# Patient Record
Sex: Female | Born: 1935
Health system: Southern US, Community
[De-identification: ages and names within clinical notes are randomized; demographics above are authoritative.]

## PROBLEM LIST (undated history)

## (undated) DIAGNOSIS — I10 Essential (primary) hypertension: Secondary | ICD-10-CM

## (undated) DIAGNOSIS — Z7901 Long term (current) use of anticoagulants: Secondary | ICD-10-CM

## (undated) DIAGNOSIS — F419 Anxiety disorder, unspecified: Secondary | ICD-10-CM

## (undated) DIAGNOSIS — F0390 Unspecified dementia without behavioral disturbance: Secondary | ICD-10-CM

## (undated) DIAGNOSIS — I4821 Permanent atrial fibrillation: Secondary | ICD-10-CM

## (undated) DIAGNOSIS — I442 Atrioventricular block, complete: Secondary | ICD-10-CM

## (undated) DIAGNOSIS — H3530 Unspecified macular degeneration: Secondary | ICD-10-CM

## (undated) DIAGNOSIS — F32A Depression, unspecified: Secondary | ICD-10-CM

## (undated) DIAGNOSIS — Z950 Presence of cardiac pacemaker: Secondary | ICD-10-CM

## (undated) DIAGNOSIS — E0590 Thyrotoxicosis, unspecified without thyrotoxic crisis or storm: Secondary | ICD-10-CM

## (undated) HISTORY — PX: APPENDECTOMY: SHX54

## (undated) HISTORY — DX: Essential (primary) hypertension: I10

## (undated) HISTORY — DX: Unspecified macular degeneration: H35.30

## (undated) HISTORY — PX: TOTAL ABDOMINAL HYSTERECTOMY W/ BILATERAL SALPINGOOPHORECTOMY: SHX83

## (undated) HISTORY — DX: Long term (current) use of anticoagulants: Z79.01

## (undated) HISTORY — DX: Thyrotoxicosis, unspecified without thyrotoxic crisis or storm: E05.90

## (undated) HISTORY — PX: ABDOMINAL HYSTERECTOMY: SHX81

---

## 1991-01-09 DIAGNOSIS — I4891 Unspecified atrial fibrillation: Secondary | ICD-10-CM

## 1991-01-09 HISTORY — DX: Unspecified atrial fibrillation: I48.91

## 1998-01-08 DIAGNOSIS — E0590 Thyrotoxicosis, unspecified without thyrotoxic crisis or storm: Secondary | ICD-10-CM

## 1998-01-08 HISTORY — DX: Thyrotoxicosis, unspecified without thyrotoxic crisis or storm: E05.90

## 2000-10-01 ENCOUNTER — Encounter: Payer: Self-pay | Admitting: Family Medicine

## 2000-10-01 ENCOUNTER — Ambulatory Visit (HOSPITAL_COMMUNITY): Admission: RE | Admit: 2000-10-01 | Discharge: 2000-10-01 | Payer: Self-pay | Admitting: Family Medicine

## 2001-03-20 ENCOUNTER — Ambulatory Visit (HOSPITAL_COMMUNITY): Admission: RE | Admit: 2001-03-20 | Discharge: 2001-03-20 | Payer: Self-pay | Admitting: General Surgery

## 2001-10-07 ENCOUNTER — Ambulatory Visit (HOSPITAL_COMMUNITY): Admission: RE | Admit: 2001-10-07 | Discharge: 2001-10-07 | Payer: Self-pay | Admitting: Family Medicine

## 2001-10-07 ENCOUNTER — Encounter: Payer: Self-pay | Admitting: Family Medicine

## 2001-10-16 ENCOUNTER — Encounter: Payer: Self-pay | Admitting: Family Medicine

## 2001-10-16 ENCOUNTER — Ambulatory Visit (HOSPITAL_COMMUNITY): Admission: RE | Admit: 2001-10-16 | Discharge: 2001-10-16 | Payer: Self-pay | Admitting: Family Medicine

## 2002-08-04 ENCOUNTER — Ambulatory Visit (HOSPITAL_COMMUNITY): Admission: RE | Admit: 2002-08-04 | Discharge: 2002-08-04 | Payer: Self-pay | Admitting: Family Medicine

## 2002-08-04 ENCOUNTER — Encounter: Payer: Self-pay | Admitting: Family Medicine

## 2002-09-07 ENCOUNTER — Encounter (HOSPITAL_COMMUNITY): Admission: RE | Admit: 2002-09-07 | Discharge: 2002-10-07 | Payer: Self-pay | Admitting: Family Medicine

## 2002-10-09 ENCOUNTER — Encounter (HOSPITAL_COMMUNITY): Admission: RE | Admit: 2002-10-09 | Discharge: 2002-11-08 | Payer: Self-pay | Admitting: Family Medicine

## 2002-11-06 ENCOUNTER — Ambulatory Visit (HOSPITAL_COMMUNITY): Admission: RE | Admit: 2002-11-06 | Discharge: 2002-11-06 | Payer: Self-pay | Admitting: Family Medicine

## 2003-01-09 HISTORY — PX: COLONOSCOPY: SHX174

## 2003-09-23 ENCOUNTER — Ambulatory Visit (HOSPITAL_COMMUNITY): Admission: RE | Admit: 2003-09-23 | Discharge: 2003-09-23 | Payer: Self-pay | Admitting: Internal Medicine

## 2003-12-15 ENCOUNTER — Ambulatory Visit: Payer: Self-pay | Admitting: *Deleted

## 2004-01-05 ENCOUNTER — Ambulatory Visit: Payer: Self-pay | Admitting: *Deleted

## 2004-02-02 ENCOUNTER — Ambulatory Visit: Payer: Self-pay | Admitting: Cardiology

## 2004-02-17 ENCOUNTER — Ambulatory Visit: Payer: Self-pay | Admitting: *Deleted

## 2004-03-16 ENCOUNTER — Ambulatory Visit: Payer: Self-pay | Admitting: *Deleted

## 2004-04-14 ENCOUNTER — Ambulatory Visit: Payer: Self-pay | Admitting: *Deleted

## 2004-05-05 ENCOUNTER — Ambulatory Visit: Payer: Self-pay | Admitting: Cardiology

## 2004-06-01 ENCOUNTER — Ambulatory Visit: Payer: Self-pay | Admitting: *Deleted

## 2004-06-12 ENCOUNTER — Ambulatory Visit: Payer: Self-pay | Admitting: Orthopedic Surgery

## 2004-07-03 ENCOUNTER — Ambulatory Visit: Payer: Self-pay | Admitting: *Deleted

## 2004-07-06 ENCOUNTER — Ambulatory Visit: Payer: Self-pay | Admitting: Orthopedic Surgery

## 2004-08-03 ENCOUNTER — Ambulatory Visit: Payer: Self-pay | Admitting: Cardiology

## 2004-09-05 ENCOUNTER — Ambulatory Visit: Payer: Self-pay | Admitting: Cardiology

## 2004-10-05 ENCOUNTER — Ambulatory Visit: Payer: Self-pay | Admitting: *Deleted

## 2004-10-26 ENCOUNTER — Ambulatory Visit: Payer: Self-pay | Admitting: Cardiology

## 2004-11-27 ENCOUNTER — Ambulatory Visit: Payer: Self-pay | Admitting: Cardiology

## 2004-12-25 ENCOUNTER — Ambulatory Visit: Payer: Self-pay | Admitting: *Deleted

## 2005-02-01 ENCOUNTER — Ambulatory Visit: Payer: Self-pay | Admitting: Cardiology

## 2005-02-23 ENCOUNTER — Ambulatory Visit: Payer: Self-pay | Admitting: Cardiology

## 2005-03-26 ENCOUNTER — Ambulatory Visit: Payer: Self-pay | Admitting: Internal Medicine

## 2005-04-30 ENCOUNTER — Ambulatory Visit: Payer: Self-pay | Admitting: *Deleted

## 2005-05-28 ENCOUNTER — Ambulatory Visit: Payer: Self-pay | Admitting: *Deleted

## 2005-07-02 ENCOUNTER — Ambulatory Visit: Payer: Self-pay | Admitting: *Deleted

## 2005-08-07 ENCOUNTER — Ambulatory Visit: Payer: Self-pay | Admitting: *Deleted

## 2005-09-05 ENCOUNTER — Ambulatory Visit: Payer: Self-pay | Admitting: *Deleted

## 2005-09-18 ENCOUNTER — Ambulatory Visit (HOSPITAL_COMMUNITY): Admission: RE | Admit: 2005-09-18 | Discharge: 2005-09-18 | Payer: Self-pay | Admitting: Family Medicine

## 2005-10-03 ENCOUNTER — Ambulatory Visit: Payer: Self-pay | Admitting: Cardiology

## 2005-11-08 ENCOUNTER — Ambulatory Visit: Payer: Self-pay | Admitting: Cardiology

## 2005-12-06 ENCOUNTER — Ambulatory Visit: Payer: Self-pay | Admitting: Cardiology

## 2006-01-03 ENCOUNTER — Ambulatory Visit: Payer: Self-pay | Admitting: Cardiology

## 2006-02-07 ENCOUNTER — Ambulatory Visit: Payer: Self-pay | Admitting: Cardiology

## 2006-02-11 ENCOUNTER — Ambulatory Visit (HOSPITAL_COMMUNITY): Admission: RE | Admit: 2006-02-11 | Discharge: 2006-02-11 | Payer: Self-pay | Admitting: Family Medicine

## 2006-03-19 ENCOUNTER — Ambulatory Visit: Payer: Self-pay | Admitting: Cardiology

## 2006-04-18 ENCOUNTER — Ambulatory Visit: Payer: Self-pay | Admitting: Cardiology

## 2006-05-20 ENCOUNTER — Ambulatory Visit: Payer: Self-pay | Admitting: Cardiology

## 2006-06-25 ENCOUNTER — Ambulatory Visit: Payer: Self-pay | Admitting: Cardiology

## 2006-07-09 ENCOUNTER — Ambulatory Visit: Payer: Self-pay | Admitting: Cardiology

## 2006-08-08 ENCOUNTER — Ambulatory Visit: Payer: Self-pay | Admitting: Cardiology

## 2006-08-22 ENCOUNTER — Ambulatory Visit: Payer: Self-pay | Admitting: Cardiology

## 2006-09-19 ENCOUNTER — Ambulatory Visit: Payer: Self-pay | Admitting: Cardiology

## 2006-10-11 ENCOUNTER — Ambulatory Visit: Payer: Self-pay | Admitting: Cardiology

## 2006-10-31 ENCOUNTER — Ambulatory Visit: Payer: Self-pay | Admitting: Cardiology

## 2006-11-14 ENCOUNTER — Ambulatory Visit: Payer: Self-pay | Admitting: Cardiology

## 2006-11-29 ENCOUNTER — Ambulatory Visit: Payer: Self-pay | Admitting: Cardiology

## 2006-12-12 ENCOUNTER — Ambulatory Visit: Payer: Self-pay | Admitting: Cardiology

## 2007-01-16 ENCOUNTER — Ambulatory Visit: Payer: Self-pay | Admitting: Internal Medicine

## 2007-02-18 ENCOUNTER — Ambulatory Visit: Payer: Self-pay | Admitting: Cardiology

## 2007-02-25 ENCOUNTER — Ambulatory Visit (HOSPITAL_COMMUNITY): Admission: RE | Admit: 2007-02-25 | Discharge: 2007-02-25 | Payer: Self-pay | Admitting: Family Medicine

## 2007-03-18 ENCOUNTER — Ambulatory Visit: Payer: Self-pay | Admitting: Cardiology

## 2007-04-03 ENCOUNTER — Ambulatory Visit: Payer: Self-pay | Admitting: Cardiology

## 2007-04-17 ENCOUNTER — Ambulatory Visit: Payer: Self-pay | Admitting: Internal Medicine

## 2007-05-07 ENCOUNTER — Ambulatory Visit: Payer: Self-pay | Admitting: Cardiology

## 2007-05-08 ENCOUNTER — Encounter (INDEPENDENT_AMBULATORY_CARE_PROVIDER_SITE_OTHER): Payer: Self-pay | Admitting: *Deleted

## 2007-05-08 LAB — CONVERTED CEMR LAB
Alkaline Phosphatase: 55 units/L
CO2: 26 meq/L
Calcium: 9.6 mg/dL
Chloride: 104 meq/L
Creatinine, Ser: 0.93 mg/dL
MCV: 93.9 fL
Platelets: 211 10*3/uL
Potassium: 5 meq/L
Total Protein: 7.1 g/dL

## 2007-05-16 ENCOUNTER — Ambulatory Visit: Payer: Self-pay | Admitting: Cardiology

## 2007-06-05 ENCOUNTER — Ambulatory Visit: Payer: Self-pay | Admitting: Cardiology

## 2007-07-03 ENCOUNTER — Ambulatory Visit: Payer: Self-pay | Admitting: Cardiology

## 2007-07-31 ENCOUNTER — Ambulatory Visit: Payer: Self-pay | Admitting: Cardiology

## 2007-09-04 ENCOUNTER — Ambulatory Visit: Payer: Self-pay | Admitting: Cardiology

## 2007-10-02 ENCOUNTER — Ambulatory Visit: Payer: Self-pay | Admitting: Cardiology

## 2007-10-16 ENCOUNTER — Ambulatory Visit: Payer: Self-pay | Admitting: Cardiology

## 2007-11-13 ENCOUNTER — Ambulatory Visit: Payer: Self-pay | Admitting: Cardiology

## 2007-12-08 ENCOUNTER — Ambulatory Visit: Payer: Self-pay | Admitting: Cardiology

## 2007-12-22 ENCOUNTER — Ambulatory Visit: Payer: Self-pay | Admitting: Cardiology

## 2007-12-29 ENCOUNTER — Ambulatory Visit: Payer: Self-pay | Admitting: Cardiology

## 2008-01-19 ENCOUNTER — Ambulatory Visit: Payer: Self-pay | Admitting: Cardiology

## 2008-01-21 ENCOUNTER — Emergency Department (HOSPITAL_COMMUNITY): Admission: EM | Admit: 2008-01-21 | Discharge: 2008-01-21 | Payer: Self-pay | Admitting: Emergency Medicine

## 2008-01-21 ENCOUNTER — Encounter: Payer: Self-pay | Admitting: Cardiology

## 2008-01-21 LAB — CONVERTED CEMR LAB
Albumin: 3.7 g/dL
BUN: 10 mg/dL
Calcium: 9.4 mg/dL
Chloride: 103 meq/L
Creatinine, Ser: 0.8 mg/dL
Glucose, Bld: 93 mg/dL
HCT: 39.5 %
Hemoglobin: 13.2 g/dL
Platelets: 185 10*3/uL
Potassium: 3.2 meq/L
Prothrombin Time: 28.7 s

## 2008-01-22 ENCOUNTER — Ambulatory Visit (HOSPITAL_COMMUNITY): Admission: RE | Admit: 2008-01-22 | Discharge: 2008-01-22 | Payer: Self-pay | Admitting: Family Medicine

## 2008-01-22 ENCOUNTER — Encounter: Payer: Self-pay | Admitting: Family Medicine

## 2008-02-03 ENCOUNTER — Encounter: Payer: Self-pay | Admitting: Physician Assistant

## 2008-02-03 ENCOUNTER — Ambulatory Visit: Payer: Self-pay | Admitting: Cardiology

## 2008-02-04 ENCOUNTER — Encounter: Payer: Self-pay | Admitting: Cardiology

## 2008-02-04 ENCOUNTER — Ambulatory Visit (HOSPITAL_COMMUNITY): Admission: RE | Admit: 2008-02-04 | Discharge: 2008-02-04 | Payer: Self-pay | Admitting: Cardiology

## 2008-02-04 ENCOUNTER — Ambulatory Visit: Payer: Self-pay | Admitting: Cardiovascular Disease

## 2008-02-16 ENCOUNTER — Ambulatory Visit: Payer: Self-pay | Admitting: Cardiology

## 2008-02-17 ENCOUNTER — Ambulatory Visit: Payer: Self-pay | Admitting: Internal Medicine

## 2008-02-25 ENCOUNTER — Ambulatory Visit (HOSPITAL_COMMUNITY): Admission: RE | Admit: 2008-02-25 | Discharge: 2008-02-25 | Payer: Self-pay | Admitting: Internal Medicine

## 2008-03-15 ENCOUNTER — Ambulatory Visit: Payer: Self-pay | Admitting: Cardiology

## 2008-03-31 ENCOUNTER — Ambulatory Visit (HOSPITAL_COMMUNITY): Admission: RE | Admit: 2008-03-31 | Discharge: 2008-03-31 | Payer: Self-pay | Admitting: Family Medicine

## 2008-04-12 ENCOUNTER — Ambulatory Visit: Payer: Self-pay | Admitting: Cardiology

## 2008-05-20 ENCOUNTER — Ambulatory Visit: Payer: Self-pay | Admitting: Cardiology

## 2008-06-17 ENCOUNTER — Ambulatory Visit: Payer: Self-pay | Admitting: Cardiology

## 2008-07-15 ENCOUNTER — Ambulatory Visit: Payer: Self-pay | Admitting: Cardiology

## 2008-08-19 ENCOUNTER — Ambulatory Visit: Payer: Self-pay | Admitting: Cardiology

## 2008-08-19 ENCOUNTER — Encounter: Payer: Self-pay | Admitting: Cardiology

## 2008-08-23 ENCOUNTER — Encounter: Payer: Self-pay | Admitting: *Deleted

## 2008-09-16 ENCOUNTER — Ambulatory Visit: Payer: Self-pay | Admitting: Cardiovascular Disease

## 2008-09-16 LAB — CONVERTED CEMR LAB: POC INR: 3.5

## 2008-10-07 ENCOUNTER — Ambulatory Visit: Payer: Self-pay | Admitting: Cardiology

## 2008-11-04 ENCOUNTER — Ambulatory Visit: Payer: Self-pay | Admitting: Cardiology

## 2008-11-11 ENCOUNTER — Encounter: Payer: Self-pay | Admitting: Cardiology

## 2008-12-06 ENCOUNTER — Ambulatory Visit: Payer: Self-pay | Admitting: Cardiology

## 2008-12-06 LAB — CONVERTED CEMR LAB: POC INR: 2.7

## 2009-01-10 ENCOUNTER — Ambulatory Visit: Payer: Self-pay | Admitting: Cardiology

## 2009-01-10 LAB — CONVERTED CEMR LAB: POC INR: 2

## 2009-02-10 ENCOUNTER — Ambulatory Visit: Payer: Self-pay | Admitting: Cardiology

## 2009-03-10 ENCOUNTER — Ambulatory Visit: Payer: Self-pay | Admitting: Cardiology

## 2009-03-10 LAB — CONVERTED CEMR LAB: POC INR: 2.2

## 2009-04-11 ENCOUNTER — Ambulatory Visit: Payer: Self-pay | Admitting: Cardiology

## 2009-04-11 LAB — CONVERTED CEMR LAB: POC INR: 2.4

## 2009-04-14 ENCOUNTER — Ambulatory Visit (HOSPITAL_COMMUNITY): Admission: RE | Admit: 2009-04-14 | Discharge: 2009-04-14 | Payer: Self-pay | Admitting: Family Medicine

## 2009-04-25 ENCOUNTER — Telehealth (INDEPENDENT_AMBULATORY_CARE_PROVIDER_SITE_OTHER): Payer: Self-pay | Admitting: *Deleted

## 2009-04-27 ENCOUNTER — Ambulatory Visit (HOSPITAL_COMMUNITY): Admission: RE | Admit: 2009-04-27 | Discharge: 2009-04-27 | Payer: Self-pay | Admitting: Family Medicine

## 2009-05-09 ENCOUNTER — Ambulatory Visit: Payer: Self-pay | Admitting: Cardiology

## 2009-05-24 ENCOUNTER — Encounter (INDEPENDENT_AMBULATORY_CARE_PROVIDER_SITE_OTHER): Payer: Self-pay | Admitting: *Deleted

## 2009-06-02 ENCOUNTER — Encounter (INDEPENDENT_AMBULATORY_CARE_PROVIDER_SITE_OTHER): Payer: Self-pay | Admitting: *Deleted

## 2009-06-07 ENCOUNTER — Encounter (INDEPENDENT_AMBULATORY_CARE_PROVIDER_SITE_OTHER): Payer: Self-pay | Admitting: *Deleted

## 2009-06-08 ENCOUNTER — Encounter: Payer: Self-pay | Admitting: Cardiology

## 2009-06-13 ENCOUNTER — Ambulatory Visit: Payer: Self-pay | Admitting: Cardiology

## 2009-06-13 LAB — CONVERTED CEMR LAB: POC INR: 2.5

## 2009-06-14 ENCOUNTER — Encounter (INDEPENDENT_AMBULATORY_CARE_PROVIDER_SITE_OTHER): Payer: Self-pay | Admitting: *Deleted

## 2009-06-14 LAB — CONVERTED CEMR LAB
AST: 27 units/L (ref 0–37)
Alkaline Phosphatase: 56 units/L (ref 39–117)
BUN: 10 mg/dL (ref 6–23)
Basophils Absolute: 0 10*3/uL (ref 0.0–0.1)
CO2: 27 meq/L (ref 19–32)
Chloride: 99 meq/L (ref 96–112)
Creatinine, Ser: 0.89 mg/dL (ref 0.40–1.20)
HCT: 43.5 % (ref 36.0–46.0)
Hemoglobin: 14 g/dL (ref 12.0–15.0)
Lymphocytes Relative: 32 % (ref 12–46)
Lymphs Abs: 1.9 10*3/uL (ref 0.7–4.0)
MCHC: 32.2 g/dL (ref 30.0–36.0)
MCV: 92.9 fL (ref 78.0–100.0)
Monocytes Relative: 8 % (ref 3–12)
Neutro Abs: 3.4 10*3/uL (ref 1.7–7.7)
Neutrophils Relative %: 58 % (ref 43–77)
Platelets: 205 10*3/uL (ref 150–400)
Potassium: 5.2 meq/L (ref 3.5–5.3)
Total Bilirubin: 0.8 mg/dL (ref 0.3–1.2)
WBC: 5.8 10*3/uL (ref 4.0–10.5)

## 2009-06-17 ENCOUNTER — Encounter (INDEPENDENT_AMBULATORY_CARE_PROVIDER_SITE_OTHER): Payer: Self-pay | Admitting: *Deleted

## 2009-06-17 ENCOUNTER — Ambulatory Visit: Payer: Self-pay | Admitting: Cardiology

## 2009-06-17 LAB — CONVERTED CEMR LAB: OCCULT 3: NEGATIVE

## 2009-06-20 ENCOUNTER — Encounter (INDEPENDENT_AMBULATORY_CARE_PROVIDER_SITE_OTHER): Payer: Self-pay | Admitting: *Deleted

## 2009-06-27 ENCOUNTER — Telehealth (INDEPENDENT_AMBULATORY_CARE_PROVIDER_SITE_OTHER): Payer: Self-pay | Admitting: *Deleted

## 2009-07-13 ENCOUNTER — Ambulatory Visit: Payer: Self-pay | Admitting: Cardiovascular Disease

## 2009-07-13 ENCOUNTER — Encounter: Payer: Self-pay | Admitting: Cardiovascular Disease

## 2009-07-13 ENCOUNTER — Encounter: Payer: Self-pay | Admitting: Cardiology

## 2009-07-13 LAB — CONVERTED CEMR LAB
Calcium: 9.7 mg/dL (ref 8.4–10.5)
Creatinine, Ser: 0.86 mg/dL (ref 0.40–1.20)

## 2009-08-11 ENCOUNTER — Ambulatory Visit: Payer: Self-pay | Admitting: Cardiology

## 2009-08-11 LAB — CONVERTED CEMR LAB: POC INR: 3.1

## 2009-09-08 ENCOUNTER — Ambulatory Visit: Payer: Self-pay | Admitting: Cardiology

## 2009-09-13 ENCOUNTER — Encounter (INDEPENDENT_AMBULATORY_CARE_PROVIDER_SITE_OTHER): Payer: Self-pay | Admitting: *Deleted

## 2009-09-13 LAB — CONVERTED CEMR LAB
BUN: 14 mg/dL
BUN: 14 mg/dL (ref 6–23)
CO2: 29 meq/L
CO2: 29 meq/L (ref 19–32)
Calcium: 9.2 mg/dL
Chloride: 101 meq/L
Chloride: 101 meq/L (ref 96–112)
Glucose, Bld: 94 mg/dL (ref 70–99)
Potassium: 4.3 meq/L (ref 3.5–5.3)
Sodium: 139 meq/L

## 2009-09-14 ENCOUNTER — Encounter (INDEPENDENT_AMBULATORY_CARE_PROVIDER_SITE_OTHER): Payer: Self-pay | Admitting: *Deleted

## 2009-09-19 ENCOUNTER — Encounter (INDEPENDENT_AMBULATORY_CARE_PROVIDER_SITE_OTHER): Payer: Self-pay | Admitting: *Deleted

## 2009-10-13 ENCOUNTER — Ambulatory Visit: Payer: Self-pay | Admitting: Cardiology

## 2009-11-10 ENCOUNTER — Ambulatory Visit: Payer: Self-pay | Admitting: Cardiology

## 2009-11-10 LAB — CONVERTED CEMR LAB: POC INR: 1.9

## 2009-12-12 ENCOUNTER — Ambulatory Visit: Payer: Self-pay | Admitting: Cardiology

## 2009-12-12 LAB — CONVERTED CEMR LAB: POC INR: 1.9

## 2010-01-11 ENCOUNTER — Ambulatory Visit: Admission: RE | Admit: 2010-01-11 | Discharge: 2010-01-11 | Payer: Self-pay | Source: Home / Self Care

## 2010-01-11 LAB — CONVERTED CEMR LAB: POC INR: 2.2

## 2010-02-07 NOTE — Medication Information (Signed)
Summary: ccr-lr  Anticoagulant Therapy  Managed by: Vashti Hey, RN PCP: [facility] Supervising MD: Daleen Squibb MD, Maisie Fus Indication 1: Atrial Fibrillation (ICD-427.31) Lab Used: [facility] [place] Site: [place] INR POC 1.9  Dietary changes: no    Health status changes: no    Bleeding/hemorrhagic complications: no    Recent/future hospitalizations: no    Any changes in medication regimen? no    Recent/future dental: no  Any missed doses?: no       Is patient compliant with meds? yes       Allergies: No Known Drug Allergies  Anticoagulation Management History:      The patient is taking warfarin and comes in today for a routine follow up visit.  Positive risk factors for bleeding include an age of 16 years or older.  The bleeding index is 'intermediate risk'.  Positive CHADS2 values include History of HTN and Age > 60 years old.  The start date was 05/20/1998.  Her last INR was 2.5.  Anticoagulation responsible provider: Daleen Squibb MD, Maisie Fus.  INR POC: 1.9.  Cuvette Lot#: [id number].  Exp: 10/11.    Anticoagulation Management Assessment/Plan:      The patient's current anticoagulation dose is Coumadin 5 mg tabs: take 1 tablet by mouth once daily as directed by Coumadin clinic.  The target INR is 2 - 3.  The next INR is due 01/11/2010.  Anticoagulation instructions were given to patient.  Results were reviewed/authorized by Vashti Hey, RN.  She was notified by Vashti Hey RN.         Prior Anticoagulation Instructions: INR 1.9 Take coumadin 1 1/2 tablets tonight then resume 1 tablet once daily except 1 1/2 tablets on Tuesdays and Saturdays  Current Anticoagulation Instructions: INR 1.9 Increase coumadin to 5mg  once daily except 7.5mg  on Mondays, Wednesdays and Fridays

## 2010-02-07 NOTE — Miscellaneous (Signed)
Summary: labs bmp 09/13/2009  Clinical Lists Changes  Observations: Added new observation of CALCIUM: 9.2 mg/dL (16/10/9602 5:40) Added new observation of CREATININE: 0.97 mg/dL (98/11/9145 8:29) Added new observation of BUN: 14 mg/dL (56/21/3086 5:78) Added new observation of BG RANDOM: 94 mg/dL (46/96/2952 8:41) Added new observation of CO2 PLSM/SER: 29 meq/L (09/13/2009 8:06) Added new observation of CL SERUM: 101 meq/L (09/13/2009 8:06) Added new observation of K SERUM: 4.3 meq/L (09/13/2009 8:06) Added new observation of NA: 139 meq/L (09/13/2009 8:06)

## 2010-02-07 NOTE — Medication Information (Signed)
Summary: ccr-lr  Anticoagulant Therapy  Managed by: Vashti Hey, RN Supervising MD: Dietrich Pates MD, Molly Maduro Indication 1: Atrial Fibrillation (ICD-427.31) Lab Used: [facility] [place] Site: [place] INR POC 2.7  Dietary changes: no    Health status changes: no    Bleeding/hemorrhagic complications: no    Recent/future hospitalizations: no    Any changes in medication regimen? no    Recent/future dental: no  Any missed doses?: no       Is patient compliant with meds? yes       Anticoagulation Management History:      The patient is taking warfarin and comes in today for a routine follow up visit.  Positive risk factors for bleeding include an age of 75 years or older.  The bleeding index is 'intermediate risk'.  Positive CHADS2 values include History of HTN and Age > 89 years old.  The start date was 05/20/1998.  Anticoagulation responsible provider: Dietrich Pates MD, Molly Maduro.  INR POC: 2.7.  Cuvette Lot#: [id number].  Exp: 10/11.    Anticoagulation Management Assessment/Plan:      The patient's current anticoagulation dose is Coumadin 5 mg tabs: take 1 tablet by mouth once daily as directed by Coumadin clinic.  The target INR is 2 - 3.  The next INR is due 03/10/2009.  Anticoagulation instructions were given to patient.  Results were reviewed/authorized by Vashti Hey, RN.  She was notified by Vashti Hey RN.         Prior Anticoagulation Instructions: INR 2.0 Continue coumadin 5mg  once daily except 7.5mg  on Tuesdays and Saturdays  Current Anticoagulation Instructions: INR 2.7 Continue coumadin 5mg  once daily except 7.5mg  on Tuesdays and Saturdays

## 2010-02-07 NOTE — Miscellaneous (Signed)
Summary: HEMOCCULT CARDS 06/17/2009  Clinical Lists Changes  Observations: Added new observation of HEMOCCULT 3: NEG (06/17/2009 16:27) Added new observation of HEMOCCULT 2: NEG (06/17/2009 16:27) Added new observation of HEMOCCULT 1: NEG (06/17/2009 16:27)

## 2010-02-07 NOTE — Assessment & Plan Note (Signed)
Summary: NURSE VISIT/TMJ  Nurse Visit   Vital Signs:  Patient profile:   77 year old female Height:      65 inches Weight:      128 pounds O2 Sat:      95 % on Room air Pulse rate:   67 / minute BP sitting:   118 / 77  (left arm)  Vitals Entered By: Teressa Lower RN (July 13, 2009 1:44 PM)  O2 Flow:  Room air  Visit Type:  3 week  nurse visit Primary Provider:  [facility]   History of Present Illness: S; 3 week nurse visit B: last seen 07/13/09, started lisinopril 20mg  daily,bp diary A: denies complaints, bp diary 14 readings average sbp 125, average dbp 79, average hr  75 bmp signed by RR, no new orders R:   Current Medications (verified): 1)  Coumadin 5 Mg Tabs (Warfarin Sodium) .... Take 1 Tablet By Mouth Once Daily As Directed By Coumadin Clinic 2)  Metoprolol Succinate 25 Mg Xr24h-Tab (Metoprolol Succinate) .... Take One Tablet By Mouth Daily 3)  Alprazolam 0.5 Mg Tabs (Alprazolam) .... Take As Needed 4)  Centrum Silver  Tabs (Multiple Vitamins-Minerals) .... Take 1 Tab Daily 5)  Vitamin C Cr 500 Mg Cr-Caps (Ascorbic Acid) .... Take 1 Tab Daily 6)  Fish Oil 1000 Mg Caps (Omega-3 Fatty Acids) .... Take 1 Cap Daily 7)  Lisinopril 20 Mg Tabs (Lisinopril) .... Take One Tablet By Mouth Daily  Allergies (verified): No Known Drug Allergies

## 2010-02-07 NOTE — Medication Information (Signed)
Summary: ccr-lr  Anticoagulant Therapy  Managed by: Vashti Hey, RN PCP: [facility] Supervising MD: Eden Emms MD, Theron Arista Indication 1: Atrial Fibrillation (ICD-427.31) Lab Used: [facility] [place] Site: [place] INR POC 2.3  Dietary changes: no    Health status changes: no    Bleeding/hemorrhagic complications: no    Recent/future hospitalizations: no    Any changes in medication regimen? no    Recent/future dental: no  Any missed doses?: no       Is patient compliant with meds? yes       Allergies: No Known Drug Allergies  Anticoagulation Management History:      The patient is taking warfarin and comes in today for a routine follow up visit.  Positive risk factors for bleeding include an age of 75 years or older.  The bleeding index is 'intermediate risk'.  Positive CHADS2 values include History of HTN and Age > 10 years old.  The start date was 05/20/1998.  Her last INR was 2.5.  Anticoagulation responsible provider: Eden Emms MD, Theron Arista.  INR POC: 2.3.  Cuvette Lot#: [id number].  Exp: 10/11.    Anticoagulation Management Assessment/Plan:      The patient's current anticoagulation dose is Coumadin 5 mg tabs: take 1 tablet by mouth once daily as directed by Coumadin clinic.  The target INR is 2 - 3.  The next INR is due 08/11/2009.  Anticoagulation instructions were given to patient.  Results were reviewed/authorized by Vashti Hey, RN.  She was notified by Vashti Hey RN.         Prior Anticoagulation Instructions: INR 2.5 Continue coumadin 5mg  once daily except 7.5mg  on Tuesdays and Saturdays  Current Anticoagulation Instructions: INR 2.3 Continue coumadin 5mg  once daily except 7.5mg  on Tuesdays and Saturdays

## 2010-02-07 NOTE — Letter (Signed)
Summary: [place] [facility] [employer]  [address] [place] [address]   Phone: [phone]  Fax: [phone]     June 14, 2009 MRN: [id number]   Renee Cameron [address] [place], [place]  [address]      YOUR LAB WORK IS DUE   ______July 19, 2011___________________________________  Please go to [facility], located across the street from [facility] on the second floor.  Hours are Monday - Friday 7am until 7:30pm         Saturday 8am until 12noon    __  DO NOT EAT OR DRINK AFTER MIDNIGHT EVENING PRIOR TO LABWORK  _x_ YOUR LABWORK IS NOT FASTING --YOU MAY EAT PRIOR TO LABWORK

## 2010-02-07 NOTE — Medication Information (Signed)
Summary: CCR  Anticoagulant Therapy  Managed by: Vashti Hey, RN Supervising MD: Diona Browner MD, Remi Deter Indication 1: Atrial Fibrillation (ICD-427.31) Lab Used: [facility] [place] Site: [place] INR POC 2.2  Dietary changes: no    Health status changes: no    Bleeding/hemorrhagic complications: no    Recent/future hospitalizations: no    Any changes in medication regimen? no    Recent/future dental: no  Any missed doses?: no       Is patient compliant with meds? yes       Anticoagulation Management History:      The patient is taking warfarin and comes in today for a routine follow up visit.  Positive risk factors for bleeding include an age of 75 years or older.  The bleeding index is 'intermediate risk'.  Positive CHADS2 values include History of HTN and Age > 34 years old.  The start date was 05/20/1998.  Anticoagulation responsible provider: Diona Browner MD, Remi Deter.  INR POC: 2.2.  Cuvette Lot#: [id number].  Exp: 10/11.    Anticoagulation Management Assessment/Plan:      The patient's current anticoagulation dose is Coumadin 5 mg tabs: take 1 tablet by mouth once daily as directed by Coumadin clinic.  The target INR is 2 - 3.  The next INR is due 04/11/2009.  Anticoagulation instructions were given to patient.  Results were reviewed/authorized by Vashti Hey, RN.  She was notified by Vashti Hey RN.         Prior Anticoagulation Instructions: INR 2.7 Continue coumadin 5mg  once daily except 7.5mg  on Tuesdays and Saturdays  Current Anticoagulation Instructions: INR 2.2 Continue coumadin 5mg  once daily except 7.5mg  on Tuesdays and Saturdays

## 2010-02-07 NOTE — Medication Information (Signed)
Summary: ccr-lr   BP MUST BE TAKEN MANUALLY-NO AUTOMATIC CUFF  Anticoagulant Therapy  Managed by: Vashti Hey, RN PCP: Dr. Patrica Duel Supervising MD: Dietrich Pates MD, Molly Maduro Indication 1: Atrial Fibrillation (ICD-427.31) Lab Used: [facility] [place] Site: [place] INR POC 2.5  Dietary changes: no    Health status changes: no    Bleeding/hemorrhagic complications: no    Recent/future hospitalizations: no    Any changes in medication regimen? yes       Details: been on cefelexin 500mg  qid for nail fungus  Recent/future dental: no  Any missed doses?: no       Is patient compliant with meds? yes       Anticoagulation Management History:      The patient is taking warfarin and comes in today for a routine follow up visit.  Positive risk factors for bleeding include an age of 75 years or older.  The bleeding index is 'intermediate risk'.  Positive CHADS2 values include History of HTN and Age > 50 years old.  The start date was 05/20/1998.  Her last INR was 2.5.  Anticoagulation responsible provider: Dietrich Pates MD, Molly Maduro.  INR POC: 2.5.  Cuvette Lot#: [id number].  Exp: 10/11.    Anticoagulation Management Assessment/Plan:      The patient's current anticoagulation dose is Coumadin 5 mg tabs: take 1 tablet by mouth once daily as directed by Coumadin clinic.  The target INR is 2 - 3.  The next INR is due 07/13/2009.  Anticoagulation instructions were given to patient.  Results were reviewed/authorized by Vashti Hey, RN.  She was notified by Vashti Hey RN.         Prior Anticoagulation Instructions: INR 2.2 Continue coumadin 5mg  once daily except 7.5mg  on Tuesdays and Saturdays  Current Anticoagulation Instructions: INR 2.5 Continue coumadin 5mg  once daily except 7.5mg  on Tuesdays and Saturdays

## 2010-02-07 NOTE — Medication Information (Signed)
Summary: ccr-lr  Anticoagulant Therapy  Managed by: Vashti Hey, RN PCP: [facility] Supervising MD: Dietrich Pates MD, Molly Maduro Indication 1: Atrial Fibrillation (ICD-427.31) Lab Used: [facility] [place] Site: [place] INR POC 2.8  Dietary changes: no    Health status changes: no    Bleeding/hemorrhagic complications: no    Recent/future hospitalizations: no    Any changes in medication regimen? no    Recent/future dental: no  Any missed doses?: no       Is patient compliant with meds? yes       Allergies: No Known Drug Allergies  Anticoagulation Management History:      The patient is taking warfarin and comes in today for a routine follow up visit.  Positive risk factors for bleeding include an age of 75 years or older.  The bleeding index is 'intermediate risk'.  Positive CHADS2 values include History of HTN and Age > 72 years old.  The start date was 05/20/1998.  Her last INR was 2.5.  Anticoagulation responsible provider: Dietrich Pates MD, Molly Maduro.  INR POC: 2.8.  Cuvette Lot#: [id number].  Exp: 10/11.    Anticoagulation Management Assessment/Plan:      The patient's current anticoagulation dose is Coumadin 5 mg tabs: take 1 tablet by mouth once daily as directed by Coumadin clinic.  The target INR is 2 - 3.  The next INR is due 11/10/2009.  Anticoagulation instructions were given to patient.  Results were reviewed/authorized by Vashti Hey, RN.  She was notified by Vashti Hey RN.         Prior Anticoagulation Instructions: INR 2.6 Continue coumadin 5mg  once daily except 7.5mg  on Tuesdays and Saturdays  Current Anticoagulation Instructions: INR 2.8 Continue coumadin 5mg  once daily except 7.5mg  on Tuesdays and Saturdays

## 2010-02-07 NOTE — Medication Information (Signed)
Summary: ccr-lr  Anticoagulant Therapy  Managed by: Vashti Hey, RN PCP: [facility] Supervising MD: Dietrich Pates MD, Molly Maduro Indication 1: Atrial Fibrillation (ICD-427.31) Lab Used: [facility] [place] Site: [place] INR POC 2.6  Dietary changes: no    Health status changes: no    Bleeding/hemorrhagic complications: no    Recent/future hospitalizations: no    Any changes in medication regimen? no    Recent/future dental: no  Any missed doses?: no       Is patient compliant with meds? yes       Allergies: No Known Drug Allergies  Anticoagulation Management History:      The patient is taking warfarin and comes in today for a routine follow up visit.  Positive risk factors for bleeding include an age of 75 years or older.  The bleeding index is 'intermediate risk'.  Positive CHADS2 values include History of HTN and Age > 44 years old.  The start date was 05/20/1998.  Her last INR was 2.5.  Anticoagulation responsible provider: Dietrich Pates MD, Molly Maduro.  INR POC: 2.6.  Cuvette Lot#: [id number].  Exp: 10/11.    Anticoagulation Management Assessment/Plan:      The patient's current anticoagulation dose is Coumadin 5 mg tabs: take 1 tablet by mouth once daily as directed by Coumadin clinic.  The target INR is 2 - 3.  The next INR is due 10/13/2009.  Anticoagulation instructions were given to patient.  Results were reviewed/authorized by Vashti Hey, RN.  She was notified by Vashti Hey RN.         Prior Anticoagulation Instructions: INR 3.1 Take coumadin 1/2 tablet tonight then resume 1 tablet once daily except 1 1/2 tablets on Tuesdays and Saturdays Increase greens  Current Anticoagulation Instructions: INR 2.6 Continue coumadin 5mg  once daily except 7.5mg  on Tuesdays and Saturdays

## 2010-02-07 NOTE — Miscellaneous (Signed)
Summary: echo 02/04/2008  Clinical Lists Changes  Observations: Added new observation of ECHOINTERP:  SUMMARY   -  Overall left ventricular systolic function was normal. Left         ventricular ejection fraction was estimated to be 55 %. Left         ventricular wall thickness was mildly increased.   -  The aortic valve was mildly calcified.   -  There was mild mitral valvular regurgitation. The effective         orifice of mitral regurgitation by proximal isovelocity         surface area was 0.08 cm^2. The volume of mitral         regurgitation by proximal isovelocity surface area was 14 cc.   -  The left atrium was moderately to markedly dilated.   -  The right atrium was moderately dilated.     ---------------------------------------------------------------    Prepared and Electronically Authenticated by    Charlton Haws M.D.   Confirmed 04-Feb-2008 14:29:18 (02/04/2008 11:32)      Echocardiogram  Procedure date:  02/04/2008  Findings:       SUMMARY   -  Overall left ventricular systolic function was normal. Left         ventricular ejection fraction was estimated to be 55 %. Left         ventricular wall thickness was mildly increased.   -  The aortic valve was mildly calcified.   -  There was mild mitral valvular regurgitation. The effective         orifice of mitral regurgitation by proximal isovelocity         surface area was 0.08 cm^2. The volume of mitral         regurgitation by proximal isovelocity surface area was 14 cc.   -  The left atrium was moderately to markedly dilated.   -  The right atrium was moderately dilated.     ---------------------------------------------------------------    Prepared and Electronically Authenticated by    Charlton Haws M.D.   Confirmed 04-Feb-2008 14:29:18

## 2010-02-07 NOTE — Miscellaneous (Signed)
Summary: LOABS CBCD,CMP,LIPIDS4/30/2009  Clinical Lists Changes  Observations: Added new observation of CALCIUM: 9.6 mg/dL (16/10/9602 54:09) Added new observation of ALBUMIN: 4.5 g/dL (81/19/1478 29:56) Added new observation of PROTEIN, TOT: 7.1 g/dL (21/30/8657 84:69) Added new observation of SGPT (ALT): 16 units/L (05/08/2007 10:40) Added new observation of SGOT (AST): 18 units/L (05/08/2007 10:40) Added new observation of ALK PHOS: 55 units/L (05/08/2007 10:40) Added new observation of CREATININE: 0.93 mg/dL (62/95/2841 32:44) Added new observation of BUN: 10 mg/dL (01/10/7251 66:44) Added new observation of BG RANDOM: 98 mg/dL (03/47/4259 56:38) Added new observation of CO2 PLSM/SER: 26 meq/L (05/08/2007 10:40) Added new observation of CL SERUM: 104 meq/L (05/08/2007 10:40) Added new observation of K SERUM: 5.0 meq/L (05/08/2007 10:40) Added new observation of NA: 140 meq/L (05/08/2007 10:40) Added new observation of LDL: 149 mg/dL (75/64/3329 51:88) Added new observation of HDL: 45 mg/dL (41/66/0630 16:01) Added new observation of TRIGLYC TOT: 185 mg/dL (09/32/3557 32:20) Added new observation of CHOLESTEROL: 231 mg/dL (25/42/7062 37:62) Added new observation of PLATELETK/UL: 211 K/uL (05/08/2007 10:40) Added new observation of MCV: 93.9 fL (05/08/2007 10:40) Added new observation of HCT: 44.5 % (05/08/2007 10:40) Added new observation of HGB: 14.5 g/dL (83/15/1761 60:73) Added new observation of WBC COUNT: 5.2 10*3/microliter (05/08/2007 10:40)

## 2010-02-07 NOTE — Assessment & Plan Note (Signed)
Summary: PAST DUE FOR F/U PER Renee Cameron/TG  Medications Added ALPRAZOLAM 0.5 MG TABS (ALPRAZOLAM) TAKE AS NEEDED KEFLEX 250 MG CAPS (CEPHALEXIN) TAKE 1 TAB 4 TIMES DAILY CENTRUM SILVER  TABS (MULTIPLE VITAMINS-MINERALS) TAKE 1 TAB DAILY VITAMIN C CR 500 MG CR-CAPS (ASCORBIC ACID) TAKE 1 TAB DAILY FISH OIL 1000 MG CAPS (OMEGA-3 FATTY ACIDS) TAKE 1 CAP DAILY LISINOPRIL 20 MG TABS (LISINOPRIL) Take one tablet by mouth daily      Allergies Added: NKDA  Visit Type:  Follow-up Primary Provider:  [facility]   History of Present Illness: [name] returns to the office somewhat beyond her anticipated routine office visit, having been lost to followup from my office practice.  Anticoagulation has continued to be managed in our clinic with stable and therapeutic INRs and with no apparent complications.  Patient denies cardiopulmonary symptoms; specifically, she experiences no palpitations, chest discomfort, dyspnea, orthopnea, PND, lightheadedness or syncope.  Her only medical problem over the past 18 months has been what sounds like a paronychial infection, which has responded to a course of antibiotics.  Current Medications (verified): 1)  Coumadin 5 Mg Tabs (Warfarin Sodium) .... Take 1 Tablet By Mouth Once Daily As Directed By Coumadin Clinic 2)  Metoprolol Succinate 25 Mg Xr24h-Tab (Metoprolol Succinate) .... Take One Tablet By Mouth Daily 3)  Alprazolam 0.5 Mg Tabs (Alprazolam) .... Take As Needed 4)  Keflex 250 Mg Caps (Cephalexin) .... Take 1 Tab 4 Times Daily 5)  Centrum Silver  Tabs (Multiple Vitamins-Minerals) .... Take 1 Tab Daily 6)  Vitamin C Cr 500 Mg Cr-Caps (Ascorbic Acid) .... Take 1 Tab Daily 7)  Fish Oil 1000 Mg Caps (Omega-3 Fatty Acids) .... Take 1 Cap Daily 8)  Lisinopril 20 Mg Tabs (Lisinopril) .... Take One Tablet By Mouth Daily  Allergies (verified): No Known Drug Allergies  Past History:  PMH, FH, and Social History reviewed and updated.  Review  of Systems       See history of present illness.  Vital Signs:  Patient profile:   77 year old female Height:      65 inches Weight:      127 pounds BMI:     21.21 Pulse rate:   79 / minute BP sitting:   152 / 79  (right arm)  Vitals Entered By: Renee Saa, CNA (June 13, 2009 11:21 AM)  Physical Exam  General:    Proportionate weight and height; well developed; no acute distress:   Neck-No JVD; no carotid bruits: Lungs-No tachypnea, no rales; no rhonchi; no wheezes; mild kyphosis Cardiovascular-normal PMI; normal S1 and S2; irregular rhythm Abdomen-BS normal; soft and non-tender without masses or organomegaly:  Musculoskeletal-No deformities, no cyanosis or clubbing: Neurologic-Normal cranial nerves; symmetric strength and tone:  Skin-Warm, no significant lesions: Extremities-Nl distal pulses; no edema:     Impression & Recommendations:  Problem # 1:  ATRIAL FIBRILLATION (ICD-427.31) In general, Renee Cameron is doing outstandingly well with remarkably good health for her age.  She is asymptomatic with respect to atrial fibrillation.  Anticoagulation has been uncomplicated and effective.  A monitoring CBC and stool for Hemoccult testing will be obtained.  The [facility] physicians could consider screening colonoscopy, which she has not had for many years.  Problem # 2:  HYPERTENSION, UNSPECIFIED (ICD-401.9) Blood pressure control is somewhat suboptimal at this visit.  Due to constant atrial fibrillation, automatic blood pressure cuff determinations are not necessarily accurate.  Her device at home typically yields systolics in the 140s.  The value obtained by me today is closer to 165.  Lisinopril 20 mg q.d. will be added to her medical regime with appropriate followup of electrolytes and renal function.  Patient will collect values of blood pressure at home and return in 3 weeks to see the cardiology nurses for reassessment.  Problem # 3:  OSTEOPOROSIS (ICD-733.00) Patient is  taking TUMS for treatment of osteoporosis.  She has not had a bone density determination in some time.  Her primary care physician may wish to repeat that study depending upon previous results.  I will plan to see this nice woman again in one year.  Other Orders: T-CBC w/Diff ([id number]) T-Comprehensive Metabolic Panel [id number]) Hemoccult Cards (Take Home) (Hemoccult Cards) Future Orders: T-Basic Metabolic Panel [phone]) ... 09/13/2009 T-Basic Metabolic Panel [id number]) ... 07/13/2009  Patient Instructions: 1)  Your physician recommends that you schedule a follow-up appointment in: 1 year 2)  Your physician recommends that you return for lab work in: 3 weeks and 3 months 3)  Your physician has recommended you make the following change in your medication: lisinopril 20mg  daily 4)  You have been referred to nurse visit in 3 weeks 5)  Your physician has asked that you test your stool for blood. It is necessary to test 3 different stool specimens for accuracy. You will be given 3 hemoccult cards for specimen collection. For each stool specimen, place a small portion of stool sample (from 2 different areas of the stool) into the 2 squares on the card. Close card. Repeat with 2 more stool specimens. Bring the cards back to the office for testing. 6)  Your physician has requested that you regularly monitor and record your blood pressure readings at home.  Please use the same machine at the same time of day to check your readings and record them to bring to your follow-up visit. check blood pressure daily and record, please bring to nurse visit Prescriptions: LISINOPRIL 20 MG TABS (LISINOPRIL) Take one tablet by mouth daily  #30 x 3   Entered by:   Renee Lower RN   Authorized by:   Renee Brunswick, MD, [facility]   Signed by:   Renee Lower RN on 06/13/2009   Method used:   Electronically to        [facility]* (retail)       [address]       [place], [place]  [address]       Ph: [phone]       Fax: [phone]   RxID:   [id number]

## 2010-02-07 NOTE — Medication Information (Signed)
Summary: ccr-lr  Anticoagulant Therapy  Managed by: Vashti Hey, RN PCP: [facility] Supervising MD: Diona Browner MD, Remi Deter Indication 1: Atrial Fibrillation (ICD-427.31) Lab Used: [facility] [place] Site: [place] INR POC 3.1  Dietary changes: no    Health status changes: no    Bleeding/hemorrhagic complications: no    Recent/future hospitalizations: no    Any changes in medication regimen? no    Recent/future dental: no  Any missed doses?: no       Is patient compliant with meds? yes       Allergies: No Known Drug Allergies  Anticoagulation Management History:      The patient is taking warfarin and comes in today for a routine follow up visit.  Positive risk factors for bleeding include an age of 50 years or older.  The bleeding index is 'intermediate risk'.  Positive CHADS2 values include History of HTN and Age > 53 years old.  The start date was 05/20/1998.  Her last INR was 2.5.  Anticoagulation responsible provider: Diona Browner MD, Remi Deter.  INR POC: 3.1.  Cuvette Lot#: [id number].  Exp: 10/11.    Anticoagulation Management Assessment/Plan:      The patient's current anticoagulation dose is Coumadin 5 mg tabs: take 1 tablet by mouth once daily as directed by Coumadin clinic.  The target INR is 2 - 3.  The next INR is due 09/08/2009.  Anticoagulation instructions were given to patient.  Results were reviewed/authorized by Vashti Hey, RN.  She was notified by Vashti Hey RN.         Prior Anticoagulation Instructions: INR 2.3 Continue coumadin 5mg  once daily except 7.5mg  on Tuesdays and Saturdays  Current Anticoagulation Instructions: INR 3.1 Take coumadin 1/2 tablet tonight then resume 1 tablet once daily except 1 1/2 tablets on Tuesdays and Saturdays Increase greens

## 2010-02-07 NOTE — Miscellaneous (Signed)
Summary: chest xray 01/22/2008  Clinical Lists Changes  Observations: Added new observation of CXR RESULTS:   Clinical Data: Anterior chest pain    CHEST - 2 VIEW    Comparison: 01/21/2008    Findings: Cardiomegaly.  COPD.  No pulmonary vascular congestion or   active lung process.  Masses scoliosis convex to the right.    IMPRESSION:   No significant change.  No acute chest findings.  Cardiomegaly.   COPD.    Read By:  Jonne Ply,  M.D.   Released By:  Jonne Ply,  M.D. (01/22/2008 11:30)      CXR  Procedure date:  01/22/2008  Findings:        Clinical Data: Anterior chest pain    CHEST - 2 VIEW    Comparison: 01/21/2008    Findings: Cardiomegaly.  COPD.  No pulmonary vascular congestion or   active lung process.  Masses scoliosis convex to the right.    IMPRESSION:   No significant change.  No acute chest findings.  Cardiomegaly.   COPD.    Read By:  Jonne Ply,  M.D.   Released By:  Jonne Ply,  Judie Petit.D.

## 2010-02-07 NOTE — Progress Notes (Signed)
Summary: HAS QUESTION ABOUT METOPROLOL   Phone Note Call from Patient Call back at Home Phone [phone]   Caller: PT Reason for Call: Talk to Nurse Summary of Call: PT WAS PUT ON METOPROLOL AT LAST VISIT AND IT IS MAKING HER STOOL DARK, SHE JUST WANTS TO KNOW IF THIS IS NORMAL. Initial call taken by: Faythe Ghee,  June 27, 2009 11:31 AM  Follow-up for Phone Call        Pt has been eating black cherries and I asked her to call Thursday after not eating cherries 3 days.   Follow-up by: Teressa Lower RN,  June 27, 2009 11:50 AM

## 2010-02-07 NOTE — Medication Information (Signed)
Summary: ccr-4 wk-lr  Anticoagulant Therapy  Managed by: Vashti Hey, RN Supervising MD: Diona Browner MD, Remi Deter Indication 1: Atrial Fibrillation (ICD-427.31) Lab Used: [facility] [place] Site: [place] INR POC 2.0  Dietary changes: no    Health status changes: no    Bleeding/hemorrhagic complications: no    Recent/future hospitalizations: no    Any changes in medication regimen? no    Recent/future dental: no  Any missed doses?: no       Is patient compliant with meds? yes       Anticoagulation Management History:      The patient is taking warfarin and comes in today for a routine follow up visit.  Positive risk factors for bleeding include an age of 75 years or older.  The bleeding index is 'intermediate risk'.  Positive CHADS2 values include History of HTN and Age > 68 years old.  The start date was 05/20/1998.  Anticoagulation responsible provider: Diona Browner MD, Remi Deter.  INR POC: 2.0.  Cuvette Lot#: [id number].  Exp: 10/11.    Anticoagulation Management Assessment/Plan:      The patient's current anticoagulation dose is Coumadin 5 mg tabs: take 1 tablet by mouth once daily as directed by Coumadin clinic.  The target INR is 2 - 3.  The next INR is due 02/10/2009.  Anticoagulation instructions were given to patient.  Results were reviewed/authorized by Vashti Hey, RN.  She was notified by Vashti Hey RN.         Prior Anticoagulation Instructions: INR 2.7 Continue coumadin 5mg  once daily except 7.5mg  on Tuesdays and Saturdays  Current Anticoagulation Instructions: INR 2.0 Continue coumadin 5mg  once daily except 7.5mg  on Tuesdays and Saturdays

## 2010-02-07 NOTE — Letter (Signed)
Summary: [place] Results Engineer, agricultural at [facility]  [address], [place] [address]   Phone: [phone]  Fax: [phone]      June 14, 2009 MRN: [id number]   [facility] [address] [place], [place]  [address]   Dear Ms. Piggott,  Your test ordered by Selena Batten has been reviewed by your physician (or physician assistant) and was found to be normal or stable. Your physician (or physician assistant) felt no changes were needed at this time.  ____ Echocardiogram  ____ Cardiac Stress Test  __x__ Lab Work  ____ Peripheral vascular study of arms, legs or neck  ____ CT scan or X-ray  ____ Lung or Breathing test  ____ Other:  Please limit the amount of potassium in your diet.  Enclosed is a copy of your labwork for your   records and a list of foods high in potassium.  Please try to avoid these and other foods rich in   potassium.   Thank you, Joscelynn Brutus [name] RN    Sharon [place] Bing, MD, Lenise Arena.C.Gaylord Shih, MD, F.A.C.C Lewayne Bunting, MD, F.A.C.C Nona Dell, MD, F.A.C.C Charlton Haws, MD, Lenise Arena.C.C

## 2010-02-07 NOTE — Letter (Signed)
Summary: [place] Results Engineer, agricultural at [facility]  [address], [place] [address]   Phone: [phone]  Fax: [phone]      September 19, 2009 MRN: [id number]   [facility] [address] [place], [place]  [address]   Dear [name],  Your test ordered by Renee Cameron has been reviewed by your physician (or physician assistant) and was found to be normal or stable. Your physician (or physician assistant) felt no changes were needed at this time.  ____ Echocardiogram  ____ Cardiac Stress Test  __X__ Lab Work  ____ Peripheral vascular study of arms, legs or neck  ____ CT scan or X-ray  ____ Lung or Breathing test  ____ Other:  No change in medical treatment at this time, per Dr. Dietrich Pates.  Enclosed is a copy of your labwork for your records.  Thank you, Renee [name] RN    [place] Bing, MD, Lenise Arena.C.Gaylord Shih, MD, F.A.C.C Lewayne Bunting, MD, F.A.C.C Nona Dell, MD, F.A.C.C Charlton Haws, MD, Lenise Arena.C.C

## 2010-02-07 NOTE — Medication Information (Signed)
Summary: ccr-lr  Anticoagulant Therapy  Managed by: Vashti Hey, RN PCP: [facility] Supervising MD: Dietrich Pates MD, Molly Maduro Indication 1: Atrial Fibrillation (ICD-427.31) Lab Used: [facility] [place] Site: [place] INR POC 1.9  Dietary changes: no    Health status changes: no    Bleeding/hemorrhagic complications: no    Recent/future hospitalizations: no    Any changes in medication regimen? no    Recent/future dental: no  Any missed doses?: no       Is patient compliant with meds? yes       Allergies: No Known Drug Allergies  Anticoagulation Management History:      The patient is taking warfarin and comes in today for a routine follow up visit.  Positive risk factors for bleeding include an age of 75 years or older.  The bleeding index is 'intermediate risk'.  Positive CHADS2 values include History of HTN and Age > 20 years old.  The start date was 05/20/1998.  Her last INR was 2.5.  Anticoagulation responsible Jaxtyn [place]: Dietrich Pates MD, Molly Maduro.  INR POC: 1.9.  Cuvette Lot#: [id number].  Exp: 10/11.    Anticoagulation Management Assessment/Plan:      The patient's current anticoagulation dose is Coumadin 5 mg tabs: take 1 tablet by mouth once daily as directed by Coumadin clinic.  The target INR is 2 - 3.  The next INR is due 12/08/2009.  Anticoagulation instructions were given to patient.  Results were reviewed/authorized by Vashti Hey, RN.  She was notified by Vashti Hey RN.         Prior Anticoagulation Instructions: INR 2.8 Continue coumadin 5mg  once daily except 7.5mg  on Tuesdays and Saturdays  Current Anticoagulation Instructions: INR 1.9 Take coumadin 1 1/2 tablets tonight then resume 1 tablet once daily except 1 1/2 tablets on Tuesdays and Saturdays

## 2010-02-07 NOTE — Medication Information (Signed)
Summary: ccr-lr  Anticoagulant Therapy  Managed by: Vashti Hey, RN Supervising MD: Dietrich Pates MD, Molly Maduro Indication 1: Atrial Fibrillation (ICD-427.31) Lab Used: [facility] [place] Site:  INR POC 2.4  Dietary changes: no    Health status changes: no    Bleeding/hemorrhagic complications: no    Recent/future hospitalizations: no    Any changes in medication regimen? no    Recent/future dental: no  Any missed doses?: no       Is patient compliant with meds? yes       Anticoagulation Management History:      The patient is taking warfarin and comes in today for a routine follow up visit.  Positive risk factors for bleeding include an age of 75 years or older.  The bleeding index is 'intermediate risk'.  Positive CHADS2 values include History of HTN and Age > 43 years old.  The start date was 05/20/1998.  Anticoagulation responsible provider: Dietrich Pates MD, Molly Maduro.  INR POC: 2.4.  Cuvette Lot#: [id number].  Exp: 10/11.    Anticoagulation Management Assessment/Plan:      The patient's current anticoagulation dose is Coumadin 5 mg tabs: take 1 tablet by mouth once daily as directed by Coumadin clinic.  The target INR is 2 - 3.  The next INR is due 05/09/2009.  Anticoagulation instructions were given to patient.  Results were reviewed/authorized by Vashti Hey, RN.  She was notified by Vashti Hey RN.         Prior Anticoagulation Instructions: INR 2.2 Continue coumadin 5mg  once daily except 7.5mg  on Tuesdays and Saturdays  Current Anticoagulation Instructions: INR 2.4 Continue coumadin 5mg  once daily except 7.5mg  on Tuesdays and Saturdays

## 2010-02-07 NOTE — Letter (Signed)
Summary: [place] Results Engineer, agricultural at [facility]  [address], [place] [address]   Phone: [phone]  Fax: [phone]      June 20, 2009 MRN: [id number]   [facility] [address] [place], [place]  [address]   Dear Renee Cameron,  Your test ordered by Renee Cameron has been reviewed by your physician (or physician assistant) and was found to be normal or stable. Your physician (or physician assistant) felt no changes were needed at this time.  ____ Echocardiogram  ____ Cardiac Stress Test  ____ Lab Work  ____ Peripheral vascular study of arms, legs or neck  ____ CT scan or X-ray  ____ Lung or Breathing test  _x___ Other: stool cards  No change in medical treatment at this time, per Dr. Dietrich Pates.  Thank you, Harly Pipkins [name] RN    [place] Bing, MD, Lenise Arena.C.Gaylord Shih, MD, F.A.C.C Lewayne Bunting, MD, F.A.C.C Nona Dell, MD, F.A.C.C Charlton Haws, MD, Lenise Arena.C.C

## 2010-02-07 NOTE — Progress Notes (Signed)
Summary: refills   Phone Note Outgoing Call   Call placed by: Youlanda Mighty RN,  April 25, 2009 2:50 PM Call placed to: Patient Summary of Call: Received fax from pharmacy for prescription refills for metoprolol 25mg  and coumadin 5mg , pt needs to make appointment to see Dr. Dietrich Pates to refill prescriptions. Message left to return call to office to make appointment.

## 2010-02-07 NOTE — Letter (Signed)
Summary: BP READINGS  BP READINGS   Imported By: Faythe Ghee 07/13/2009 15:57:12  _____________________________________________________________________  External Attachment:    Type:   Image     Comment:   External Document

## 2010-02-07 NOTE — Miscellaneous (Signed)
Summary: metorprolol refill  Clinical Lists Changes  Medications: Rx of COUMADIN 5 MG TABS (WARFARIN SODIUM) take 1 tablet by mouth once daily as directed by Coumadin clinic;  #60 x 3;  Signed;  Entered by: Teressa Lower RN;  Authorized by: Kathlen Brunswick, MD, [facility];  Method used: Electronically to [facility]*, [address] [place], [place], [place]  [address], Ph: [phone], Fax: [phone]    Prescriptions: COUMADIN 5 MG TABS (WARFARIN SODIUM) take 1 tablet by mouth once daily as directed by Coumadin clinic  #60 x 3   Entered by:   Teressa Lower RN   Authorized by:   Kathlen Brunswick, MD, [facility]   Signed by:   Teressa Lower RN on 05/24/2009   Method used:   Electronically to        [facility] (retail)       [address]       [place], [place]  [address]       Ph: [phone]       Fax: [phone]   RxID:   [id number]   Appended Document: warfarin refill    Clinical Lists Changes  Medications: Added new medication of METOPROLOL SUCCINATE 25 MG XR24H-TAB (METOPROLOL SUCCINATE) Take one tablet by mouth daily - Signed Rx of METOPROLOL SUCCINATE 25 MG XR24H-TAB (METOPROLOL SUCCINATE) Take one tablet by mouth daily;  #30 x 3;  Signed;  Entered by: Teressa Lower RN;  Authorized by: Kathlen Brunswick, MD, [facility];  Method used: Electronically to [facility] [address], [place], [place], [place]  [address], Ph: [phone], Fax: [phone]    Prescriptions: METOPROLOL SUCCINATE 25 MG XR24H-TAB (METOPROLOL SUCCINATE) Take one tablet by mouth daily  #30 x 3   Entered by:   Teressa Lower RN   Authorized by:   Kathlen Brunswick, MD, [facility]   Signed by:   Teressa Lower RN on 05/24/2009   Method used:   Electronically to        [facility] (retail)       [address]       [place], [place]  [address]       Ph: [phone]       Fax: [phone]   RxID:    [id number]

## 2010-02-07 NOTE — Medication Information (Signed)
Summary: ccr-lr  Anticoagulant Therapy  Managed by: Vashti Hey, RN Supervising MD: Dietrich Pates MD, Molly Maduro Indication 1: Atrial Fibrillation (ICD-427.31) Lab Used: [facility] [place] Site: [place] INR POC 2.2  Dietary changes: no    Health status changes: no    Bleeding/hemorrhagic complications: no    Recent/future hospitalizations: no    Any changes in medication regimen? no    Recent/future dental: no  Any missed doses?: no       Is patient compliant with meds? yes       Anticoagulation Management History:      The patient is taking warfarin and comes in today for a routine follow up visit.  Positive risk factors for bleeding include an age of 75 years or older.  The bleeding index is 'intermediate risk'.  Positive CHADS2 values include History of HTN and Age > 40 years old.  The start date was 05/20/1998.  Anticoagulation responsible provider: Dietrich Pates MD, Molly Maduro.  INR POC: 2.2.  Cuvette Lot#: [id number].  Exp: 10/11.    Anticoagulation Management Assessment/Plan:      The patient's current anticoagulation dose is Coumadin 5 mg tabs: take 1 tablet by mouth once daily as directed by Coumadin clinic.  The target INR is 2 - 3.  The next INR is due 06/13/2009.  Anticoagulation instructions were given to patient.  Results were reviewed/authorized by Vashti Hey, RN.  She was notified by Vashti Hey RN.         Prior Anticoagulation Instructions: INR 2.4 Continue coumadin 5mg  once daily except 7.5mg  on Tuesdays and Saturdays  Current Anticoagulation Instructions: INR 2.2 Continue coumadin 5mg  once daily except 7.5mg  on Tuesdays and Saturdays

## 2010-02-09 NOTE — Medication Information (Signed)
Summary: ccr-lr  Anticoagulant Therapy  Managed by: Vashti Hey, RN PCP: [facility] Supervising MD: Dietrich Pates MD, Molly Maduro Indication 1: Atrial Fibrillation (ICD-427.31) Lab Used: [facility] [place] [place] [employer] Site: [place] INR POC 2.2  Dietary changes: no    Health status changes: no    Bleeding/hemorrhagic complications: no    Recent/future hospitalizations: no    Any changes in medication regimen? no    Recent/future dental: no  Any missed doses?: no       Is patient compliant with meds? yes       Allergies: No Known Drug Allergies  Anticoagulation Management History:      The patient is taking warfarin and comes in today for a routine follow up visit.  Positive risk factors for bleeding include an age of 75 years or older.  The bleeding index is 'intermediate risk'.  Positive CHADS2 values include History of HTN and Age > 33 years old.  The start date was 05/20/1998.  Her last INR was 2.5.  Anticoagulation responsible provider: Dietrich Pates MD, Molly Maduro.  INR POC: 2.2.  Cuvette Lot#: [id number].  Exp: 10/11.    Anticoagulation Management Assessment/Plan:      The patient's current anticoagulation dose is Coumadin 5 mg tabs: take 1 tablet by mouth once daily as directed by Coumadin clinic.  The target INR is 2 - 3.  The next INR is due 02/09/2010.  Anticoagulation instructions were given to patient.  Results were reviewed/authorized by Vashti Hey, RN.  She was notified by Vashti Hey RN.         Prior Anticoagulation Instructions: INR 1.9 Increase coumadin to 5mg  once daily except 7.5mg  on Mondays, Wednesdays and Fridays  Current Anticoagulation Instructions: INR 2.2 Continue coumadin 5mg  once daily except 7.5mg  on Mondays, Wednesdays and Fridays

## 2010-02-13 ENCOUNTER — Encounter (INDEPENDENT_AMBULATORY_CARE_PROVIDER_SITE_OTHER): Payer: Medicare Other

## 2010-02-13 ENCOUNTER — Encounter: Payer: Self-pay | Admitting: Cardiology

## 2010-02-13 DIAGNOSIS — Z7901 Long term (current) use of anticoagulants: Secondary | ICD-10-CM

## 2010-02-13 DIAGNOSIS — I4891 Unspecified atrial fibrillation: Secondary | ICD-10-CM

## 2010-02-23 NOTE — Medication Information (Signed)
Summary: protime/tg  Anticoagulant Therapy Managed by: Vashti Hey, RN Patient Assessment Part 2:  Have you MISSED ANY DOSES or CHANGED TABLETS?  0  Have you had any BRUISING or BLEEDING ( nose or gum bleeds,blood in urine or stool)?  Have you STARTED or STOPPED any MEDICATIONS, including OTC meds,herbals or supplements?  Have you CHANGED your DIET, especially green vegetables,or ALCOHOL intake?  Have you had any ILLNESSES or HOSPITALIZATIONS?  Have you had any signs of CLOTTING?(chest discomfort,dizziness,shortness of breath,arms tingling,slurred speech,swelling or redness in leg)       Regimen Out:    Total Weekly: 42.50 mg mg  Next INR Due: 03/15/2010      Allergies: No Known Drug Allergies  Anticoagulant Therapy  Managed by: Vashti Hey, RN PCP: [facility] Supervising MD: Dietrich Pates MD, Molly Maduro Indication 1: Atrial Fibrillation (ICD-427.31) Lab Used: [facility]  Site: [place] INR POC 2.3  Dietary changes: no    Health status changes: no    Bleeding/hemorrhagic complications: no    Recent/future hospitalizations: no    Any changes in medication regimen? no    Recent/future dental: no  Any missed doses?: no       Is patient compliant with meds? yes         Anticoagulation Management History:      The patient is taking warfarin and comes in today for a routine follow up visit.  Positive risk factors for bleeding include an age of 75 years or older.  The bleeding index is 'intermediate risk'.  Positive CHADS2 values include History of HTN and Age > 66 years old.  The start date was 05/20/1998.  Her last INR was 2.5.  Anticoagulation responsible provider: Dietrich Pates MD, Molly Maduro.  INR POC: 2.3.  Cuvette Lot#: [id number].  Exp: 10/11.    Anticoagulation Management Assessment/Plan:      The patient's current anticoagulation dose is Coumadin 5 mg tabs: take 1 tablet by mouth once daily as directed by Coumadin clinic.   The target INR is 2 - 3.  The next INR is due 03/15/2010.  Anticoagulation instructions were given to patient.  Results were reviewed/authorized by Vashti Hey, RN.  She was notified by Vashti Hey RN.         Prior Anticoagulation Instructions: INR 2.2 Continue coumadin 5mg  once daily except 7.5mg  on Mondays, Wednesdays and Fridays  Current Anticoagulation Instructions: INR 2.3 Continue coumadin 5mg  once daily except 7.5mg  on Mondays, Wednesdays and Fridays

## 2010-03-15 ENCOUNTER — Encounter: Payer: Self-pay | Admitting: Cardiovascular Disease

## 2010-03-15 ENCOUNTER — Encounter (INDEPENDENT_AMBULATORY_CARE_PROVIDER_SITE_OTHER): Payer: Medicare Other

## 2010-03-15 DIAGNOSIS — Z7901 Long term (current) use of anticoagulants: Secondary | ICD-10-CM

## 2010-03-15 DIAGNOSIS — I4891 Unspecified atrial fibrillation: Secondary | ICD-10-CM

## 2010-03-21 NOTE — Medication Information (Signed)
Summary: ccr-lr  Anticoagulant Therapy  Managed by: Vashti Hey, RN PCP: [facility] Supervising MD: Eden Emms MD, Theron Arista Indication 1: Atrial Fibrillation (ICD-427.31) Lab Used: [facility] [place] Site: [place] INR POC 2.6  Dietary changes: no    Health status changes: no    Bleeding/hemorrhagic complications: no    Recent/future hospitalizations: no    Any changes in medication regimen? no    Recent/future dental: no  Any missed doses?: no       Is patient compliant with meds? yes       Allergies: No Known Drug Allergies  Anticoagulation Management History:      The patient is taking warfarin and comes in today for a routine follow up visit.  Positive risk factors for bleeding include an age of 75 years or older.  The bleeding index is 'intermediate risk'.  Positive CHADS2 values include History of HTN and Age > 60 years old.  The start date was 05/20/1998.  Her last INR was 2.5.  Anticoagulation responsible provider: Eden Emms MD, Theron Arista.  INR POC: 2.6.  Cuvette Lot#: [id number].  Exp: 10/11.    Anticoagulation Management Assessment/Plan:      The patient's current anticoagulation dose is Coumadin 5 mg tabs: take 1 tablet by mouth once daily as directed by Coumadin clinic.  The target INR is 2 - 3.  The next INR is due 04/12/2010.  Anticoagulation instructions were given to patient.  Results were reviewed/authorized by Vashti Hey, RN.  She was notified by Vashti Hey RN.         Prior Anticoagulation Instructions: INR 2.3 Continue coumadin 5mg  once daily except 7.5mg  on Mondays, Wednesdays and Fridays  Current Anticoagulation Instructions: INR 2.6 Continue coumadin 5mg  once daily except 7.5mg  on Mondays, Wednesdays and Fridays

## 2010-04-11 ENCOUNTER — Encounter: Payer: Self-pay | Admitting: Cardiology

## 2010-04-11 DIAGNOSIS — Z7901 Long term (current) use of anticoagulants: Secondary | ICD-10-CM

## 2010-04-11 DIAGNOSIS — I4891 Unspecified atrial fibrillation: Secondary | ICD-10-CM

## 2010-04-12 ENCOUNTER — Encounter: Payer: Self-pay | Admitting: *Deleted

## 2010-04-12 ENCOUNTER — Encounter: Payer: Medicare Other | Admitting: *Deleted

## 2010-04-17 ENCOUNTER — Ambulatory Visit (INDEPENDENT_AMBULATORY_CARE_PROVIDER_SITE_OTHER): Payer: Medicare Other | Admitting: *Deleted

## 2010-04-17 DIAGNOSIS — Z7901 Long term (current) use of anticoagulants: Secondary | ICD-10-CM

## 2010-04-17 DIAGNOSIS — I4891 Unspecified atrial fibrillation: Secondary | ICD-10-CM

## 2010-04-17 LAB — POCT INR: INR: 2.2

## 2010-04-22 ENCOUNTER — Other Ambulatory Visit: Payer: Self-pay | Admitting: Cardiology

## 2010-04-24 LAB — HEPATIC FUNCTION PANEL
ALT: 37 U/L — ABNORMAL HIGH (ref 0–35)
Albumin: 3.7 g/dL (ref 3.5–5.2)
Alkaline Phosphatase: 52 U/L (ref 39–117)
Total Protein: 6.1 g/dL (ref 6.0–8.3)

## 2010-04-24 LAB — BASIC METABOLIC PANEL
BUN: 10 mg/dL (ref 6–23)
Calcium: 9.4 mg/dL (ref 8.4–10.5)
Chloride: 103 mEq/L (ref 96–112)
Creatinine, Ser: 0.79 mg/dL (ref 0.4–1.2)
GFR calc Af Amer: >60 mL/min (ref >60)
GFR calc non Af Amer: >60 mL/min (ref >60)

## 2010-04-24 LAB — CBC
MCV: 90.7 fL (ref 78.0–100.0)
Platelets: 185 10*3/uL (ref 150–400)
RBC: 4.36 MIL/uL (ref 3.87–5.11)
WBC: 5.4 10*3/uL (ref 4.0–10.5)

## 2010-04-24 LAB — POCT CARDIAC MARKERS
CKMB, poc: 1 ng/mL (ref 1.0–8.0)
CKMB, poc: 1.4 ng/mL (ref 1.0–8.0)
Troponin i, poc: <0.05 ng/mL (ref 0.00–0.09)

## 2010-04-24 LAB — LIPASE, BLOOD: Lipase: 41 U/L (ref 11–59)

## 2010-04-24 LAB — DIFFERENTIAL
Eosinophils Absolute: 0.1 10*3/uL (ref 0.0–0.7)
Lymphs Abs: 1.9 10*3/uL (ref 0.7–4.0)
Neutro Abs: 3 10*3/uL (ref 1.7–7.7)
Neutrophils Relative %: 56 % (ref 43–77)

## 2010-04-24 LAB — PROTIME-INR: INR: 2.5 — ABNORMAL HIGH (ref 0.00–1.49)

## 2010-05-15 ENCOUNTER — Ambulatory Visit (INDEPENDENT_AMBULATORY_CARE_PROVIDER_SITE_OTHER): Payer: Medicare Other | Admitting: *Deleted

## 2010-05-15 DIAGNOSIS — Z7901 Long term (current) use of anticoagulants: Secondary | ICD-10-CM

## 2010-05-15 DIAGNOSIS — I4891 Unspecified atrial fibrillation: Secondary | ICD-10-CM

## 2010-05-23 NOTE — Consult Note (Signed)
NAME:  Renee Cameron, Renee Cameron              ACCOUNT NO.:  [id number]   MEDICAL RECORD NO.:  [id number]          PATIENT TYPE:  AMB   LOCATION:  DAY                           FACILITY:  [facility]   PHYSICIAN:  R. Roetta Sessions, M.D. DATE OF BIRTH:  24-May-1924   DATE OF CONSULTATION:  02/16/2008  DATE OF DISCHARGE:                                 CONSULTATION   REFERRING PHYSICIAN:  Patrica Duel, M.D.   REASON FOR CONSULTATION:  Esophageal dysphagia to pills.   HISTORY OF PRESENT ILLNESS:  [name] is a very pleasant 33-  year-old lady with chronic atrial fibrillation on Coumadin who comes on  referral by Dr. Nobie Putnam with a 3 to 83-month history of intermittent  difficulty swallowing pills.  She switched to Centrum Silver and fish  oil capsules and has a difficult time swallowing them.  She has no  difficulties with other pills and no difficulty whatsoever with meat or  bread.  She has not had any early satiety, nausea or vomiting.  No  melena or hematochezia or other change in bowel habits.  She has not had  any associated abdominal pain.  She did have some chest discomfort for  which she visited the ED on January 21, 2008.  Cardiac workup was  negative.  Abdominal ultrasound demonstrated no evidence of occult  hepatobiliary disease.  She has not had any chest pain since that  episode, really does not describe any typical reflux symptoms, no  odynophagia.  She had been doing will on Fosamax but a couple of weeks  ago she reports Dr. Nobie Putnam simply stopped that agent.   PAST MEDICAL HISTORY:  1. Significant for hypertension.  2. Atrial fibrillation.  3. Osteoporosis.   PAST SURGERIES:  1. Appendectomy.  2. Fallopian tube surgery.  3. Ovarian surgery.  4. Negative colonoscopy by Dr. Franky Macho back in 2003 and again in      2005 by Dr. Karilyn Cota,  at that time revealed some small diverticula.   MEDICATIONS:  1. Fish oil tablet daily.  2. __________ 50 mg daily.  3. Coumadin  scheduled.  4. Multivitamin daily.  5. Tums 6 tablets daily.  6. Vitamin C daily.  7. Vitamin E daily.  8. Alprazolam 0.5 mg p.r.n.   ALLERGIES:  No known drug allergies.   FAMILY HISTORY:  No history of chronic GI or liver illness.  Father died  in his 58s with a CVA and prostate cancer.  Mother died in her 58s with  heart problems.   SOCIAL HISTORY:  The patient is widowed and has 1 child adopted.  She  retired from [employer].  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  No tobacco, no alcohol, no illicit drugs.   REVIEW OF SYSTEMS:  No recent chest pain, no dyspnea on exertion, no  fevers or chills.  Otherwise, as in history of present illness.   PHYSICAL EXAMINATION:  A pleasant 77 year old lady resting comfortably.  Weight 134, height 5 feet 4, temp 97.7, BP 128/88, pulse is 64.  SKIN:  Warm and dry.  HEENT EXAM:  No scleral icterus, conjunctivae are pink.  No cervical  adenopathy.  CHEST:  Lungs are clear to auscultation.  CARDIAC EXAM:  Irregularly irregular rhythm without murmur, gallop or  rub.  BREAST EXAM:  Deferred.  ABDOMEN:  Flat.  Positive bowel sounds, soft, nontender without  appreciable mass or organomegaly.  EXTREMITIES:  Have no edema.   IMPRESSION:  [name] is a very pleasant 77 year old lady with several-  month history of vague intermittent esophageal dysphagia to solids.  It  almost sounds like she is having some transfer dysphagia, particularly  with Centrum and her fish oil capsules.  She does well with her other  medications and has no dysphagia symptoms whatsoever with her other  medications.  She seemed to do well on Fosamax.  That agent has been  stopped.  She had fleeting chest pain of uncertain etiology.  I do not  get a history she has got any significant degree of any chronic reflux  symptoms and no odynophagia.   RECOMMENDATIONS:  I told [name] we ought to go ahead and do a barium  pill esophagram.  This  would give [place] a good amount of information and  will hold off on the more invasive approach of an EGD at this time.  If  she happens to have a structural lesion implied on the barium pill  esophagram, then we would regroup and we would arrange for her Coumadin  to be held so that we could perform an EGD with therapeutic intervention  as needed.  We had a lengthy discussion about this approach and  questions were answered.  She is agreeable.  We will go ahead and get a  barium pill esophagram and then we will render further recommendations  in the very near future.   I would like to thank Dr. Patrica Duel for allowing me to see this nice  lady today.      Renee Cameron, M.D.  Electronically Signed     RMR/MEDQ  D:  02/17/2008  T:  02/17/2008  Job:  [id number]   cc:   Patrica Duel, M.D.  Fax: [phone]

## 2010-05-23 NOTE — Letter (Signed)
May 07, 2007    Patrica Duel, M.D.  [address]  [place], [place] [address]   RE:  Renee Cameron, Renee Cameron  MRN:  [id number]  /  DOB:  [birth year]   Dear Loraine Leriche:   Renee Cameron returns to the office for continued assessment and treatment  of chronic atrial fibrillation requiring anticoagulation.  Since I last  saw her a year ago, she has continued to extremely well.  She does all  her housework and mows her lawn with a tractor at age 76.  She reports  an occasional need for a sighing respiration, but no real dyspnea.  She  notes no palpitations.  She has had no lightheadedness or syncope.  She  has not required any urgent medical attention.   Medications are unchanged from last year.   On exam, trim, pleasant, sharp, older woman.  The weight is 129, 3  pounds more than last year.  Blood pressure 135/80, heart rate 78 and  irregular, respirations 17.  NECK:  No jugular venous distention; no carotid bruits.  LUNGS:  Minimal right basilar rales.  CARDIAC:  Irregular rhythm; normal first and second heart sounds;  minimal systolic murmur.  ABDOMEN:  Soft and nontender; no organomegaly.  EXTREMITIES:  No edema; normal distal pulses.   LABORATORY STUDIES:  Ordered at her last visit were apparently never  collected.  We have no record of any testing since 2007.   IMPRESSION:  Renee Cameron is doing well overall.  We will obtain a  chemistry profile, CBC, lipid profile, and stool for hemoccult testing.  If reason are good, I will plan to see this nice woman again in one year  and to follow warfarin treatment in anticoagulation clinic.  Vaccinations are up-to-date.    Sincerely,      Gerrit Friends. Dietrich Pates, MD, [facility]  Electronically Signed    RMR/MedQ  DD: 05/07/2007  DT: 05/07/2007  Job #: [id number]

## 2010-05-23 NOTE — Assessment & Plan Note (Signed)
[facility]                       [place] CARDIOLOG[facility] OFFICE NOTE   NAME:HOBBSSalene, Mohamud                     MRN:          [id number]  DATE:02/03/2008                            DOB:          [birth year]    CARDIOLOGIST:  Gerrit Friends. Dietrich Pates, MD, [facility]   PRIMARY CARE PHYSICIAN:  Patrica Duel, MD   REASON FOR VISIT:  Chest pain.   HISTORY OF PRESENT ILLNESS:  Ms. Kildow is an 77 year old female patient  followed by Dr. Dietrich Pates with a history of permanent atrial fibrillation  on chronic Coumadin therapy who recently presented to [facility] Emergency Room with complaints of chest discomfort.  She  described it as a sharp substernal pain radiating to the right chest and  to her back in the interscapular region.  She had eaten about 3 hours  before this.  She had no other associated symptoms.  Specifically, she  denied any arm or jaw discomfort, associated shortness of breath,  nausea, or diaphoresis.  She denied any syncope or near-syncope.  She  tried to take some sodium bicarbonate to help her belch.  This did not  help and she eventually went to the emergency room.  In the ER, her D-  dimer was less than 0.22.  Her INR was therapeutic at 2.5, her  hemoglobin was normal at 13.2, potassium was low at 3.10, creatinine  0.79.  She had two sets of point-of-care markers which were negative.  Chest x-ray was notable for cardiomegaly and COPD, left basilar scar, or  atelectasis.  Her symptoms subsided and she saw her primary care  physician in followup.  He set her up for an ultrasound of her abdomen  to rule out gallbladder disease.  This was negative for gallstones or  gallbladder disease.  Dr. Nobie Putnam referred her back to [place] today for  further evaluation of her chest discomfort.   The patient is very active for an 77 year old.  She denies any chest  heaviness or tightness with exertion.  She really describes just minimal  dyspnea with  exertion.  Upon further review, she really describes NYHA  class I symptoms.  She walked up to the third floor of the hospital  today to visit a friend by taking the steps without stopping.  She  denies orthopnea, PND, or pedal edema.  She denies any syncope or near-  syncope.  She does note quite a bit of belching as well as dyspepsia.  She denies water brash symptoms.  She has had difficulty in swallowing  pills, but no food or liquid dysphagia.   CURRENT MEDICATIONS:  Multivitamin daily, Metoprolol ER 50 mg daily,  Coumadin as directed, Vitamin C, Alendronate 70 mg a week, Fish oil  daily, Tums 4 times a day, Tylenol p.r.n.,  Alprazolam 0.5 mg p.r.n.   SOCIAL HISTORY:  She denies tobacco abuse.   FAMILY HISTORY:  She thinks her mother may have died from a heart attack  in her 76s, but she is unsure.   PHYSICAL EXAMINATION:  GENERAL:  She is a well-nourished well-developed  female in no acute distress.  VITAL SIGNS:  Blood pressure is 126/68, pulse 81, weight 131 pounds.  HEENT:  Normal.  Neck:  Without JVD.  CARDIAC:  Normal S1, S2.  Irregularly irregular rhythm.  LUNGS:  Clear to auscultation bilaterally.  No wheezing, no rhonchi, no  rales.  ABDOMEN:  Soft, nontender.  EXTREMITIES:  Without edema.  NEUROLOGIC:  She is alert and oriented x3.  Cranial nerves II through  XII are grossly intact.  VASCULAR:  Without carotid bruits bilaterally.   Electrocardiogram reveals atrial fibrillation with a heart rate of 81,  right bundle-branch block pattern with widening of the QRS in II, III,  and aVF.  No ischemic changes.   ASSESSMENT AND PLAN:  1. Chest pain in an 77 year old female with a history of hypertension      and possible family history of coronary disease.  She recently had      a presentation to the emergency room with chest discomfort about 3      hours after eating.  She has a lot of symptoms of dyspepsia as well      as belching.  She has not had any further  symptoms since then and      is quite active without any complaints of exertional chest      heaviness or tightness or shortness of breath.  She has never been      a smoker.  She has a pending appointment with a gastroenterologist      for further evaluation.  Diagnostic possibilities include      esophageal spasm versus coronary artery disease versus another      etiology.  She had a negative D-dimer during her recent visit to      the emergency room.  She is not really complaining of any symptoms      consistent with exertional angina.  Esophageal spasm seems to be      the most likely diagnosis at this time.  We plan to proceed with a      2D echocardiogram to reassess her left ventricular structure and      function as it has been 17 years since she has had this assessed.      If there are any abnormalities on echocardiography, she may require      further testing.  I discussed her case further with Dr. Dietrich Pates.      At this point in time, we will await further evaluation by the      gastroenterologist.  She will be seen back in routine followup in      April as previously scheduled.  If she has any recurrent symptoms      or change in her symptoms, then we will see her back sooner.  We      will also make sure she has nitroglycerin to take on an as-needed      basis.  2. Permanent atrial fibrillation on chronic Coumadin therapy.  Her      rate seems to be well controlled.  No medication changes were made      today.  3. Hypertension.  This is overall well controlled.   DISPOSITION:  The patient will follow up with Dr. Dietrich Pates in April 2009  as previously scheduled or sooner p.r.n.      Tereso Newcomer, PA-C  Electronically Signed      Gerrit Friends. Dietrich Pates, MD, [facility]  Electronically Signed   SW/MedQ  DD: 02/03/2008  DT: 02/04/2008  Job #: [id number]   cc:  Bonne Bricelyn, M.D.

## 2010-05-24 ENCOUNTER — Other Ambulatory Visit (HOSPITAL_COMMUNITY): Payer: Self-pay | Admitting: Internal Medicine

## 2010-05-24 DIAGNOSIS — Z139 Encounter for screening, unspecified: Secondary | ICD-10-CM

## 2010-05-26 NOTE — Op Note (Signed)
NAME:  Renee Cameron, Renee Cameron                        ACCOUNT NO.:  [id number]   MEDICAL RECORD NO.:  [id number]                   PATIENT TYPE:  AMB   LOCATION:  DAY                                  FACILITY:  [facility]   PHYSICIAN:  Lionel December, M.D.                 DATE OF BIRTH:  June 09, 1924   DATE OF PROCEDURE:  09/23/2003  DATE OF DISCHARGE:                                 OPERATIVE REPORT   PROCEDURE:  Total colonoscopy.   INDICATIONS FOR PROCEDURE:  Renee Cameron is a 77 year old Caucasian female who  is here for a screening colonoscopy.  Family history is negative for  colorectal carcinoma.  The procedure risks were reviewed with the patient,  and informed consent was obtained.   PREOPERATIVE MEDICATIONS:  Demerol 25 mg IV, Versed 3 mg IV in divided dose.   FINDINGS:  The procedure was performed in the endoscopy suite.  The  patient's vital signs and O2 saturations were monitored during the procedure  and remained stable.  The patient was placed in the left lateral recumbent  position and rectal examination performed.  No abnormality noted on external  or digital exam.  The Olympus videoscope was placed into the rectum and  advanced into the region through a tortuous, freely mobile sigmoid colon  into the descending colon and finally to the cecum.  The cecum was  identified by lipomatous ileocecal valve and appendiceal orifice.  Pictures  were taken for the record.  As the scope was withdrawn, the colonic mucosa  was once again carefully examined.  There were a very few small diverticula  at the sigmoid colon, but no polyps and/or tumor masses.  The rectal mucosa  was normal.  The scope was retroflexed to examine the anorectal junction,  and small hemorrhoids were noted below the dentate line.  The endoscope was  straightened and withdrawn.  The patient tolerated the procedure well.   FINAL DIAGNOSIS:  A few small diverticula at sigmoid colon and external  hemorrhoids.  Otherwise normal  colonoscopy.   RECOMMENDATIONS:  1.  High fiber diet.  2.  She will resume Coumadin at usual dose along with other medications.      ___________________________________________                                            Lionel December, M.D.   NR/MEDQ  D:  09/23/2003  T:  09/23/2003  Job:  [id number]   cc:   Patrica Duel, M.D.  [address]  [place]  [place] [address]  Fax: [phone]

## 2010-05-26 NOTE — Letter (Signed)
March 19, 2006    Patrica Duel, M.D.  [address], [address]  [place], [place] [address]   RE:  Renee Cameron, Renee Cameron  MRN:  [id number]  /  DOB:  [birth year]   Dear Loraine Leriche:   Renee Cameron returns to the office for continued assessment and treatment  of atrial fibrillation. Since I last saw her one year ago, she has done  superbly. She denies all cardiopulmonary symptoms. She has not required  urgent medical care. Her medications are unchanged and include  metoprolol 25 mg daily, Warfarin as directed with stable and therapeutic  anticoagulation and Fosamax.   PHYSICAL EXAMINATION:  GENERAL:  Trim, pleasant woman in no acute  distress.  VITAL SIGNS:  The weight is 126, 4 pounds less than last year. Blood  pressure 130/80, heart rate 76 and irregular taken apically.  Respirations 16.  NECK:  No jugular venous distention; normal carotid upstrokes without  bruits.  LUNGS:  Clear.  CARDIAC:  Normal first heart sounds; increased intensity of the second  heart sound; minimal systolic murmur.  ABDOMEN:  Soft and nontender; no masses; no organomegaly; aortic  pulsation not palpable.  EXTREMITIES:  No edema; distal pulses intact.   IMPRESSION:  Renee Cameron is doing beautifully. Vaccinations are up-to-  date. We will check stool for Hemoccult, a CBC and a lipid profile. If  results are good, I will plan to see this nice woman again in 1 year.  She will be monitored in anticoagulation in the interim.    Sincerely,      Gerrit Friends. Dietrich Pates, MD, [facility]  Electronically Signed    RMR/MedQ  DD: 03/19/2006  DT: 03/20/2006  Job #: [id number]

## 2010-05-30 ENCOUNTER — Ambulatory Visit (HOSPITAL_COMMUNITY)
Admission: RE | Admit: 2010-05-30 | Discharge: 2010-05-30 | Disposition: A | Discharge status: Home / Self Care | Payer: Medicare Other | Source: Ambulatory Visit | Attending: Internal Medicine | Admitting: Internal Medicine

## 2010-05-30 DIAGNOSIS — Z139 Encounter for screening, unspecified: Secondary | ICD-10-CM

## 2010-05-30 DIAGNOSIS — Z1231 Encounter for screening mammogram for malignant neoplasm of breast: Secondary | ICD-10-CM | POA: Insufficient documentation

## 2010-06-01 ENCOUNTER — Other Ambulatory Visit: Payer: Self-pay | Admitting: Cardiology

## 2010-06-12 ENCOUNTER — Ambulatory Visit (INDEPENDENT_AMBULATORY_CARE_PROVIDER_SITE_OTHER): Payer: Medicare Other | Admitting: *Deleted

## 2010-06-12 DIAGNOSIS — Z7901 Long term (current) use of anticoagulants: Secondary | ICD-10-CM

## 2010-06-12 DIAGNOSIS — I4891 Unspecified atrial fibrillation: Secondary | ICD-10-CM

## 2010-06-19 ENCOUNTER — Encounter: Payer: Self-pay | Admitting: Cardiology

## 2010-06-27 ENCOUNTER — Encounter: Payer: Medicare Other | Admitting: *Deleted

## 2010-06-27 ENCOUNTER — Ambulatory Visit (INDEPENDENT_AMBULATORY_CARE_PROVIDER_SITE_OTHER): Payer: Medicare Other | Admitting: Adult Health

## 2010-06-27 ENCOUNTER — Ambulatory Visit: Payer: Medicare Other | Admitting: Cardiology

## 2010-06-27 ENCOUNTER — Encounter: Payer: Self-pay | Admitting: Adult Health

## 2010-06-27 DIAGNOSIS — I4891 Unspecified atrial fibrillation: Secondary | ICD-10-CM

## 2010-06-27 DIAGNOSIS — E7800 Pure hypercholesterolemia, unspecified: Secondary | ICD-10-CM

## 2010-06-27 DIAGNOSIS — I10 Essential (primary) hypertension: Secondary | ICD-10-CM

## 2010-06-27 NOTE — Patient Instructions (Signed)
Your physician recommends that you schedule a follow-up appointment in: 1 year with Dr Dietrich Pates  Your physician recommends that you return for lab work - fasting this week (CMP & Lipids)

## 2010-06-27 NOTE — Progress Notes (Signed)
HPI: [name] is a 77 y/o patient of Dr. Dietrich Cameron we are following for atrial fibrillation, hypertension, and hypercholesterolemia.  She Was last seen one year ago, and had been lost to follow-up prior to that visit. Coumadin is managed in our clinic in [place]. Medications for BP have been adjusted with follow-up BP check in July of 2011 after initiation of lisinopril 20 mg daily. Follow-up lab work was obtained with Creat of 0.89, normal LFT's and normal remainder of BMET and CBC.  Over this last year she has had no ER visits, no new allergies or diagnosis.  She has had a recent head cold which she is still recovering from. She is due for PT-INR tomorrow.  She has recently been established with Dr. Catalina Cameron.  She is without complaint of chest pain, shortness of breath, dizziness or fatigue. She denies hemoptysis, melena.  No Known Allergies  Current Outpatient Prescriptions  Medication Sig Dispense Refill  . ALPRAZolam (XANAX) 0.5 MG tablet Take 0.5 mg by mouth at bedtime as needed.        Marland Kitchen ascorbic Acid (VITAMIN C CR) 500 MG CPCR Take 500 mg by mouth daily.        . Multiple Vitamins-Minerals (CENTRUM SILVER PO) Take 1 tablet by mouth daily.        . Omega-3 Fatty Acids (FISH OIL) 1000 MG CAPS Take 1 capsule by mouth daily.        . TOPROL XL 25 MG 24 hr tablet TAKE 1 TABLET EVERY      MORNING  30 each  2  . warfarin (COUMADIN) 5 MG tablet Take by mouth as directed.        Marland Kitchen ZESTRIL 20 MG tablet TAKE 1 TABLET BY MOUTH   ONCE A DAY.  30 each  1  . ZOSTAVAX 11914 UNT/0.65ML injection         Past Medical History  Diagnosis Date  . Atrial fibrillation     permanent; onset in 1993; anticoagilation  . Hyperthyroidism 2000  . Hypertension     unspecified  . Drug therapy     Coumadin therapy  . Osteoporosis     Past Surgical History  Procedure Date  . Appendectomy   . Total abdominal hysterectomy w/ bilateral salpingoophorectomy     NWG:NFAOZH of systems complete and found to  be negative unless listed above PHYSICAL EXAM BP 116/74  Pulse 86  Ht 5\' 5"  (1.651 m)  Wt 127 lb (57.607 kg)  BMI 21.13 kg/m2  SpO2 92%  General: Well developed, well nourished, in no acute distress Head: Eyes PERRLA, No xanthomas.   Normal cephalic and atramatic  Lungs: Clear bilaterally to auscultation and percussion. Heart: Irregular rhythum,   Pulses are 2+ & equal.            No carotid bruit. No JVD.  No abdominal bruits. No femoral bruits. Abdomen: Bowel sounds are positive, abdomen soft and non-tender without masses or                  Hernia's noted. Msk:  Back normal, normal gait. Normal strength and tone for age. Extremities: No clubbing, cyanosis or edema.  DP +1 Neuro: Alert and oriented X 3. Psych:  Good affect, responds appropriately  EKG: Atrial flutter with variable block.  Rate of 59 bpm. RBBB.  ASSESSMENT AND PLAN

## 2010-06-27 NOTE — Assessment & Plan Note (Signed)
She is rate controlled at present and asymptomatic.  She is compliant with her medications and follow-up appointment for coumadin clinic. Will make no changes at this time and continue current medications.

## 2010-06-27 NOTE — Assessment & Plan Note (Signed)
Blood pressure is very well controlled on lisinopril and metoprolol.  Will check a CMET for evaluation of kidney fx on ACE inhibitor.  Will continue these medications.

## 2010-06-27 NOTE — Assessment & Plan Note (Signed)
She is newly followed by Dr. Catalina Pizza and has not had this checked recently. Will check these labs with copies to Dr. Margo Aye.

## 2010-06-28 ENCOUNTER — Ambulatory Visit (INDEPENDENT_AMBULATORY_CARE_PROVIDER_SITE_OTHER): Payer: Medicare Other | Admitting: *Deleted

## 2010-06-28 ENCOUNTER — Other Ambulatory Visit: Payer: Self-pay | Admitting: Adult Health

## 2010-06-28 DIAGNOSIS — I4891 Unspecified atrial fibrillation: Secondary | ICD-10-CM

## 2010-06-28 DIAGNOSIS — Z7901 Long term (current) use of anticoagulants: Secondary | ICD-10-CM

## 2010-06-28 LAB — POCT INR: INR: 2.3

## 2010-06-29 ENCOUNTER — Other Ambulatory Visit: Payer: Self-pay

## 2010-06-29 DIAGNOSIS — E785 Hyperlipidemia, unspecified: Secondary | ICD-10-CM

## 2010-06-29 LAB — COMPREHENSIVE METABOLIC PANEL
ALT: 11 U/L (ref 0–35)
Albumin: 4.1 g/dL (ref 3.5–5.2)
CO2: 28 mEq/L (ref 19–32)
Calcium: 9.9 mg/dL (ref 8.4–10.5)
Chloride: 101 mEq/L (ref 96–112)
Creat: 0.91 mg/dL (ref 0.50–1.10)
Potassium: 5 mEq/L (ref 3.5–5.3)
Sodium: 138 mEq/L (ref 135–145)
Total Protein: 6.3 g/dL (ref 6.0–8.3)

## 2010-06-29 LAB — LIPID PANEL: Cholesterol: 220 mg/dL — ABNORMAL HIGH (ref 0–200)

## 2010-06-29 MED ORDER — ATORVASTATIN CALCIUM 40 MG PO TABS: 40.0000 mg | ORAL_TABLET | Freq: Every day | ORAL | Status: DC

## 2010-07-06 ENCOUNTER — Encounter: Payer: Self-pay | Admitting: Adult Health

## 2010-07-17 ENCOUNTER — Other Ambulatory Visit: Payer: Self-pay | Admitting: Cardiology

## 2010-07-20 ENCOUNTER — Ambulatory Visit (INDEPENDENT_AMBULATORY_CARE_PROVIDER_SITE_OTHER): Payer: Medicare Other | Admitting: Otolaryngology

## 2010-07-20 DIAGNOSIS — R05 Cough: Secondary | ICD-10-CM

## 2010-07-26 ENCOUNTER — Ambulatory Visit (INDEPENDENT_AMBULATORY_CARE_PROVIDER_SITE_OTHER): Payer: Medicare Other | Admitting: *Deleted

## 2010-07-26 DIAGNOSIS — Z7901 Long term (current) use of anticoagulants: Secondary | ICD-10-CM

## 2010-07-26 DIAGNOSIS — I4891 Unspecified atrial fibrillation: Secondary | ICD-10-CM

## 2010-08-03 ENCOUNTER — Other Ambulatory Visit: Payer: Self-pay | Admitting: Cardiology

## 2010-08-14 ENCOUNTER — Other Ambulatory Visit: Payer: Self-pay | Admitting: Adult Health

## 2010-08-15 LAB — HEPATIC FUNCTION PANEL
ALT: 13 U/L (ref 0–35)
AST: 16 U/L (ref 0–37)
Total Protein: 6.6 g/dL (ref 6.0–8.3)

## 2010-08-15 LAB — LIPID PANEL
Cholesterol: 192 mg/dL (ref 0–200)
HDL: 46 mg/dL (ref >39)
Total CHOL/HDL Ratio: 4.2 Ratio
Triglycerides: 165 mg/dL — ABNORMAL HIGH (ref <150)
VLDL: 33 mg/dL (ref 0–40)

## 2010-08-19 ENCOUNTER — Other Ambulatory Visit: Payer: Self-pay | Admitting: Cardiology

## 2010-08-21 ENCOUNTER — Other Ambulatory Visit: Payer: Self-pay | Admitting: Cardiology

## 2010-08-23 ENCOUNTER — Ambulatory Visit (INDEPENDENT_AMBULATORY_CARE_PROVIDER_SITE_OTHER): Payer: Medicare Other | Admitting: *Deleted

## 2010-08-23 DIAGNOSIS — I4891 Unspecified atrial fibrillation: Secondary | ICD-10-CM

## 2010-08-23 DIAGNOSIS — Z7901 Long term (current) use of anticoagulants: Secondary | ICD-10-CM

## 2010-08-23 LAB — POCT INR: INR: 2.6

## 2010-09-02 ENCOUNTER — Other Ambulatory Visit: Payer: Self-pay | Admitting: Cardiology

## 2010-09-20 ENCOUNTER — Ambulatory Visit (INDEPENDENT_AMBULATORY_CARE_PROVIDER_SITE_OTHER): Payer: Medicare Other | Admitting: *Deleted

## 2010-09-20 DIAGNOSIS — I4891 Unspecified atrial fibrillation: Secondary | ICD-10-CM

## 2010-09-20 DIAGNOSIS — Z7901 Long term (current) use of anticoagulants: Secondary | ICD-10-CM

## 2010-09-20 LAB — POCT INR: INR: 3.5

## 2010-10-18 ENCOUNTER — Ambulatory Visit (INDEPENDENT_AMBULATORY_CARE_PROVIDER_SITE_OTHER): Payer: Medicare Other | Admitting: *Deleted

## 2010-10-18 DIAGNOSIS — I4891 Unspecified atrial fibrillation: Secondary | ICD-10-CM

## 2010-10-18 DIAGNOSIS — Z7901 Long term (current) use of anticoagulants: Secondary | ICD-10-CM

## 2010-10-23 ENCOUNTER — Other Ambulatory Visit: Payer: Self-pay | Admitting: Cardiology

## 2010-11-15 ENCOUNTER — Ambulatory Visit (INDEPENDENT_AMBULATORY_CARE_PROVIDER_SITE_OTHER): Payer: Medicare Other | Admitting: *Deleted

## 2010-11-15 DIAGNOSIS — I4891 Unspecified atrial fibrillation: Secondary | ICD-10-CM

## 2010-11-15 DIAGNOSIS — Z7901 Long term (current) use of anticoagulants: Secondary | ICD-10-CM

## 2010-11-18 ENCOUNTER — Other Ambulatory Visit: Payer: Self-pay | Admitting: Adult Health

## 2010-11-20 ENCOUNTER — Other Ambulatory Visit: Payer: Self-pay | Admitting: *Deleted

## 2010-12-14 ENCOUNTER — Ambulatory Visit (INDEPENDENT_AMBULATORY_CARE_PROVIDER_SITE_OTHER): Payer: Medicare Other | Admitting: *Deleted

## 2010-12-14 DIAGNOSIS — I4891 Unspecified atrial fibrillation: Secondary | ICD-10-CM

## 2010-12-14 DIAGNOSIS — Z7901 Long term (current) use of anticoagulants: Secondary | ICD-10-CM

## 2011-01-25 ENCOUNTER — Ambulatory Visit (INDEPENDENT_AMBULATORY_CARE_PROVIDER_SITE_OTHER): Payer: Medicare Other | Admitting: *Deleted

## 2011-01-25 DIAGNOSIS — Z7901 Long term (current) use of anticoagulants: Secondary | ICD-10-CM | POA: Diagnosis not present

## 2011-01-25 DIAGNOSIS — I4891 Unspecified atrial fibrillation: Secondary | ICD-10-CM

## 2011-02-12 ENCOUNTER — Other Ambulatory Visit: Payer: Self-pay | Admitting: Cardiology

## 2011-03-08 ENCOUNTER — Ambulatory Visit (INDEPENDENT_AMBULATORY_CARE_PROVIDER_SITE_OTHER): Payer: Medicare Other | Admitting: *Deleted

## 2011-03-08 DIAGNOSIS — I4891 Unspecified atrial fibrillation: Secondary | ICD-10-CM

## 2011-03-08 DIAGNOSIS — Z7901 Long term (current) use of anticoagulants: Secondary | ICD-10-CM | POA: Diagnosis not present

## 2011-03-08 LAB — POCT INR: INR: 2.7

## 2011-03-16 ENCOUNTER — Other Ambulatory Visit: Payer: Self-pay | Admitting: Cardiology

## 2011-04-19 ENCOUNTER — Ambulatory Visit (INDEPENDENT_AMBULATORY_CARE_PROVIDER_SITE_OTHER): Payer: BLUE CROSS/BLUE SHIELD | Admitting: *Deleted

## 2011-04-19 DIAGNOSIS — Z7901 Long term (current) use of anticoagulants: Secondary | ICD-10-CM

## 2011-04-19 DIAGNOSIS — I4891 Unspecified atrial fibrillation: Secondary | ICD-10-CM | POA: Diagnosis not present

## 2011-04-19 LAB — POCT INR: INR: 3.4

## 2011-05-14 ENCOUNTER — Other Ambulatory Visit: Payer: Self-pay | Admitting: Adult Health

## 2011-05-15 NOTE — Telephone Encounter (Signed)
?   Is Rothbart following Lipids or is Dr. Margo Aye?

## 2011-05-16 DIAGNOSIS — I4891 Unspecified atrial fibrillation: Secondary | ICD-10-CM | POA: Diagnosis not present

## 2011-05-16 DIAGNOSIS — I10 Essential (primary) hypertension: Secondary | ICD-10-CM | POA: Diagnosis not present

## 2011-05-16 DIAGNOSIS — E785 Hyperlipidemia, unspecified: Secondary | ICD-10-CM | POA: Diagnosis not present

## 2011-05-17 ENCOUNTER — Ambulatory Visit (INDEPENDENT_AMBULATORY_CARE_PROVIDER_SITE_OTHER): Payer: Medicare Other | Admitting: *Deleted

## 2011-05-17 DIAGNOSIS — Z7901 Long term (current) use of anticoagulants: Secondary | ICD-10-CM | POA: Diagnosis not present

## 2011-05-17 DIAGNOSIS — I4891 Unspecified atrial fibrillation: Secondary | ICD-10-CM | POA: Diagnosis not present

## 2011-06-14 ENCOUNTER — Ambulatory Visit (INDEPENDENT_AMBULATORY_CARE_PROVIDER_SITE_OTHER): Payer: Medicare Other | Admitting: *Deleted

## 2011-06-14 ENCOUNTER — Encounter: Payer: Self-pay | Admitting: Cardiology

## 2011-06-14 DIAGNOSIS — I4891 Unspecified atrial fibrillation: Secondary | ICD-10-CM | POA: Diagnosis not present

## 2011-06-14 DIAGNOSIS — Z7901 Long term (current) use of anticoagulants: Secondary | ICD-10-CM | POA: Diagnosis not present

## 2011-07-16 ENCOUNTER — Other Ambulatory Visit: Payer: Self-pay | Admitting: Cardiology

## 2011-07-18 ENCOUNTER — Ambulatory Visit (INDEPENDENT_AMBULATORY_CARE_PROVIDER_SITE_OTHER): Payer: Medicare Other | Admitting: *Deleted

## 2011-07-18 DIAGNOSIS — I4891 Unspecified atrial fibrillation: Secondary | ICD-10-CM | POA: Diagnosis not present

## 2011-07-18 DIAGNOSIS — Z7901 Long term (current) use of anticoagulants: Secondary | ICD-10-CM

## 2011-07-18 LAB — POCT INR: INR: 3.3

## 2011-08-15 ENCOUNTER — Ambulatory Visit (INDEPENDENT_AMBULATORY_CARE_PROVIDER_SITE_OTHER): Payer: Medicare Other | Admitting: *Deleted

## 2011-08-15 DIAGNOSIS — I4891 Unspecified atrial fibrillation: Secondary | ICD-10-CM

## 2011-08-15 DIAGNOSIS — Z7901 Long term (current) use of anticoagulants: Secondary | ICD-10-CM

## 2011-08-30 ENCOUNTER — Ambulatory Visit (INDEPENDENT_AMBULATORY_CARE_PROVIDER_SITE_OTHER): Payer: Medicare Other | Admitting: *Deleted

## 2011-08-30 DIAGNOSIS — I4891 Unspecified atrial fibrillation: Secondary | ICD-10-CM

## 2011-08-30 DIAGNOSIS — Z7901 Long term (current) use of anticoagulants: Secondary | ICD-10-CM

## 2011-09-05 ENCOUNTER — Encounter: Payer: Self-pay | Admitting: Cardiology

## 2011-09-05 ENCOUNTER — Ambulatory Visit (INDEPENDENT_AMBULATORY_CARE_PROVIDER_SITE_OTHER): Payer: Medicare Other | Admitting: Physician Assistant

## 2011-09-05 VITALS — BP 118/68 | HR 76 | Ht 65.5 in | Wt 124.1 lb

## 2011-09-05 DIAGNOSIS — Z7901 Long term (current) use of anticoagulants: Secondary | ICD-10-CM | POA: Diagnosis not present

## 2011-09-05 DIAGNOSIS — I4891 Unspecified atrial fibrillation: Secondary | ICD-10-CM | POA: Diagnosis not present

## 2011-09-05 DIAGNOSIS — I4821 Permanent atrial fibrillation: Secondary | ICD-10-CM | POA: Insufficient documentation

## 2011-09-05 DIAGNOSIS — I10 Essential (primary) hypertension: Secondary | ICD-10-CM

## 2011-09-05 DIAGNOSIS — E0590 Thyrotoxicosis, unspecified without thyrotoxic crisis or storm: Secondary | ICD-10-CM | POA: Insufficient documentation

## 2011-09-05 NOTE — Assessment & Plan Note (Addendum)
Chronic atrial fibrillation with controlled ventricular rate. Patient is on Coumadin and has her INRs checked regularly.  She remains asymptomatic with respect to her arrhythmia.  The strategy of control of heart rate and chronic anticoagulation has been extremely successful over the past 20 years and will be continued.

## 2011-09-05 NOTE — Assessment & Plan Note (Signed)
On Coumadin and checked regularly.

## 2011-09-05 NOTE — Progress Notes (Deleted)
Name: Renee Cameron    DOB: 09/16/1924  Age: 78 y.o.  MR#: [id number]       PCP:  Dwana Melena, MD      Insurance: @PAYORNAME @   CC:   No chief complaint on file.   VS BP 118/68  Pulse 76  Ht 5' 5.5" (1.664 m)  Wt 124 lb 1.9 oz (56.3 kg)  BMI 20.34 kg/m2  Weights Current Weight  09/05/11 124 lb 1.9 oz (56.3 kg)  06/27/10 127 lb (57.607 kg)  07/13/09 128 lb (58.06 kg)    Blood Pressure  BP Readings from Last 3 Encounters:  09/05/11 118/68  06/27/10 116/74  07/13/09 118/77     Admit date:  (Not on file) Last encounter with RMR:  07/16/2011   Allergy No Known Allergies  Current Outpatient Prescriptions  Medication Sig Dispense Refill  . ALPRAZolam (XANAX) 0.5 MG tablet Take 0.5 mg by mouth at bedtime as needed.        Marland Kitchen ascorbic Acid (VITAMIN C CR) 500 MG CPCR Take 500 mg by mouth daily.        Marland Kitchen COUMADIN 5 MG tablet TAKE 1 TABLET DAILY AS DIRECTED BY COUMADIN CLINIC.  45 each  1  . LIPITOR 40 MG tablet TAKE (1) TABLET BY MOUTH AT BEDTIME.  30 each  12  . Multiple Vitamins-Minerals (CENTRUM SILVER PO) Take 1 tablet by mouth daily.        . Omega-3 Fatty Acids (FISH OIL) 1000 MG CAPS Take 1 capsule by mouth daily.        . TOPROL XL 25 MG 24 hr tablet TAKE 1 TABLET EVERY MORNING  30 each  6  . ZESTRIL 20 MG tablet TAKE 1 TABLET BY MOUTH ONCE A DAY.  30 each  6    Discontinued Meds:    Medications Discontinued During This Encounter  Medication Reason  . ZOSTAVAX 95621 UNT/0.65ML injection Error    Patient Active Problem List  Diagnosis  . Atrial fibrillation  . Hyperthyroidism  . Hypertension  . Chronic anticoagulation    LABS Anti-coag visit on 08/30/2011  Component Date Value  . INR 08/30/2011 1.7   Anti-coag visit on 08/15/2011  Component Date Value  . INR 08/15/2011 3.6   Anti-coag visit on 07/18/2011  Component Date Value  . INR 07/18/2011 3.3   Anti-coag visit on 06/14/2011  Component Date Value  . INR 06/14/2011 2.3      Results for this Opt  Visit:     Results for orders placed in visit on 08/30/11  POCT INR      Component Value Range   INR 1.7      EKG Orders placed in visit on 07/06/10  . EKG 12-LEAD     Prior Assessment and Plan Problem List as of 09/05/2011            Cardiology Problems   Atrial fibrillation   Hypertension     Other   Hyperthyroidism   Chronic anticoagulation       Imaging: No results found.   FRS Calculation: Score not calculated

## 2011-09-05 NOTE — Patient Instructions (Addendum)
Your physician recommends that you schedule a follow-up appointment in: 1 year  Stools x 3 and return to office

## 2011-09-05 NOTE — Progress Notes (Signed)
HPI:  This is a very pleasant 78 year old white female patient who is here for her yearly followup for atrial fibrillation and hypertension. She has done extremely well without cardiac complaints. She remains very active doing water aerobics. She denies chest pain, palpitations, dyspnea, dyspnea on exertion, dizziness, or presyncope. Her main complaint is insomnia at night. She does not sleep during the day or drink excessive caffeine. She is followed by Dr. Margo Aye and had blood work done in May that all is within normal range including lipids, and TSH.  No Known Allergies  Current Outpatient Prescriptions on File Prior to Visit: ALPRAZolam (XANAX) 0.5 MG tablet, Take 0.5 mg by mouth at bedtime as needed.  , Disp: , Rfl:  ascorbic Acid (VITAMIN C CR) 500 MG CPCR, Take 500 mg by mouth daily.  , Disp: , Rfl:  COUMADIN 5 MG tablet, TAKE 1 TABLET DAILY AS DIRECTED BY COUMADIN CLINIC., Disp: 45 each, Rfl: 1 LIPITOR 40 MG tablet, TAKE (1) TABLET BY MOUTH AT BEDTIME., Disp: 30 each, Rfl: 12 Multiple Vitamins-Minerals (CENTRUM SILVER PO), Take 1 tablet by mouth daily.  , Disp: , Rfl:  Omega-3 Fatty Acids (FISH OIL) 1000 MG CAPS, Take 1 capsule by mouth daily.  , Disp: , Rfl:  TOPROL XL 25 MG 24 hr tablet, TAKE 1 TABLET EVERY MORNING, Disp: 30 each, Rfl: 6 ZESTRIL 20 MG tablet, TAKE 1 TABLET BY MOUTH ONCE A DAY., Disp: 30 each, Rfl: 6    Past Medical History:   Atrial fibrillation                             1993           Comment:permanent; onset in 1993   Hyperthyroidism                                 2000           Comment:2000   Hypertension                                                   Comment:unspecified   Osteoporosis                                                 Chronic anticoagulation                                     Past Surgical History:   APPENDECTOMY                                                 TOTAL ABDOMINAL HYSTERECTOMY W/ BILATERAL SALP*             Review of  patient's family history indicates:   Prostate cancer                Father  Stroke                         Father                   Heart disease                  Mother                   Coronary artery disease        Sister                     Comment: with prior CABG surgery   Social History   Marital Status: Widowed             Spouse Name:                      Years of Education:                 Number of children:             Occupational History Occupation          Associate Professor            Comment              Retired                                 [employer]                                          Company  Social History Main Topics   Smoking Status: Never Smoker                     Smokeless Status: Not on file                      Alcohol Use: No             Drug Use: No             Sexual Activity: Not on file        Other Topics            Concern   None on file  Social History Narrative   Volunteers 2 days weekly    ROS:See history of present illness otherwise negative   PHYSICAL EXAM: Well-nournished, in no acute distress, looks young for stated age. Neck: No JVD, HJR, Bruit, or thyroid enlargement  Lungs: No tachypnea, clear without wheezing, rales, or rhonchi  Cardiovascular:irregular irregular, PMI not displaced, 2/6 systolic murmur at the left her home border, no bruit, thrill, or heave.  Abdomen: BS normal. Soft without organomegaly, masses, lesions or tenderness.  Extremities: without cyanosis, clubbing or edema. Good distal pulses bilateral  SKin: Warm, no lesions or rashes   Musculoskeletal: No deformities  Neuro: no focal signs  BP 118/68  Pulse 76  Ht 5' 5.5" (1.664 m)  Wt 124 lb 1.9 oz (56.3 kg)  BMI 20.34 kg/m2

## 2011-09-05 NOTE — Assessment & Plan Note (Addendum)
Blood pressure control excellent with modest medication, which will be continued.

## 2011-09-11 ENCOUNTER — Other Ambulatory Visit: Payer: Self-pay | Admitting: Cardiology

## 2011-09-13 ENCOUNTER — Ambulatory Visit (INDEPENDENT_AMBULATORY_CARE_PROVIDER_SITE_OTHER): Payer: Medicare Other | Admitting: *Deleted

## 2011-09-13 DIAGNOSIS — I4891 Unspecified atrial fibrillation: Secondary | ICD-10-CM | POA: Diagnosis not present

## 2011-09-13 DIAGNOSIS — Z7901 Long term (current) use of anticoagulants: Secondary | ICD-10-CM

## 2011-09-21 ENCOUNTER — Encounter (INDEPENDENT_AMBULATORY_CARE_PROVIDER_SITE_OTHER): Payer: Medicare Other | Admitting: *Deleted

## 2011-09-21 ENCOUNTER — Other Ambulatory Visit (INDEPENDENT_AMBULATORY_CARE_PROVIDER_SITE_OTHER): Payer: Medicare Other | Admitting: *Deleted

## 2011-09-21 DIAGNOSIS — Z7901 Long term (current) use of anticoagulants: Secondary | ICD-10-CM

## 2011-09-21 DIAGNOSIS — R0989 Other specified symptoms and signs involving the circulatory and respiratory systems: Secondary | ICD-10-CM

## 2011-09-21 LAB — POC HEMOCCULT BLD/STL (HOME/3-CARD/SCREEN): Card #2 Fecal Occult Blod, POC: NEGATIVE

## 2011-10-01 DIAGNOSIS — Z23 Encounter for immunization: Secondary | ICD-10-CM | POA: Diagnosis not present

## 2011-10-11 ENCOUNTER — Ambulatory Visit (INDEPENDENT_AMBULATORY_CARE_PROVIDER_SITE_OTHER): Payer: Medicare Other | Admitting: *Deleted

## 2011-10-11 DIAGNOSIS — I4891 Unspecified atrial fibrillation: Secondary | ICD-10-CM | POA: Diagnosis not present

## 2011-10-11 DIAGNOSIS — Z7901 Long term (current) use of anticoagulants: Secondary | ICD-10-CM

## 2011-11-01 ENCOUNTER — Ambulatory Visit (INDEPENDENT_AMBULATORY_CARE_PROVIDER_SITE_OTHER): Payer: Medicare Other | Admitting: *Deleted

## 2011-11-01 DIAGNOSIS — Z7901 Long term (current) use of anticoagulants: Secondary | ICD-10-CM | POA: Diagnosis not present

## 2011-11-01 DIAGNOSIS — I4891 Unspecified atrial fibrillation: Secondary | ICD-10-CM

## 2011-11-01 LAB — POCT INR: INR: 2.1

## 2011-11-27 DIAGNOSIS — E785 Hyperlipidemia, unspecified: Secondary | ICD-10-CM | POA: Diagnosis not present

## 2011-11-27 DIAGNOSIS — E782 Mixed hyperlipidemia: Secondary | ICD-10-CM | POA: Diagnosis not present

## 2011-11-27 DIAGNOSIS — G4700 Insomnia, unspecified: Secondary | ICD-10-CM | POA: Diagnosis not present

## 2011-11-27 DIAGNOSIS — R7301 Impaired fasting glucose: Secondary | ICD-10-CM | POA: Diagnosis not present

## 2011-11-27 DIAGNOSIS — I10 Essential (primary) hypertension: Secondary | ICD-10-CM | POA: Diagnosis not present

## 2011-11-27 DIAGNOSIS — I4891 Unspecified atrial fibrillation: Secondary | ICD-10-CM | POA: Diagnosis not present

## 2011-11-28 ENCOUNTER — Ambulatory Visit (INDEPENDENT_AMBULATORY_CARE_PROVIDER_SITE_OTHER): Payer: Medicare Other | Admitting: *Deleted

## 2011-11-28 DIAGNOSIS — I4891 Unspecified atrial fibrillation: Secondary | ICD-10-CM

## 2011-11-28 DIAGNOSIS — Z7901 Long term (current) use of anticoagulants: Secondary | ICD-10-CM | POA: Diagnosis not present

## 2011-12-05 ENCOUNTER — Encounter: Payer: Self-pay | Admitting: Cardiology

## 2011-12-19 ENCOUNTER — Ambulatory Visit (INDEPENDENT_AMBULATORY_CARE_PROVIDER_SITE_OTHER): Payer: Medicare Other | Admitting: *Deleted

## 2011-12-19 DIAGNOSIS — I4891 Unspecified atrial fibrillation: Secondary | ICD-10-CM | POA: Diagnosis not present

## 2011-12-19 DIAGNOSIS — Z7901 Long term (current) use of anticoagulants: Secondary | ICD-10-CM | POA: Diagnosis not present

## 2012-01-16 ENCOUNTER — Ambulatory Visit (INDEPENDENT_AMBULATORY_CARE_PROVIDER_SITE_OTHER): Payer: Medicare Other | Admitting: *Deleted

## 2012-01-16 DIAGNOSIS — I4891 Unspecified atrial fibrillation: Secondary | ICD-10-CM | POA: Diagnosis not present

## 2012-01-16 DIAGNOSIS — Z7901 Long term (current) use of anticoagulants: Secondary | ICD-10-CM

## 2012-02-09 ENCOUNTER — Other Ambulatory Visit: Payer: Self-pay | Admitting: Cardiology

## 2012-02-13 ENCOUNTER — Ambulatory Visit (INDEPENDENT_AMBULATORY_CARE_PROVIDER_SITE_OTHER): Payer: Medicare Other | Admitting: *Deleted

## 2012-02-13 DIAGNOSIS — Z7901 Long term (current) use of anticoagulants: Secondary | ICD-10-CM

## 2012-02-13 DIAGNOSIS — I4891 Unspecified atrial fibrillation: Secondary | ICD-10-CM | POA: Diagnosis not present

## 2012-03-05 ENCOUNTER — Ambulatory Visit (INDEPENDENT_AMBULATORY_CARE_PROVIDER_SITE_OTHER): Payer: Medicare Other | Admitting: *Deleted

## 2012-03-05 DIAGNOSIS — Z7901 Long term (current) use of anticoagulants: Secondary | ICD-10-CM | POA: Diagnosis not present

## 2012-03-05 DIAGNOSIS — I4891 Unspecified atrial fibrillation: Secondary | ICD-10-CM | POA: Diagnosis not present

## 2012-03-05 LAB — POCT INR: INR: 2.2

## 2012-03-08 ENCOUNTER — Other Ambulatory Visit: Payer: Self-pay | Admitting: Cardiology

## 2012-03-10 ENCOUNTER — Other Ambulatory Visit: Payer: Self-pay | Admitting: Cardiology

## 2012-03-10 MED ORDER — METOPROLOL SUCCINATE ER 25 MG PO TB24: 25.0000 mg | ORAL_TABLET | Freq: Every day | ORAL | Status: DC

## 2012-04-02 ENCOUNTER — Ambulatory Visit (INDEPENDENT_AMBULATORY_CARE_PROVIDER_SITE_OTHER): Payer: Medicare Other | Admitting: *Deleted

## 2012-04-02 DIAGNOSIS — I4891 Unspecified atrial fibrillation: Secondary | ICD-10-CM | POA: Diagnosis not present

## 2012-04-02 DIAGNOSIS — Z7901 Long term (current) use of anticoagulants: Secondary | ICD-10-CM | POA: Diagnosis not present

## 2012-04-02 LAB — POCT INR: INR: 1.9

## 2012-04-23 ENCOUNTER — Ambulatory Visit (INDEPENDENT_AMBULATORY_CARE_PROVIDER_SITE_OTHER): Payer: Medicare Other | Admitting: *Deleted

## 2012-04-23 DIAGNOSIS — Z7901 Long term (current) use of anticoagulants: Secondary | ICD-10-CM | POA: Diagnosis not present

## 2012-04-23 DIAGNOSIS — I4891 Unspecified atrial fibrillation: Secondary | ICD-10-CM | POA: Diagnosis not present

## 2012-04-27 ENCOUNTER — Other Ambulatory Visit: Payer: Self-pay | Admitting: Cardiology

## 2012-05-14 ENCOUNTER — Ambulatory Visit (INDEPENDENT_AMBULATORY_CARE_PROVIDER_SITE_OTHER): Payer: Medicare Other | Admitting: *Deleted

## 2012-05-14 DIAGNOSIS — I4891 Unspecified atrial fibrillation: Secondary | ICD-10-CM | POA: Diagnosis not present

## 2012-05-14 DIAGNOSIS — Z7901 Long term (current) use of anticoagulants: Secondary | ICD-10-CM | POA: Diagnosis not present

## 2012-05-27 DIAGNOSIS — D2330 Other benign neoplasm of skin of unspecified part of face: Secondary | ICD-10-CM | POA: Diagnosis not present

## 2012-05-27 DIAGNOSIS — L723 Sebaceous cyst: Secondary | ICD-10-CM | POA: Diagnosis not present

## 2012-05-27 DIAGNOSIS — L821 Other seborrheic keratosis: Secondary | ICD-10-CM | POA: Diagnosis not present

## 2012-05-27 DIAGNOSIS — D1801 Hemangioma of skin and subcutaneous tissue: Secondary | ICD-10-CM | POA: Diagnosis not present

## 2012-06-04 ENCOUNTER — Ambulatory Visit (INDEPENDENT_AMBULATORY_CARE_PROVIDER_SITE_OTHER): Payer: Medicare Other | Admitting: *Deleted

## 2012-06-04 DIAGNOSIS — I4891 Unspecified atrial fibrillation: Secondary | ICD-10-CM

## 2012-06-04 DIAGNOSIS — Z7901 Long term (current) use of anticoagulants: Secondary | ICD-10-CM | POA: Diagnosis not present

## 2012-06-04 LAB — POCT INR: INR: 2.1

## 2012-06-06 DIAGNOSIS — H524 Presbyopia: Secondary | ICD-10-CM | POA: Diagnosis not present

## 2012-06-06 DIAGNOSIS — H52229 Regular astigmatism, unspecified eye: Secondary | ICD-10-CM | POA: Diagnosis not present

## 2012-06-06 DIAGNOSIS — H5200 Hypermetropia, unspecified eye: Secondary | ICD-10-CM | POA: Diagnosis not present

## 2012-06-06 DIAGNOSIS — H353190 Nonexudative age-related macular degeneration, unspecified eye, stage unspecified: Secondary | ICD-10-CM | POA: Diagnosis not present

## 2012-06-26 ENCOUNTER — Other Ambulatory Visit: Payer: Self-pay | Admitting: Cardiology

## 2012-07-02 ENCOUNTER — Ambulatory Visit (INDEPENDENT_AMBULATORY_CARE_PROVIDER_SITE_OTHER): Payer: Medicare Other | Admitting: *Deleted

## 2012-07-02 DIAGNOSIS — Z7901 Long term (current) use of anticoagulants: Secondary | ICD-10-CM

## 2012-07-02 DIAGNOSIS — I4891 Unspecified atrial fibrillation: Secondary | ICD-10-CM | POA: Diagnosis not present

## 2012-07-02 LAB — POCT INR: INR: 2.9

## 2012-07-03 ENCOUNTER — Other Ambulatory Visit: Payer: Self-pay | Admitting: Cardiology

## 2012-07-24 ENCOUNTER — Other Ambulatory Visit: Payer: Self-pay | Admitting: Cardiology

## 2012-07-26 ENCOUNTER — Other Ambulatory Visit: Payer: Self-pay | Admitting: Cardiology

## 2012-07-31 ENCOUNTER — Ambulatory Visit (INDEPENDENT_AMBULATORY_CARE_PROVIDER_SITE_OTHER): Payer: Medicare Other | Admitting: *Deleted

## 2012-07-31 DIAGNOSIS — I4891 Unspecified atrial fibrillation: Secondary | ICD-10-CM | POA: Diagnosis not present

## 2012-07-31 DIAGNOSIS — Z7901 Long term (current) use of anticoagulants: Secondary | ICD-10-CM | POA: Diagnosis not present

## 2012-07-31 LAB — POCT INR: INR: 2.8

## 2012-09-11 ENCOUNTER — Ambulatory Visit (INDEPENDENT_AMBULATORY_CARE_PROVIDER_SITE_OTHER): Payer: Medicare Other | Admitting: *Deleted

## 2012-09-11 DIAGNOSIS — I4891 Unspecified atrial fibrillation: Secondary | ICD-10-CM

## 2012-09-11 DIAGNOSIS — Z7901 Long term (current) use of anticoagulants: Secondary | ICD-10-CM | POA: Diagnosis not present

## 2012-09-17 ENCOUNTER — Ambulatory Visit (INDEPENDENT_AMBULATORY_CARE_PROVIDER_SITE_OTHER): Payer: Medicare Other | Admitting: Cardiovascular Disease

## 2012-09-17 VITALS — BP 142/94 | HR 84 | Ht 64.0 in | Wt 125.0 lb

## 2012-09-17 DIAGNOSIS — I4891 Unspecified atrial fibrillation: Secondary | ICD-10-CM | POA: Diagnosis not present

## 2012-09-17 DIAGNOSIS — Z7901 Long term (current) use of anticoagulants: Secondary | ICD-10-CM | POA: Diagnosis not present

## 2012-09-17 DIAGNOSIS — I10 Essential (primary) hypertension: Secondary | ICD-10-CM | POA: Diagnosis not present

## 2012-09-17 NOTE — Patient Instructions (Addendum)
Your physician recommends that you schedule a follow-up appointment in: 1 year with Dr Koneswaran You will receive a reminder letter two months in advance reminding you to call and schedule your appointment. If you don't receive this letter, please contact our office.  Your physician recommends that you continue on your current medications as directed. Please refer to the Current Medication list given to you today.     

## 2012-09-17 NOTE — Progress Notes (Signed)
Patient ID: Renee Cameron, female   DOB: 04-22-1924, 79 y.o.   MRN: [id number]   SUBJECTIVE: Renee Cameron has a h/o permanent atrial fibrillation and HTN, and takes Metoprolol and Warfarin for this.  The patient denies any symptoms of chest pain, palpitations, shortness of breath, lightheadedness, dizziness, leg swelling, orthopnea, PND, and syncope.  She goes to the [facility] and does water aerobics 2-3 days a week, and remains active otherwise.  Her husband passed away 7 years ago.  She has an adopted daughter who lives in the area.    No Known Allergies  Current Outpatient Prescriptions  Medication Sig Dispense Refill  . ALPRAZolam (XANAX) 0.5 MG tablet Take 0.5 mg by mouth at bedtime as needed.        Marland Kitchen ascorbic Acid (VITAMIN C CR) 500 MG CPCR Take 500 mg by mouth daily.        Marland Kitchen atorvastatin (LIPITOR) 40 MG tablet TAKE 1 TABLET BY MOUTH EVERY NIGHT AT BEDTIME  30 tablet  4  . COUMADIN 5 MG tablet TAKE 1 TABLET BY MOUTH EVERY DAY OR AS DIRECTED BY COUMADIN CLINIC  45 tablet  3  . lisinopril (PRINIVIL,ZESTRIL) 20 MG tablet TAKE 1 TABLET BY MOUTH EVERY DAY  30 tablet  0  . metoprolol succinate (TOPROL-XL) 25 MG 24 hr tablet Take 1 tablet (25 mg total) by mouth daily.  30 tablet  5  . Multiple Vitamins-Minerals (CENTRUM SILVER PO) Take 1 tablet by mouth daily.        . Omega-3 Fatty Acids (FISH OIL) 1000 MG CAPS Take 1 capsule by mouth daily.         No current facility-administered medications for this visit.    Past Medical History  Diagnosis Date  . Atrial fibrillation 1993    permanent; onset in 1993  . Hyperthyroidism 2000    2000  . Hypertension     unspecified  . Osteoporosis   . Chronic anticoagulation     Past Surgical History  Procedure Laterality Date  . Appendectomy    . Total abdominal hysterectomy w/ bilateral salpingoophorectomy      History   Social History  . Marital Status: Widowed    Spouse Name: N/A    Number of Children: N/A  . Years of  Education: N/A   Occupational History  . Retired     [employer]   Social History Main Topics  . Smoking status: Never Smoker   . Smokeless tobacco: Not on file  . Alcohol Use: No  . Drug Use: No  . Sexual Activity: Not on file   Other Topics Concern  . Not on file   Social History Narrative   Volunteers 2 days weekly     Filed Vitals:   09/17/12 1320  Height: 5\' 4"  (1.626 m)  Weight: 125 lb (56.7 kg)   BP 142/94 Pulse 84   PHYSICAL EXAM General: NAD Neck: No JVD, no thyromegaly or thyroid nodule.  Lungs: Clear to auscultation bilaterally with normal respiratory effort. CV: Nondisplaced PMI.  Heart irregular rhythm, with normal S1/S2, no S3/S4, I/VI holosystolic murmur.  No peripheral edema.  No carotid bruit.  Normal pedal pulses.  Abdomen: Soft, nontender, no hepatosplenomegaly, no distention.  Neurologic: Alert and oriented x 3.  Psych: Normal affect. Extremities: No clubbing or cyanosis.   ECG: reviewed and available in electronic records.      ASSESSMENT AND PLAN: 1. Permanent atrial fibrillation: rate controlled on current dose of Metoprolol. She is  asymptomatic with respect to her arrhythmia. She is maintained on Warfarin without complications. 2. HTN: relatively well controlled on Lisinopril. 3. Hyperlipidemia: on Lipitor.   Prentice Docker, M.D., F.A.C.C.

## 2012-10-22 ENCOUNTER — Other Ambulatory Visit: Payer: Self-pay | Admitting: Cardiology

## 2012-10-23 ENCOUNTER — Ambulatory Visit (INDEPENDENT_AMBULATORY_CARE_PROVIDER_SITE_OTHER): Payer: Medicare Other | Admitting: *Deleted

## 2012-10-23 DIAGNOSIS — I4891 Unspecified atrial fibrillation: Secondary | ICD-10-CM | POA: Diagnosis not present

## 2012-10-23 DIAGNOSIS — Z7901 Long term (current) use of anticoagulants: Secondary | ICD-10-CM | POA: Diagnosis not present

## 2012-10-23 DIAGNOSIS — Z23 Encounter for immunization: Secondary | ICD-10-CM | POA: Diagnosis not present

## 2012-10-23 LAB — POCT INR: INR: 2.4

## 2012-11-30 ENCOUNTER — Other Ambulatory Visit: Payer: Self-pay | Admitting: Cardiology

## 2012-12-05 ENCOUNTER — Emergency Department (INDEPENDENT_AMBULATORY_CARE_PROVIDER_SITE_OTHER)
Admission: EM | Admit: 2012-12-05 | Discharge: 2012-12-05 | Disposition: A | Discharge status: Home / Self Care | Payer: Medicare Other | Source: Home / Self Care | Attending: Emergency Medicine | Admitting: Emergency Medicine

## 2012-12-05 ENCOUNTER — Encounter (HOSPITAL_COMMUNITY): Payer: Self-pay | Admitting: Emergency Medicine

## 2012-12-05 ENCOUNTER — Emergency Department (INDEPENDENT_AMBULATORY_CARE_PROVIDER_SITE_OTHER): Payer: Medicare Other

## 2012-12-05 DIAGNOSIS — K5289 Other specified noninfective gastroenteritis and colitis: Secondary | ICD-10-CM | POA: Diagnosis not present

## 2012-12-05 DIAGNOSIS — K529 Noninfective gastroenteritis and colitis, unspecified: Secondary | ICD-10-CM

## 2012-12-05 DIAGNOSIS — E876 Hypokalemia: Secondary | ICD-10-CM | POA: Diagnosis not present

## 2012-12-05 DIAGNOSIS — J40 Bronchitis, not specified as acute or chronic: Secondary | ICD-10-CM | POA: Diagnosis not present

## 2012-12-05 LAB — CBC WITH DIFFERENTIAL/PLATELET
Basophils Absolute: 0 10*3/uL (ref 0.0–0.1)
Basophils Relative: 0 % (ref 0–1)
MCHC: 34.1 g/dL (ref 30.0–36.0)
Neutro Abs: 2.9 10*3/uL (ref 1.7–7.7)
Neutrophils Relative %: 67 % (ref 43–77)
Platelets: 151 10*3/uL (ref 150–400)
RDW: 13.7 % (ref 11.5–15.5)

## 2012-12-05 LAB — POCT I-STAT, CHEM 8
BUN: 10 mg/dL (ref 6–23)
Chloride: 93 mEq/L — ABNORMAL LOW (ref 96–112)
Creatinine, Ser: 1 mg/dL (ref 0.50–1.10)
Sodium: 134 mEq/L — ABNORMAL LOW (ref 135–145)
TCO2: 24 mmol/L (ref 0–100)

## 2012-12-05 MED ORDER — ONDANSETRON 8 MG PO TBDP: 8.0000 mg | ORAL_TABLET | Freq: Three times a day (TID) | ORAL | Status: DC | PRN

## 2012-12-05 MED ORDER — ONDANSETRON 4 MG PO TBDP
ORAL_TABLET | ORAL | Status: AC
  Filled 2012-12-05: qty 2

## 2012-12-05 MED ORDER — RANITIDINE HCL 150 MG PO TABS: 150.0000 mg | ORAL_TABLET | Freq: Two times a day (BID) | ORAL | Status: DC

## 2012-12-05 MED ORDER — POTASSIUM CHLORIDE ER 10 MEQ PO TBCR: 10.0000 meq | EXTENDED_RELEASE_TABLET | Freq: Every day | ORAL | Status: DC

## 2012-12-05 MED ORDER — ONDANSETRON 4 MG PO TBDP
8.0000 mg | ORAL_TABLET | Freq: Once | ORAL | Status: AC
  Administered 2012-12-05: 8 mg via ORAL

## 2012-12-05 NOTE — ED Notes (Signed)
C/o nausea, vomiting since 11-26, eating chicken soup Wednesday, vomited; soda and crackers only yesterday

## 2012-12-05 NOTE — ED Provider Notes (Signed)
Chief Complaint:   Chief Complaint  Patient presents with  . Nausea    History of Present Illness:   Renee Cameron is an 79 year old female who has had a three-day history of nausea. She vomited once. She did not have any diarrhea. She denies any abdominal pain or fever. She had a mild cough when this first began but now is better. She denies any blood in the vomitus. She's had no melena. Her bowel movements have been regular. No chest pain or shortness of breath. She denies any URI symptoms. No suspicious exposures or ingestions. No recent travel or animal exposure.  Review of Systems:  Other than noted above, the patient denies any of the following symptoms: Systemic:  No fevers, chills, sweats, weight loss or gain, fatigue, or tiredness. ENT:  No nasal congestion, rhinorrhea, or sore throat. Lungs:  No cough, wheezing, or shortness of breath. Cardiac:  No chest pain, syncope, or presyncope. GI:  No abdominal pain, nausea, vomiting, anorexia, diarrhea, constipation, blood in stool or vomitus. GU:  No dysuria, frequency, or urgency.  PMFSH:  Past medical history, family history, social history, meds, and allergies were reviewed.  She has no medication allergies. She takes alprazolam, Lipitor, Coumadin, lisinopril, and metoprolol. She has a history of a true fibrillation, hypothyroidism, hypertension, and osteoporosis.  Physical Exam:   Vital signs:  BP 162/98  Pulse 103  Temp(Src) 98 F (36.7 C) (Oral)  Resp 18  SpO2 97% General:  Alert and oriented.  In no distress.  Skin warm and dry.  Good skin turgor, brisk capillary refill. ENT:  No scleral icterus, moist mucous membranes, no oral lesions, pharynx clear. Lungs:  Breath sounds clear and equal bilaterally.  No wheezes, rales, or rhonchi. Heart:  Rhythm regular, without extrasystoles.  No gallops or murmers. Abdomen:  Soft, flat, nondistended. No organomegaly or mass. Bowel sounds are normally active. No tenderness, guarding, or  rebound. Skin: Clear, warm, and dry.  Good turgor.  Brisk capillary refill.  Labs:   Results for orders placed during the hospital encounter of 12/05/12  CBC WITH DIFFERENTIAL      Result Value Range   WBC 4.3  4.0 - 10.5 K/uL   RBC 4.41  3.87 - 5.11 MIL/uL   Hemoglobin 13.6  12.0 - 15.0 g/dL   HCT 45.4  09.8 - 11.9 %   MCV 90.5  78.0 - 100.0 fL   MCH 30.8  26.0 - 34.0 pg   MCHC 34.1  30.0 - 36.0 g/dL   RDW 14.7  82.9 - 56.2 %   Platelets 151  150 - 400 K/uL   Neutrophils Relative % 67  43 - 77 %   Neutro Abs 2.9  1.7 - 7.7 K/uL   Lymphocytes Relative 18  12 - 46 %   Lymphs Abs 0.8  0.7 - 4.0 K/uL   Monocytes Relative 15 (*) 3 - 12 %   Monocytes Absolute 0.7  0.1 - 1.0 K/uL   Eosinophils Relative 0  0 - 5 %   Eosinophils Absolute 0.0  0.0 - 0.7 K/uL   Basophils Relative 0  0 - 1 %   Basophils Absolute 0.0  0.0 - 0.1 K/uL  POCT I-STAT, CHEM 8      Result Value Range   Sodium 134 (*) 135 - 145 mEq/L   Potassium 3.1 (*) 3.5 - 5.1 mEq/L   Chloride 93 (*) 96 - 112 mEq/L   BUN 10  6 - 23 mg/dL  Creatinine, Ser 1.00  0.50 - 1.10 mg/dL   Glucose, Bld 191 (*) 70 - 99 mg/dL   Calcium, Ion 4.78  2.95 - 1.30 mmol/L   TCO2 24  0 - 100 mmol/L   Hemoglobin 15.0  12.0 - 15.0 g/dL   HCT 62.1  30.8 - 65.7 %     Course in Urgent Care Center:   Given Zofran ODT 8 mg sublingually.  Assessment:  The primary encounter diagnosis was Gastroenteritis. A diagnosis of Hypokalemia was also pertinent to this visit.  Will need to treat her hypokalemia with potassium replacement. She was given Zofran since this did seem to help and ranitidine. Suggested a light, soft diet. Return again if no better in 3-4 days.  Plan:   1.  Meds:  The following meds were prescribed:   Discharge Medication List as of 12/05/2012 10:51 AM    START taking these medications   Details  ondansetron (ZOFRAN ODT) 8 MG disintegrating tablet Take 1 tablet (8 mg total) by mouth every 8 (eight) hours as needed for nausea.,  Starting 12/05/2012, Until Discontinued, Normal    potassium chloride (KLOR-CON 10) 10 MEQ tablet Take 1 tablet (10 mEq total) by mouth daily., Starting 12/05/2012, Until Discontinued, Normal    ranitidine (ZANTAC) 150 MG tablet Take 1 tablet (150 mg total) by mouth 2 (two) times daily., Starting 12/05/2012, Until Discontinued, Normal        2.  Patient Education/Counseling:  The patient was given appropriate handouts, self care instructions, and instructed in symptomatic relief. The patient was told to stay on clear liquids for the remainder of the day, then advance to a B.R.A.T. diet starting tomorrow.  3.  Follow up:  The patient was told to follow up if no better in 3 to 4 days, if becoming worse in any way, and given some red flag symptoms such as fever, abdominal pain, or any evidence of GI bleeding which would prompt immediate return.  Follow up here as necessary.       Reuben Likes, MD 12/05/12 2223

## 2012-12-10 ENCOUNTER — Ambulatory Visit (INDEPENDENT_AMBULATORY_CARE_PROVIDER_SITE_OTHER): Payer: Medicare Other | Admitting: *Deleted

## 2012-12-10 DIAGNOSIS — I4891 Unspecified atrial fibrillation: Secondary | ICD-10-CM

## 2012-12-10 DIAGNOSIS — Z7901 Long term (current) use of anticoagulants: Secondary | ICD-10-CM | POA: Diagnosis not present

## 2012-12-10 LAB — PROTIME-INR: Prothrombin Time: 46.1 seconds — ABNORMAL HIGH (ref 11.6–15.2)

## 2012-12-17 ENCOUNTER — Ambulatory Visit (INDEPENDENT_AMBULATORY_CARE_PROVIDER_SITE_OTHER): Payer: Medicare Other | Admitting: *Deleted

## 2012-12-17 DIAGNOSIS — Z7901 Long term (current) use of anticoagulants: Secondary | ICD-10-CM | POA: Diagnosis not present

## 2012-12-17 DIAGNOSIS — I4891 Unspecified atrial fibrillation: Secondary | ICD-10-CM | POA: Diagnosis not present

## 2012-12-17 LAB — POCT INR: INR: 3.8

## 2012-12-27 ENCOUNTER — Other Ambulatory Visit: Payer: Self-pay | Admitting: Cardiology

## 2012-12-29 ENCOUNTER — Ambulatory Visit (INDEPENDENT_AMBULATORY_CARE_PROVIDER_SITE_OTHER): Payer: Medicare Other | Admitting: *Deleted

## 2012-12-29 DIAGNOSIS — Z7901 Long term (current) use of anticoagulants: Secondary | ICD-10-CM

## 2012-12-29 DIAGNOSIS — I4891 Unspecified atrial fibrillation: Secondary | ICD-10-CM | POA: Diagnosis not present

## 2012-12-29 LAB — POCT INR: INR: 3.1

## 2013-01-15 ENCOUNTER — Ambulatory Visit (INDEPENDENT_AMBULATORY_CARE_PROVIDER_SITE_OTHER): Payer: Medicare Other | Admitting: *Deleted

## 2013-01-15 DIAGNOSIS — Z7901 Long term (current) use of anticoagulants: Secondary | ICD-10-CM

## 2013-01-15 DIAGNOSIS — I4891 Unspecified atrial fibrillation: Secondary | ICD-10-CM | POA: Diagnosis not present

## 2013-01-15 LAB — POCT INR: INR: 1.8

## 2013-01-18 ENCOUNTER — Other Ambulatory Visit: Payer: Self-pay | Admitting: Cardiology

## 2013-02-05 ENCOUNTER — Ambulatory Visit (INDEPENDENT_AMBULATORY_CARE_PROVIDER_SITE_OTHER): Payer: Medicare Other | Admitting: *Deleted

## 2013-02-05 DIAGNOSIS — I4891 Unspecified atrial fibrillation: Secondary | ICD-10-CM | POA: Diagnosis not present

## 2013-02-05 DIAGNOSIS — Z5181 Encounter for therapeutic drug level monitoring: Secondary | ICD-10-CM | POA: Diagnosis not present

## 2013-02-05 DIAGNOSIS — Z7901 Long term (current) use of anticoagulants: Secondary | ICD-10-CM

## 2013-02-05 LAB — POCT INR: INR: 2.6

## 2013-02-10 DIAGNOSIS — H6120 Impacted cerumen, unspecified ear: Secondary | ICD-10-CM | POA: Diagnosis not present

## 2013-02-10 DIAGNOSIS — G4700 Insomnia, unspecified: Secondary | ICD-10-CM | POA: Diagnosis not present

## 2013-03-16 DIAGNOSIS — R7301 Impaired fasting glucose: Secondary | ICD-10-CM | POA: Diagnosis not present

## 2013-03-16 DIAGNOSIS — I10 Essential (primary) hypertension: Secondary | ICD-10-CM | POA: Diagnosis not present

## 2013-03-16 DIAGNOSIS — I4891 Unspecified atrial fibrillation: Secondary | ICD-10-CM | POA: Diagnosis not present

## 2013-03-18 DIAGNOSIS — I4891 Unspecified atrial fibrillation: Secondary | ICD-10-CM | POA: Diagnosis not present

## 2013-03-18 DIAGNOSIS — R7309 Other abnormal glucose: Secondary | ICD-10-CM | POA: Diagnosis not present

## 2013-03-18 DIAGNOSIS — I10 Essential (primary) hypertension: Secondary | ICD-10-CM | POA: Diagnosis not present

## 2013-03-18 DIAGNOSIS — E782 Mixed hyperlipidemia: Secondary | ICD-10-CM | POA: Diagnosis not present

## 2013-03-19 ENCOUNTER — Ambulatory Visit (INDEPENDENT_AMBULATORY_CARE_PROVIDER_SITE_OTHER): Payer: Medicare Other | Admitting: *Deleted

## 2013-03-19 DIAGNOSIS — I4891 Unspecified atrial fibrillation: Secondary | ICD-10-CM | POA: Diagnosis not present

## 2013-03-19 DIAGNOSIS — Z5181 Encounter for therapeutic drug level monitoring: Secondary | ICD-10-CM | POA: Diagnosis not present

## 2013-03-19 LAB — POCT INR: INR: 1.3

## 2013-03-20 ENCOUNTER — Other Ambulatory Visit: Payer: Self-pay | Admitting: Cardiology

## 2013-03-20 MED ORDER — WARFARIN SODIUM 5 MG PO TABS: ORAL_TABLET | ORAL | Status: DC

## 2013-03-26 ENCOUNTER — Ambulatory Visit (INDEPENDENT_AMBULATORY_CARE_PROVIDER_SITE_OTHER): Payer: Medicare Other | Admitting: *Deleted

## 2013-03-26 DIAGNOSIS — I4891 Unspecified atrial fibrillation: Secondary | ICD-10-CM

## 2013-03-26 DIAGNOSIS — Z5181 Encounter for therapeutic drug level monitoring: Secondary | ICD-10-CM

## 2013-03-26 LAB — POCT INR: INR: 2.5

## 2013-04-08 ENCOUNTER — Ambulatory Visit (INDEPENDENT_AMBULATORY_CARE_PROVIDER_SITE_OTHER): Payer: Medicare Other | Admitting: *Deleted

## 2013-04-08 DIAGNOSIS — Z5181 Encounter for therapeutic drug level monitoring: Secondary | ICD-10-CM | POA: Diagnosis not present

## 2013-04-08 DIAGNOSIS — I4891 Unspecified atrial fibrillation: Secondary | ICD-10-CM | POA: Diagnosis not present

## 2013-04-08 LAB — POCT INR: INR: 2.8

## 2013-05-07 ENCOUNTER — Ambulatory Visit (INDEPENDENT_AMBULATORY_CARE_PROVIDER_SITE_OTHER): Payer: Medicare Other | Admitting: *Deleted

## 2013-05-07 DIAGNOSIS — Z5181 Encounter for therapeutic drug level monitoring: Secondary | ICD-10-CM

## 2013-05-07 DIAGNOSIS — I4891 Unspecified atrial fibrillation: Secondary | ICD-10-CM

## 2013-05-07 LAB — POCT INR: INR: 2.9

## 2013-06-04 ENCOUNTER — Ambulatory Visit (INDEPENDENT_AMBULATORY_CARE_PROVIDER_SITE_OTHER): Payer: Medicare Other | Admitting: *Deleted

## 2013-06-04 DIAGNOSIS — Z5181 Encounter for therapeutic drug level monitoring: Secondary | ICD-10-CM | POA: Diagnosis not present

## 2013-06-04 DIAGNOSIS — I4891 Unspecified atrial fibrillation: Secondary | ICD-10-CM

## 2013-06-04 LAB — POCT INR: INR: 2.7

## 2013-06-17 ENCOUNTER — Telehealth: Payer: Self-pay | Admitting: Cardiovascular Disease

## 2013-06-17 MED ORDER — ATORVASTATIN CALCIUM 40 MG PO TABS: ORAL_TABLET | ORAL | Status: DC

## 2013-06-17 NOTE — Telephone Encounter (Signed)
Medication sent via escribe.  

## 2013-06-17 NOTE — Telephone Encounter (Signed)
Received fax refill request  Rx # [id number] Medication:  Atorvastatin 40 mg tablet Qty 30 Sig:  Take one tablet by mouth every night at bedtime Physician:  Bronson Ing

## 2013-07-16 ENCOUNTER — Ambulatory Visit (INDEPENDENT_AMBULATORY_CARE_PROVIDER_SITE_OTHER): Payer: Medicare Other | Admitting: *Deleted

## 2013-07-16 ENCOUNTER — Telehealth: Payer: Self-pay | Admitting: Cardiovascular Disease

## 2013-07-16 DIAGNOSIS — Z5181 Encounter for therapeutic drug level monitoring: Secondary | ICD-10-CM | POA: Diagnosis not present

## 2013-07-16 DIAGNOSIS — I4891 Unspecified atrial fibrillation: Secondary | ICD-10-CM

## 2013-07-16 LAB — POCT INR: INR: 3.3

## 2013-07-16 MED ORDER — METOPROLOL SUCCINATE ER 25 MG PO TB24: 25.0000 mg | ORAL_TABLET | Freq: Every day | ORAL | Status: DC

## 2013-07-16 NOTE — Telephone Encounter (Signed)
Refill request complete 

## 2013-07-16 NOTE — Telephone Encounter (Signed)
Received fax refill request  Rx # [id number] Medication:  Metoprolol ER Succinate 25 mg tabs Qty 30 Sig:  Take one tablet by mouth every day Physician:  Bronson Ing

## 2013-08-06 ENCOUNTER — Encounter: Payer: Self-pay | Admitting: *Deleted

## 2013-08-06 ENCOUNTER — Encounter (INDEPENDENT_AMBULATORY_CARE_PROVIDER_SITE_OTHER): Payer: Medicare Other

## 2013-08-06 DIAGNOSIS — Z5181 Encounter for therapeutic drug level monitoring: Secondary | ICD-10-CM

## 2013-08-06 DIAGNOSIS — I4891 Unspecified atrial fibrillation: Secondary | ICD-10-CM

## 2013-08-13 ENCOUNTER — Ambulatory Visit (INDEPENDENT_AMBULATORY_CARE_PROVIDER_SITE_OTHER): Payer: Medicare Other | Admitting: *Deleted

## 2013-08-13 DIAGNOSIS — I4891 Unspecified atrial fibrillation: Secondary | ICD-10-CM

## 2013-08-13 DIAGNOSIS — Z5181 Encounter for therapeutic drug level monitoring: Secondary | ICD-10-CM | POA: Diagnosis not present

## 2013-08-13 LAB — POCT INR: INR: 2.7

## 2013-08-14 ENCOUNTER — Telehealth: Payer: Self-pay | Admitting: *Deleted

## 2013-08-14 ENCOUNTER — Telehealth: Payer: Self-pay | Admitting: Cardiovascular Disease

## 2013-08-14 MED ORDER — LISINOPRIL 20 MG PO TABS: 20.0000 mg | ORAL_TABLET | Freq: Every day | ORAL | Status: DC

## 2013-08-14 NOTE — Telephone Encounter (Signed)
Already refilled this am confirmed with pharmacy

## 2013-08-14 NOTE — Telephone Encounter (Signed)
LISINOPRIL 20 MG TO [facility]

## 2013-08-14 NOTE — Telephone Encounter (Signed)
Refill request complete 

## 2013-08-14 NOTE — Telephone Encounter (Signed)
Received fax refill request  Rx # [id number] Medication:  Lisinopril 20 mg tablets Qty 30 Sig:  Take one tablet by mouth every day Physician:  Bronson Ing

## 2013-08-18 DIAGNOSIS — H353190 Nonexudative age-related macular degeneration, unspecified eye, stage unspecified: Secondary | ICD-10-CM | POA: Diagnosis not present

## 2013-09-10 ENCOUNTER — Ambulatory Visit (INDEPENDENT_AMBULATORY_CARE_PROVIDER_SITE_OTHER): Payer: Medicare Other | Admitting: Pharmacist

## 2013-09-10 DIAGNOSIS — I4891 Unspecified atrial fibrillation: Secondary | ICD-10-CM | POA: Diagnosis not present

## 2013-09-10 DIAGNOSIS — Z5181 Encounter for therapeutic drug level monitoring: Secondary | ICD-10-CM

## 2013-09-10 LAB — POCT INR: INR: 3.3

## 2013-09-28 DIAGNOSIS — R7309 Other abnormal glucose: Secondary | ICD-10-CM | POA: Diagnosis not present

## 2013-09-28 DIAGNOSIS — E785 Hyperlipidemia, unspecified: Secondary | ICD-10-CM | POA: Diagnosis not present

## 2013-09-28 DIAGNOSIS — I10 Essential (primary) hypertension: Secondary | ICD-10-CM | POA: Diagnosis not present

## 2013-09-29 ENCOUNTER — Telehealth: Payer: Self-pay | Admitting: Cardiovascular Disease

## 2013-09-29 MED ORDER — WARFARIN SODIUM 5 MG PO TABS: ORAL_TABLET | ORAL | Status: DC

## 2013-09-29 NOTE — Telephone Encounter (Signed)
Received fax refill request  Rx # [id number] Medication:  Coumadin 5 mg tablets (peach) Qty 45 Sig:  Take 1 tablet by mouth every day or as directed by clinic Physician:  Bronson Ing

## 2013-09-30 DIAGNOSIS — M542 Cervicalgia: Secondary | ICD-10-CM | POA: Diagnosis not present

## 2013-09-30 DIAGNOSIS — Z23 Encounter for immunization: Secondary | ICD-10-CM | POA: Diagnosis not present

## 2013-09-30 DIAGNOSIS — R7309 Other abnormal glucose: Secondary | ICD-10-CM | POA: Diagnosis not present

## 2013-09-30 DIAGNOSIS — G4700 Insomnia, unspecified: Secondary | ICD-10-CM | POA: Diagnosis not present

## 2013-10-08 ENCOUNTER — Ambulatory Visit (INDEPENDENT_AMBULATORY_CARE_PROVIDER_SITE_OTHER): Payer: Medicare Other | Admitting: *Deleted

## 2013-10-08 DIAGNOSIS — I4891 Unspecified atrial fibrillation: Secondary | ICD-10-CM | POA: Diagnosis not present

## 2013-10-08 DIAGNOSIS — Z5181 Encounter for therapeutic drug level monitoring: Secondary | ICD-10-CM | POA: Diagnosis not present

## 2013-10-08 LAB — POCT INR: INR: 1.9

## 2013-11-09 ENCOUNTER — Ambulatory Visit (INDEPENDENT_AMBULATORY_CARE_PROVIDER_SITE_OTHER): Payer: Medicare Other | Admitting: *Deleted

## 2013-11-09 DIAGNOSIS — Z5181 Encounter for therapeutic drug level monitoring: Secondary | ICD-10-CM | POA: Diagnosis not present

## 2013-11-09 DIAGNOSIS — I4891 Unspecified atrial fibrillation: Secondary | ICD-10-CM | POA: Diagnosis not present

## 2013-11-09 LAB — POCT INR: INR: 2.6

## 2013-12-07 ENCOUNTER — Ambulatory Visit (INDEPENDENT_AMBULATORY_CARE_PROVIDER_SITE_OTHER): Payer: Medicare Other | Admitting: *Deleted

## 2013-12-07 DIAGNOSIS — Z5181 Encounter for therapeutic drug level monitoring: Secondary | ICD-10-CM | POA: Diagnosis not present

## 2013-12-07 DIAGNOSIS — I4891 Unspecified atrial fibrillation: Secondary | ICD-10-CM | POA: Diagnosis not present

## 2013-12-07 LAB — POCT INR: INR: 3.6

## 2013-12-17 DIAGNOSIS — L821 Other seborrheic keratosis: Secondary | ICD-10-CM | POA: Diagnosis not present

## 2013-12-17 DIAGNOSIS — D225 Melanocytic nevi of trunk: Secondary | ICD-10-CM | POA: Diagnosis not present

## 2013-12-28 ENCOUNTER — Ambulatory Visit (INDEPENDENT_AMBULATORY_CARE_PROVIDER_SITE_OTHER): Payer: Medicare Other | Admitting: *Deleted

## 2013-12-28 DIAGNOSIS — I4891 Unspecified atrial fibrillation: Secondary | ICD-10-CM | POA: Diagnosis not present

## 2013-12-28 DIAGNOSIS — Z5181 Encounter for therapeutic drug level monitoring: Secondary | ICD-10-CM

## 2013-12-28 LAB — POCT INR: INR: 2.3

## 2014-01-05 ENCOUNTER — Other Ambulatory Visit: Payer: Self-pay | Admitting: *Deleted

## 2014-01-05 NOTE — Telephone Encounter (Signed)
Error

## 2014-01-06 ENCOUNTER — Other Ambulatory Visit: Payer: Self-pay | Admitting: Cardiovascular Disease

## 2014-01-06 MED ORDER — ATORVASTATIN CALCIUM 40 MG PO TABS: ORAL_TABLET | ORAL | Status: DC

## 2014-01-06 NOTE — Telephone Encounter (Signed)
Received fax refill request  Rx # [id number] Medication:  Atorvastain 40 mg tablets Qty 30 Sig:  Take one tablet by mouth every night at bedtime Physician:  Bronson Ing

## 2014-01-25 ENCOUNTER — Ambulatory Visit (INDEPENDENT_AMBULATORY_CARE_PROVIDER_SITE_OTHER): Payer: Medicare Other | Admitting: *Deleted

## 2014-01-25 DIAGNOSIS — I4891 Unspecified atrial fibrillation: Secondary | ICD-10-CM | POA: Diagnosis not present

## 2014-01-25 DIAGNOSIS — H52222 Regular astigmatism, left eye: Secondary | ICD-10-CM | POA: Diagnosis not present

## 2014-01-25 DIAGNOSIS — Z5181 Encounter for therapeutic drug level monitoring: Secondary | ICD-10-CM

## 2014-01-25 DIAGNOSIS — H5203 Hypermetropia, bilateral: Secondary | ICD-10-CM | POA: Diagnosis not present

## 2014-01-25 DIAGNOSIS — H3530 Unspecified macular degeneration: Secondary | ICD-10-CM | POA: Diagnosis not present

## 2014-01-25 DIAGNOSIS — H524 Presbyopia: Secondary | ICD-10-CM | POA: Diagnosis not present

## 2014-01-25 LAB — POCT INR: INR: 2.8

## 2014-02-03 ENCOUNTER — Other Ambulatory Visit: Payer: Self-pay | Admitting: Cardiovascular Disease

## 2014-02-03 MED ORDER — METOPROLOL SUCCINATE ER 25 MG PO TB24: 25.0000 mg | ORAL_TABLET | Freq: Every day | ORAL | Status: DC

## 2014-02-03 NOTE — Telephone Encounter (Signed)
Medication sent to pharmacy. Pt needs appt for further refills

## 2014-02-03 NOTE — Telephone Encounter (Signed)
Received fax refill request  Rx # [id number] Medication:  Metoprolol ER Succinate 25 mg tabs Qty 30 Sig:  Take one tablet by mouth every day Physician:  Bronson Ing

## 2014-02-21 ENCOUNTER — Other Ambulatory Visit: Payer: Self-pay | Admitting: Cardiovascular Disease

## 2014-03-01 ENCOUNTER — Ambulatory Visit (INDEPENDENT_AMBULATORY_CARE_PROVIDER_SITE_OTHER): Payer: Medicare Other | Admitting: *Deleted

## 2014-03-01 DIAGNOSIS — I4891 Unspecified atrial fibrillation: Secondary | ICD-10-CM | POA: Diagnosis not present

## 2014-03-01 DIAGNOSIS — Z5181 Encounter for therapeutic drug level monitoring: Secondary | ICD-10-CM | POA: Diagnosis not present

## 2014-03-01 LAB — POCT INR: INR: 2.6

## 2014-03-01 MED ORDER — ATORVASTATIN CALCIUM 40 MG PO TABS: 40.0000 mg | ORAL_TABLET | Freq: Every day | ORAL | Status: DC

## 2014-03-02 ENCOUNTER — Encounter: Payer: Self-pay | Admitting: Cardiovascular Disease

## 2014-03-02 ENCOUNTER — Other Ambulatory Visit: Payer: Self-pay | Admitting: Cardiovascular Disease

## 2014-03-02 ENCOUNTER — Ambulatory Visit (INDEPENDENT_AMBULATORY_CARE_PROVIDER_SITE_OTHER): Payer: Medicare Other | Admitting: Cardiovascular Disease

## 2014-03-02 VITALS — BP 112/68 | HR 78 | Ht 64.0 in | Wt 129.0 lb

## 2014-03-02 DIAGNOSIS — Z7901 Long term (current) use of anticoagulants: Secondary | ICD-10-CM

## 2014-03-02 DIAGNOSIS — I482 Chronic atrial fibrillation, unspecified: Secondary | ICD-10-CM

## 2014-03-02 DIAGNOSIS — I10 Essential (primary) hypertension: Secondary | ICD-10-CM | POA: Diagnosis not present

## 2014-03-02 DIAGNOSIS — I4891 Unspecified atrial fibrillation: Secondary | ICD-10-CM | POA: Diagnosis not present

## 2014-03-02 DIAGNOSIS — E785 Hyperlipidemia, unspecified: Secondary | ICD-10-CM | POA: Diagnosis not present

## 2014-03-02 NOTE — Patient Instructions (Signed)
Your physician wants you to follow-up in: 1 year Dr Koneswaran You will receive a reminder letter in the mail two months in advance. If you don't receive a letter, please call our office to schedule the follow-up appointment.     Your physician recommends that you continue on your current medications as directed. Please refer to the Current Medication list given to you today.     Thank you for choosing [facility] !        

## 2014-03-02 NOTE — Progress Notes (Signed)
Patient ID: Renee Cameron, female   DOB: [birth year], 81 y.o.   MRN: [id number]      SUBJECTIVE: Renee Cameron has a history of chronic atrial fibrillation and essential hypertension and has been successfully maintained on metoprolol and warfarin.  She denies any symptoms of chest pain, palpitations, shortness of breath, lightheadedness, dizziness, leg swelling, orthopnea, PND, and syncope. ECG performed in the office today demonstrates rate controlled atrial fibrillation with an underlying right bundle branch block, heart rate 92 bpm, QRS duration 136 ms.  She used to go to [employer] and do water aerobics 2-3 days a week, but has not done so in some time but wants to get back into it.  She turned 71 yesterday and feels great!  Her husband passed away 8 years ago.  She has an adopted daughter who lives in the area.    Review of Systems: As per "subjective", otherwise negative.  No Known Allergies  Current Outpatient Prescriptions  Medication Sig Dispense Refill  . ALPRAZolam (XANAX) 0.5 MG tablet Take 0.5 mg by mouth at bedtime as needed.      Marland Kitchen ascorbic Acid (VITAMIN C CR) 500 MG CPCR Take 500 mg by mouth daily.      Marland Kitchen atorvastatin (LIPITOR) 40 MG tablet Take 1 tablet (40 mg total) by mouth at bedtime. 30 tablet 6  . lisinopril (PRINIVIL,ZESTRIL) 20 MG tablet Take 1 tablet (20 mg total) by mouth daily. 90 tablet 3  . metoprolol succinate (TOPROL-XL) 25 MG 24 hr tablet TAKE 1 TABLET BY MOUTH EVERY DAY 30 tablet 6  . Multiple Vitamins-Minerals (CENTRUM SILVER PO) Take 1 tablet by mouth daily.      . Omega-3 Fatty Acids (FISH OIL) 1000 MG CAPS Take 1 capsule by mouth daily.      . ondansetron (ZOFRAN ODT) 8 MG disintegrating tablet Take 1 tablet (8 mg total) by mouth every 8 (eight) hours as needed for nausea. 20 tablet 0  . potassium chloride (KLOR-CON 10) 10 MEQ tablet Take 1 tablet (10 mEq total) by mouth daily. 7 tablet 0  . ranitidine (ZANTAC) 150 MG tablet Take 1 tablet (150 mg  total) by mouth 2 (two) times daily. 14 tablet 0  . warfarin (COUMADIN) 5 MG tablet Take 1 tablet daily except 1 1/2 tablets on Mondays or as directed 45 tablet 3   No current facility-administered medications for this visit.    Past Medical History  Diagnosis Date  . Atrial fibrillation 1993    permanent; onset in 1993  . Hyperthyroidism 2000    2000  . Hypertension     unspecified  . Osteoporosis   . Chronic anticoagulation     Past Surgical History  Procedure Laterality Date  . Appendectomy    . Total abdominal hysterectomy w/ bilateral salpingoophorectomy      History   Social History  . Marital Status: Widowed    Spouse Name: N/A  . Number of Children: N/A  . Years of Education: N/A   Occupational History  . Retired     [place] Topics  . Smoking status: Never Smoker   . Smokeless tobacco: Never Used  . Alcohol Use: No  . Drug Use: No  . Sexual Activity: Not on file   Other Topics Concern  . Not on file   Social History Narrative   Volunteers 2 days weekly     Filed Vitals:   03/02/14 0841  BP: 112/68  Pulse: 78  Height: 5\' 4"  (1.626 m)  Weight: 129 lb (58.514 kg)  SpO2: 95%    PHYSICAL EXAM General: NAD Neck: No JVD, no thyromegaly or thyroid nodule.  Lungs: Clear to auscultation bilaterally with normal respiratory effort. CV: Nondisplaced PMI. Irregular rhythm, normal rate, normal S1/S2, no significant murmur. No peripheral edema.Normal pedal pulses.  Abdomen: Soft, nontender, no hepatosplenomegaly, no distention.  Neurologic: Alert and oriented x 3.  Psych: Normal affect. Skin: Normal. Musculoskeletal: Normal range of motion, no gross deformities. Extremities: No clubbing or cyanosis.   ECG: Most recent ECG reviewed.      ASSESSMENT AND PLAN: 1. Chronic atrial fibrillation: Heart rate is well controlled on current dose of metoprolol. She is asymptomatic with respect to her arrhythmia.  She is maintained on warfarin without complications. INR 2.6 on 03/01/14. 2. Essential hypertension: Well controlled on lisinopril. No changes. 3. Hyperlipidemia: Maintained on Lipitor 40 mg.  Dispo: f/u 1 year.  Kate Sable, M.D., F.A.C.C.

## 2014-03-05 DIAGNOSIS — Z6822 Body mass index (BMI) 22.0-22.9, adult: Secondary | ICD-10-CM | POA: Diagnosis not present

## 2014-03-05 DIAGNOSIS — A084 Viral intestinal infection, unspecified: Secondary | ICD-10-CM | POA: Diagnosis not present

## 2014-03-05 DIAGNOSIS — J069 Acute upper respiratory infection, unspecified: Secondary | ICD-10-CM | POA: Diagnosis not present

## 2014-03-11 ENCOUNTER — Encounter (HOSPITAL_COMMUNITY): Payer: Self-pay | Admitting: Emergency Medicine

## 2014-03-11 ENCOUNTER — Inpatient Hospital Stay (HOSPITAL_COMMUNITY)
Admission: EM | Admit: 2014-03-11 | Discharge: 2014-03-18 | DRG: 178 | Disposition: A | Discharge status: Home / Self Care | Payer: Medicare Other | Attending: Internal Medicine | Admitting: Internal Medicine

## 2014-03-11 ENCOUNTER — Emergency Department (HOSPITAL_COMMUNITY): Payer: Medicare Other

## 2014-03-11 DIAGNOSIS — M810 Age-related osteoporosis without current pathological fracture: Secondary | ICD-10-CM | POA: Diagnosis present

## 2014-03-11 DIAGNOSIS — E039 Hypothyroidism, unspecified: Secondary | ICD-10-CM | POA: Diagnosis present

## 2014-03-11 DIAGNOSIS — E860 Dehydration: Secondary | ICD-10-CM | POA: Diagnosis present

## 2014-03-11 DIAGNOSIS — S300XXA Contusion of lower back and pelvis, initial encounter: Secondary | ICD-10-CM | POA: Diagnosis present

## 2014-03-11 DIAGNOSIS — S301XXD Contusion of abdominal wall, subsequent encounter: Secondary | ICD-10-CM | POA: Diagnosis not present

## 2014-03-11 DIAGNOSIS — Z23 Encounter for immunization: Secondary | ICD-10-CM | POA: Diagnosis not present

## 2014-03-11 DIAGNOSIS — R110 Nausea: Secondary | ICD-10-CM

## 2014-03-11 DIAGNOSIS — Z90710 Acquired absence of both cervix and uterus: Secondary | ICD-10-CM

## 2014-03-11 DIAGNOSIS — X58XXXA Exposure to other specified factors, initial encounter: Secondary | ICD-10-CM | POA: Diagnosis present

## 2014-03-11 DIAGNOSIS — T45515A Adverse effect of anticoagulants, initial encounter: Secondary | ICD-10-CM | POA: Diagnosis present

## 2014-03-11 DIAGNOSIS — E871 Hypo-osmolality and hyponatremia: Secondary | ICD-10-CM

## 2014-03-11 DIAGNOSIS — J690 Pneumonitis due to inhalation of food and vomit: Principal | ICD-10-CM | POA: Diagnosis present

## 2014-03-11 DIAGNOSIS — I482 Chronic atrial fibrillation: Secondary | ICD-10-CM | POA: Diagnosis not present

## 2014-03-11 DIAGNOSIS — R112 Nausea with vomiting, unspecified: Secondary | ICD-10-CM | POA: Diagnosis not present

## 2014-03-11 DIAGNOSIS — Z8249 Family history of ischemic heart disease and other diseases of the circulatory system: Secondary | ICD-10-CM | POA: Diagnosis not present

## 2014-03-11 DIAGNOSIS — D500 Iron deficiency anemia secondary to blood loss (chronic): Secondary | ICD-10-CM | POA: Diagnosis not present

## 2014-03-11 DIAGNOSIS — Z823 Family history of stroke: Secondary | ICD-10-CM | POA: Diagnosis not present

## 2014-03-11 DIAGNOSIS — S2020XD Contusion of thorax, unspecified, subsequent encounter: Secondary | ICD-10-CM | POA: Diagnosis not present

## 2014-03-11 DIAGNOSIS — S301XXA Contusion of abdominal wall, initial encounter: Secondary | ICD-10-CM | POA: Diagnosis present

## 2014-03-11 DIAGNOSIS — R109 Unspecified abdominal pain: Secondary | ICD-10-CM

## 2014-03-11 DIAGNOSIS — R197 Diarrhea, unspecified: Secondary | ICD-10-CM | POA: Diagnosis not present

## 2014-03-11 DIAGNOSIS — I10 Essential (primary) hypertension: Secondary | ICD-10-CM | POA: Diagnosis present

## 2014-03-11 DIAGNOSIS — K529 Noninfective gastroenteritis and colitis, unspecified: Secondary | ICD-10-CM | POA: Diagnosis present

## 2014-03-11 DIAGNOSIS — J189 Pneumonia, unspecified organism: Secondary | ICD-10-CM

## 2014-03-11 DIAGNOSIS — Z7901 Long term (current) use of anticoagulants: Secondary | ICD-10-CM

## 2014-03-11 DIAGNOSIS — R05 Cough: Secondary | ICD-10-CM | POA: Diagnosis not present

## 2014-03-11 DIAGNOSIS — E0590 Thyrotoxicosis, unspecified without thyrotoxic crisis or storm: Secondary | ICD-10-CM | POA: Diagnosis present

## 2014-03-11 DIAGNOSIS — J449 Chronic obstructive pulmonary disease, unspecified: Secondary | ICD-10-CM | POA: Diagnosis not present

## 2014-03-11 DIAGNOSIS — E785 Hyperlipidemia, unspecified: Secondary | ICD-10-CM | POA: Diagnosis present

## 2014-03-11 DIAGNOSIS — S2020XA Contusion of thorax, unspecified, initial encounter: Secondary | ICD-10-CM | POA: Diagnosis not present

## 2014-03-11 DIAGNOSIS — D62 Acute posthemorrhagic anemia: Secondary | ICD-10-CM | POA: Diagnosis not present

## 2014-03-11 DIAGNOSIS — R609 Edema, unspecified: Secondary | ICD-10-CM

## 2014-03-11 DIAGNOSIS — R1110 Vomiting, unspecified: Secondary | ICD-10-CM | POA: Diagnosis present

## 2014-03-11 DIAGNOSIS — I4821 Permanent atrial fibrillation: Secondary | ICD-10-CM | POA: Diagnosis present

## 2014-03-11 DIAGNOSIS — I808 Phlebitis and thrombophlebitis of other sites: Secondary | ICD-10-CM | POA: Diagnosis not present

## 2014-03-11 LAB — CBC WITH DIFFERENTIAL/PLATELET
BASOS ABS: 0 10*3/uL (ref 0.0–0.1)
BASOS PCT: 0 % (ref 0–1)
EOS ABS: 0 10*3/uL (ref 0.0–0.7)
Eosinophils Relative: 0 % (ref 0–5)
HEMATOCRIT: 33 % — AB (ref 36.0–46.0)
Hemoglobin: 11.1 g/dL — ABNORMAL LOW (ref 12.0–15.0)
Lymphocytes Relative: 7 % — ABNORMAL LOW (ref 12–46)
Lymphs Abs: 1 10*3/uL (ref 0.7–4.0)
MCH: 30.4 pg (ref 26.0–34.0)
MCHC: 33.6 g/dL (ref 30.0–36.0)
MCV: 90.4 fL (ref 78.0–100.0)
MONO ABS: 1.4 10*3/uL — AB (ref 0.1–1.0)
Monocytes Relative: 10 % (ref 3–12)
Neutro Abs: 11.3 10*3/uL — ABNORMAL HIGH (ref 1.7–7.7)
Neutrophils Relative %: 83 % — ABNORMAL HIGH (ref 43–77)
Platelets: 289 10*3/uL (ref 150–400)
RBC: 3.65 MIL/uL — ABNORMAL LOW (ref 3.87–5.11)
RDW: 13.1 % (ref 11.5–15.5)
WBC: 13.6 10*3/uL — ABNORMAL HIGH (ref 4.0–10.5)

## 2014-03-11 LAB — I-STAT CHEM 8, ED
BUN: 9 mg/dL (ref 6–23)
CHLORIDE: 90 mmol/L — AB (ref 96–112)
Calcium, Ion: 1.07 mmol/L — ABNORMAL LOW (ref 1.13–1.30)
Creatinine, Ser: 0.8 mg/dL (ref 0.50–1.10)
GLUCOSE: 151 mg/dL — AB (ref 70–99)
HCT: 37 % (ref 36.0–46.0)
HEMOGLOBIN: 12.6 g/dL (ref 12.0–15.0)
POTASSIUM: 3.6 mmol/L (ref 3.5–5.1)
Sodium: 129 mmol/L — ABNORMAL LOW (ref 135–145)
TCO2: 26 mmol/L (ref 0–100)

## 2014-03-11 LAB — PROTIME-INR
INR: 9.12 (ref 0.00–1.49)
INR: 6.67 (ref 0.00–1.49)
PROTHROMBIN TIME: 74.8 s — AB (ref 11.6–15.2)
Prothrombin Time: 58.6 seconds — ABNORMAL HIGH (ref 11.6–15.2)

## 2014-03-11 LAB — STREP PNEUMONIAE URINARY ANTIGEN: Strep Pneumo Urinary Antigen: NEGATIVE

## 2014-03-11 MED ORDER — DEXTROSE 5 % IV SOLN
500.0000 mg | INTRAVENOUS | Status: DC
  Administered 2014-03-12: 500 mg via INTRAVENOUS
  Filled 2014-03-11 (×4): qty 500

## 2014-03-11 MED ORDER — DEXTROSE 5 % IV SOLN: 500.0000 mg | INTRAVENOUS | Status: DC

## 2014-03-11 MED ORDER — WARFARIN - PHARMACIST DOSING INPATIENT: Status: DC

## 2014-03-11 MED ORDER — ALBUTEROL SULFATE (2.5 MG/3ML) 0.083% IN NEBU: 5.0000 mg | INHALATION_SOLUTION | Freq: Once | RESPIRATORY_TRACT | Status: DC

## 2014-03-11 MED ORDER — SODIUM CHLORIDE 0.9 % IV SOLN: INTRAVENOUS | Status: DC

## 2014-03-11 MED ORDER — AZITHROMYCIN 500 MG IV SOLR
500.0000 mg | INTRAVENOUS | Status: DC
  Administered 2014-03-11: 500 mg via INTRAVENOUS
  Filled 2014-03-11: qty 500

## 2014-03-11 MED ORDER — AEROCHAMBER Z-STAT PLUS/MEDIUM MISC: 1.0000 | Freq: Once | Status: DC

## 2014-03-11 MED ORDER — SODIUM CHLORIDE 0.9 % IV BOLUS (SEPSIS)
1000.0000 mL | Freq: Once | INTRAVENOUS | Status: AC
  Administered 2014-03-11: 1000 mL via INTRAVENOUS

## 2014-03-11 MED ORDER — ALBUTEROL SULFATE HFA 108 (90 BASE) MCG/ACT IN AERS
2.0000 | INHALATION_SPRAY | RESPIRATORY_TRACT | Status: DC | PRN
  Administered 2014-03-11: 2 via RESPIRATORY_TRACT
  Filled 2014-03-11: qty 6.7

## 2014-03-11 MED ORDER — ATORVASTATIN CALCIUM 40 MG PO TABS
40.0000 mg | ORAL_TABLET | Freq: Every day | ORAL | Status: DC
  Administered 2014-03-11 – 2014-03-17 (×7): 40 mg via ORAL
  Filled 2014-03-11 (×7): qty 1

## 2014-03-11 MED ORDER — PHYTONADIONE 5 MG PO TABS
2.5000 mg | ORAL_TABLET | Freq: Once | ORAL | Status: AC
  Administered 2014-03-11: 2.5 mg via ORAL
  Filled 2014-03-11: qty 1

## 2014-03-11 MED ORDER — CEFTRIAXONE SODIUM 1 G IJ SOLR
1.0000 g | Freq: Once | INTRAMUSCULAR | Status: AC
  Administered 2014-03-11: 1 g via INTRAMUSCULAR
  Filled 2014-03-11: qty 10

## 2014-03-11 MED ORDER — IPRATROPIUM-ALBUTEROL 0.5-2.5 (3) MG/3ML IN SOLN
3.0000 mL | Freq: Once | RESPIRATORY_TRACT | Status: AC
  Administered 2014-03-11: 3 mL via RESPIRATORY_TRACT
  Filled 2014-03-11: qty 3

## 2014-03-11 MED ORDER — METOPROLOL SUCCINATE ER 25 MG PO TB24
25.0000 mg | ORAL_TABLET | Freq: Every day | ORAL | Status: DC
  Administered 2014-03-11 – 2014-03-15 (×5): 25 mg via ORAL
  Filled 2014-03-11 (×5): qty 1

## 2014-03-11 MED ORDER — SODIUM CHLORIDE 0.9 % IV SOLN
INTRAVENOUS | Status: DC
  Administered 2014-03-11: 06:00:00 via INTRAVENOUS

## 2014-03-11 MED ORDER — IPRATROPIUM BROMIDE 0.02 % IN SOLN: 0.5000 mg | Freq: Once | RESPIRATORY_TRACT | Status: DC

## 2014-03-11 MED ORDER — SODIUM CHLORIDE 0.9 % IV SOLN
1.5000 g | Freq: Four times a day (QID) | INTRAVENOUS | Status: DC
  Administered 2014-03-11 – 2014-03-13 (×9): 1.5 g via INTRAVENOUS
  Filled 2014-03-11 (×17): qty 1.5

## 2014-03-11 MED ORDER — PNEUMOCOCCAL VAC POLYVALENT 25 MCG/0.5ML IJ INJ
0.5000 mL | INJECTION | INTRAMUSCULAR | Status: AC
  Administered 2014-03-12: 0.5 mL via INTRAMUSCULAR
  Filled 2014-03-11: qty 0.5

## 2014-03-11 MED ORDER — SODIUM CHLORIDE 0.9 % IV SOLN
INTRAVENOUS | Status: DC
  Administered 2014-03-11 – 2014-03-12 (×2): via INTRAVENOUS

## 2014-03-11 MED ORDER — HYDROCOD POLST-CHLORPHEN POLST 10-8 MG/5ML PO LQCR
5.0000 mL | Freq: Once | ORAL | Status: AC
  Administered 2014-03-12: 5 mL via ORAL
  Filled 2014-03-11 (×2): qty 5

## 2014-03-11 MED ORDER — ALBUTEROL SULFATE (2.5 MG/3ML) 0.083% IN NEBU: 2.5000 mg | INHALATION_SOLUTION | RESPIRATORY_TRACT | Status: DC | PRN

## 2014-03-11 MED ORDER — AZITHROMYCIN 250 MG PO TABS
500.0000 mg | ORAL_TABLET | Freq: Once | ORAL | Status: AC
  Administered 2014-03-11: 500 mg via ORAL
  Filled 2014-03-11: qty 2

## 2014-03-11 MED ORDER — ONDANSETRON 4 MG PO TBDP
4.0000 mg | ORAL_TABLET | Freq: Once | ORAL | Status: AC
  Administered 2014-03-11: 4 mg via ORAL
  Filled 2014-03-11: qty 1

## 2014-03-11 MED ORDER — LIDOCAINE HCL (PF) 1 % IJ SOLN
INTRAMUSCULAR | Status: AC
  Filled 2014-03-11: qty 5

## 2014-03-11 MED ORDER — ALBUTEROL SULFATE (2.5 MG/3ML) 0.083% IN NEBU
2.5000 mg | INHALATION_SOLUTION | Freq: Once | RESPIRATORY_TRACT | Status: AC
  Administered 2014-03-11: 2.5 mg via RESPIRATORY_TRACT
  Filled 2014-03-11: qty 3

## 2014-03-11 MED ORDER — CEFTRIAXONE SODIUM IN DEXTROSE 20 MG/ML IV SOLN: 1.0000 g | INTRAVENOUS | Status: DC

## 2014-03-11 NOTE — Progress Notes (Signed)
Inhaler with spacer ordered for patient, when i walked into the room patient was throwing up, i explained to patient how to perform the inhaler with the spacer. Patient understood.

## 2014-03-11 NOTE — ED Notes (Signed)
Pt vomited up antibiotic, edp notified and order received.

## 2014-03-11 NOTE — Progress Notes (Signed)
CRITICAL VALUE ALERT  Critical value received: PT INR  Date of notification:  03/11/2014  Time of notification:  1221  Critical value read back: Yes  Nurse who received alert:  Anna Genre  MD notified (1st page):  Dr. Candiss Norse  Time of first page:  1228  MD notified (2nd page):  Time of second page:  Responding MD:  Dr. Candiss Norse  Time MD responded:  [phone]

## 2014-03-11 NOTE — ED Notes (Signed)
Pt oxygen level stayed at 97% on room air while ambulating around the nurses station.

## 2014-03-11 NOTE — Care Management Note (Addendum)
    Page 1 of 1   03/18/2014     11:18:27 AM CARE MANAGEMENT NOTE 03/18/2014  Patient:  TRENELL, MOXEY   Account Number:  [id number]  Date Initiated:  03/11/2014  Documentation initiated by:  CHILDRESS,JESSICA  Subjective/Objective Assessment:   Pt from home, lives alone and independnet at baseline. Pt has no DME's or HH services prior to admission. Pt still drives and has strong family support.     Action/Plan:   Pt plans to discharge home with self care. No CM needs.   Anticipated DC Date:  03/12/2014   Anticipated DC Plan:  [place]  CM consult      Choice offered to / List presented to:             Status of service:  Completed, signed off Medicare Important Message given?  YES (If response is "NO", the following Medicare IM given date fields will be blank) Date Medicare IM given:  03/12/2014 Medicare IM given by:  Jolene Provost Date Additional Medicare IM given:  03/18/2014 Additional Medicare IM given by:  JESSICA CHILDRESS  Discharge Disposition:  HOME/SELF CARE  Per UR Regulation:  Reviewed for med. necessity/level of care/duration of stay  If discussed at [place] of Stay Meetings, dates discussed:   03/16/2014  03/18/2014    Comments:  03/18/2014 [place], RN, MSN, CM Pt still feels like she has no HH needs. She says her daughter will check in on her and she feels comfortable going home by herself. Pt has ambulated in hall with RN. 03/16/2014 [place], RN, MSN, CM 03/12/2014 [place], RN, MSN, CM Discharge anticipated over weekend. No CM needs identified. 03/11/2014 [place], RN, MSN, CM

## 2014-03-11 NOTE — Clinical Documentation Improvement (Signed)
  81 year old female admitted with right middle lobe pneumonia.  Patient has also had nausea and vomiting.  "Aspiration" documented in progress note 03/11/14.  Can the patient's pneumonia be further clarified as:    - Aspiration Pneumonia    - CAP, unable to further clarify   - Other type of PNA   Thank You, Erling Conte ,RN Clinical Documentation Specialist:  [facility]

## 2014-03-11 NOTE — Progress Notes (Signed)
CRITICAL VALUE ALERT  Critical value received:  INR 6.67  Date of notification:  03/11/14  Time of notification:  2039  Critical value read back:Yes.    Nurse who received alert:  Donnella Bi, RN  MD notified (1st page):  Raliegh Ip Schorr  Time of first page:  2241  Responding MD:  Lamar Blinks   Note: MD advised RN to monitor patient for now and that no aggressive treatment will be given at this time. Patient is stable.

## 2014-03-11 NOTE — ED Provider Notes (Signed)
CSN: [id number]     Arrival date & time 03/11/14  0100 History   First MD Initiated Contact with Patient 03/11/14 [phone]     Chief Complaint  Patient presents with  . Nausea  . Cough     (Consider location/radiation/quality/duration/timing/severity/associated sxs/prior Treatment) HPI  Patient reports she started feeling bad about a week ago. She started having coughing and sometimes coughs up some brown mucus. She has had no fever or chills. She has had nausea which got worse tonight. She has not had vomiting or diarrhea. She denies sore throat, rhinorrhea, shortness breath, or wheezing. She states her abdomen is sore from coughing. She has had a decreased appetite because of the nausea. She reports she did have a flu shot this fall. She denies being around anybody else who is ill.  PCP Dr Merlyn Albert  Past Medical History  Diagnosis Date  . Atrial fibrillation 1993    permanent; onset in 1993  . Hyperthyroidism 2000    2000  . Hypertension     unspecified  . Osteoporosis   . Chronic anticoagulation    Past Surgical History  Procedure Laterality Date  . Appendectomy    . Total abdominal hysterectomy w/ bilateral salpingoophorectomy     Family History  Problem Relation Age of Onset  . Prostate cancer Father   . Stroke Father   . Heart disease Mother   . Coronary artery disease Sister     with prior CABG surgery   History  Substance Use Topics  . Smoking status: Never Smoker   . Smokeless tobacco: Never Used  . Alcohol Use: No   Widowed Lives alone No second hand smoke  OB History    No data available     Review of Systems  All other systems reviewed and are negative.     Allergies  Review of patient's allergies indicates no known allergies.  Home Medications   Prior to Admission medications   Medication Sig Start Date End Date Taking? Authorizing Provider  ALPRAZolam Duanne Moron) 0.5 MG tablet Take 0.5 mg by mouth at bedtime as needed.      Historical Provider, MD   ascorbic Acid (VITAMIN C CR) 500 MG CPCR Take 500 mg by mouth daily.      Historical Provider, MD  atorvastatin (LIPITOR) 40 MG tablet Take 1 tablet (40 mg total) by mouth at bedtime. 03/01/14   Herminio Commons, MD  lisinopril (PRINIVIL,ZESTRIL) 20 MG tablet Take 1 tablet (20 mg total) by mouth daily. 08/14/13   Herminio Commons, MD  metoprolol succinate (TOPROL-XL) 25 MG 24 hr tablet TAKE 1 TABLET BY MOUTH EVERY DAY 03/02/14   Herminio Commons, MD  Multiple Vitamins-Minerals (CENTRUM SILVER PO) Take 1 tablet by mouth daily.      Historical Provider, MD  Omega-3 Fatty Acids (FISH OIL) 1000 MG CAPS Take 1 capsule by mouth daily.      Historical Provider, MD  ondansetron (ZOFRAN ODT) 8 MG disintegrating tablet Take 1 tablet (8 mg total) by mouth every 8 (eight) hours as needed for nausea. 12/05/12   Harden Mo, MD  potassium chloride (KLOR-CON 10) 10 MEQ tablet Take 1 tablet (10 mEq total) by mouth daily. 12/05/12   Harden Mo, MD  ranitidine (ZANTAC) 150 MG tablet Take 1 tablet (150 mg total) by mouth 2 (two) times daily. 12/05/12   Harden Mo, MD  warfarin (COUMADIN) 5 MG tablet Take 1 tablet daily except 1 1/2 tablets on Mondays or  as directed 09/29/13   Herminio Commons, MD   BP 118/53 mmHg  Pulse 83  Temp(Src) 98.4 F (36.9 C) (Oral)  Resp 22  Ht 5\' 5"  (1.651 m)  Wt 124 lb (56.246 kg)  BMI 20.63 kg/m2  SpO2 97%  Vital signs normal   Physical Exam  Constitutional: She is oriented to person, place, and time. She appears well-developed and well-nourished.  Non-toxic appearance. She does not appear ill. No distress.  HENT:  Head: Normocephalic and atraumatic.  Right Ear: External ear normal.  Left Ear: External ear normal.  Nose: Nose normal. No mucosal edema or rhinorrhea.  Mouth/Throat: Oropharynx is clear and moist and mucous membranes are normal. No dental abscesses or uvula swelling.  Eyes: Conjunctivae and EOM are normal. Pupils are equal, round, and reactive  to light.  Neck: Normal range of motion and full passive range of motion without pain. Neck supple.  Cardiovascular: Normal rate, regular rhythm and normal heart sounds.  Exam reveals no gallop and no friction rub.   No murmur heard. Pulmonary/Chest: Effort normal. No respiratory distress. She has decreased breath sounds. She has wheezes. She has no rhonchi. She has no rales. She exhibits no tenderness and no crepitus.  Pt had a few wheezes then coughed and her lungs were clear  Abdominal: Soft. Normal appearance and bowel sounds are normal. She exhibits no distension. There is no tenderness. There is no rebound and no guarding.  Musculoskeletal: Normal range of motion. She exhibits no edema or tenderness.  Moves all extremities well.   Neurological: She is alert and oriented to person, place, and time. She has normal strength. No cranial nerve deficit.  Skin: Skin is warm, dry and intact. No rash noted. No erythema. No pallor.  Psychiatric: She has a normal mood and affect. Her speech is normal and behavior is normal. Her mood appears not anxious.  Nursing note and vitals reviewed.   ED Course  Procedures (including critical care time)  Medications  albuterol (PROVENTIL HFA;VENTOLIN HFA) 108 (90 BASE) MCG/ACT inhaler 2 puff (2 puffs Inhalation Given 03/11/14 0305)  aerochamber Z-Stat Plus/medium 1 each (not administered)  lidocaine (PF) (XYLOCAINE) 1 % injection (not administered)  azithromycin (ZITHROMAX) 500 mg in dextrose 5 % 250 mL IVPB (0 mg Intravenous Stopped 03/11/14 0436)  ondansetron (ZOFRAN-ODT) disintegrating tablet 4 mg (4 mg Oral Given 03/11/14 0153)  ipratropium-albuterol (DUONEB) 0.5-2.5 (3) MG/3ML nebulizer solution 3 mL (3 mLs Nebulization Given 03/11/14 0209)  albuterol (PROVENTIL) (2.5 MG/3ML) 0.083% nebulizer solution 2.5 mg (2.5 mg Nebulization Given 03/11/14 0209)  cefTRIAXone (ROCEPHIN) injection 1 g (1 g Intramuscular Given 03/11/14 0256)  azithromycin (ZITHROMAX) tablet 500  mg (500 mg Oral Given 03/11/14 0302)  sodium chloride 0.9 % bolus 1,000 mL (0 mLs Intravenous Stopped 03/11/14 0436)    Patient was given albuterol nebulizer treatment.  Patient was rechecked at 0240. She states she may be a little improved. When I re-listened to her she again had some wheeze and immediately coughed and then her lungs were clear. Her air movement was much improved. Discussed her chest x-ray results. She has been afebrile and is  not tachycardic or hypoxic. She was given Rocephin IM and Zithromax orally was ordered with the anticipation she would be discharged home.  Patient was ambulated by nursing staff who report her pulse ox remained 97% on room air and that she ambulated without difficulty.  Shortly after patient took the Zithromax she did have an episode of vomiting. I talked to  the patient and we decided to put in an IV and give her some IV fluids and IV azithromycin. Lab work was ordered.  Pt states not feeling any better.  With her nausea and vomiting while in the ED with dehydration will talk to hospitalist about admission.   04:46 Dr Shanon Brow, admit to observation, med-surg bed.    Labs Review Results for orders placed or performed during the hospital encounter of 03/11/14  CBC with Differential  Result Value Ref Range   WBC 13.6 (H) 4.0 - 10.5 K/uL   RBC 3.65 (L) 3.87 - 5.11 MIL/uL   Hemoglobin 11.1 (L) 12.0 - 15.0 g/dL   HCT 33.0 (L) 36.0 - 46.0 %   MCV 90.4 78.0 - 100.0 fL   MCH 30.4 26.0 - 34.0 pg   MCHC 33.6 30.0 - 36.0 g/dL   RDW 13.1 11.5 - 15.5 %   Platelets 289 150 - 400 K/uL   Neutrophils Relative % 83 (H) 43 - 77 %   Neutro Abs 11.3 (H) 1.7 - 7.7 K/uL   Lymphocytes Relative 7 (L) 12 - 46 %   Lymphs Abs 1.0 0.7 - 4.0 K/uL   Monocytes Relative 10 3 - 12 %   Monocytes Absolute 1.4 (H) 0.1 - 1.0 K/uL   Eosinophils Relative 0 0 - 5 %   Eosinophils Absolute 0.0 0.0 - 0.7 K/uL   Basophils Relative 0 0 - 1 %   Basophils Absolute 0.0 0.0 - 0.1 K/uL   WBC  Morphology ATYPICAL LYMPHOCYTES   I-stat Chem 8, ED  Result Value Ref Range   Sodium 129 (L) 135 - 145 mmol/L   Potassium 3.6 3.5 - 5.1 mmol/L   Chloride 90 (L) 96 - 112 mmol/L   BUN 9 6 - 23 mg/dL   Creatinine, Ser 0.80 0.50 - 1.10 mg/dL   Glucose, Bld 151 (H) 70 - 99 mg/dL   Calcium, Ion 1.07 (L) 1.13 - 1.30 mmol/L   TCO2 26 0 - 100 mmol/L   Hemoglobin 12.6 12.0 - 15.0 g/dL   HCT 37.0 36.0 - 46.0 %   Laboratory interpretation all normal except leukocytosis, hyponatremia, low chloride c/w dehydration    Imaging Review Dg Chest 2 View  03/11/2014   CLINICAL DATA:  Acute onset of productive cough and nausea. Initial encounter.  EXAM: CHEST  2 VIEW  COMPARISON:  Chest radiograph performed 12/05/2012  FINDINGS: The lungs are hyperexpanded, with flattening of the hemidiaphragms, compatible with COPD. Mild right middle lobe airspace opacity is suggested. There is no evidence of pleural effusion or pneumothorax. Mild peribronchial thickening is noted.  The heart is borderline normal in size; the mediastinal contour is within normal limits. No acute osseous abnormalities are seen. Right convex thoracic scoliosis is noted.  IMPRESSION: 1. Suggestion of mild right middle lobe airspace opacity, which may reflect mild pneumonia. 2. Findings of COPD. 3. Right convex thoracic scoliosis noted.   Electronically Signed   By: Garald Balding M.D.   On: 03/11/2014 02:38     EKG Interpretation None      MDM   Final diagnoses:  CAP (community acquired pneumonia)  Nausea  Dehydration  Hyponatremia    Plan admission   Rolland Porter, MD, Abram Sander     Janice Norrie, MD 03/11/14 [phone]

## 2014-03-11 NOTE — Progress Notes (Signed)
UR completed 

## 2014-03-11 NOTE — ED Notes (Signed)
Patient reports nausea without vomiting. States productive cough with brown sputum. Denies abdominal pain or diarrhea. States saw Dr. Nevada Crane on Friday and was given nausea medication and Mucinex.

## 2014-03-11 NOTE — Progress Notes (Signed)
ANTICOAGULATION CONSULT NOTE - Initial Consult  Pharmacy Consult for Coumadin Indication: atrial fibrillation  No Known Allergies  Patient Measurements: Height: 5\' 4"  (162.6 cm) Weight: 125 lb 9.6 oz (56.972 kg) IBW/kg (Calculated) : 54.7  Vital Signs: Temp: 99 F (37.2 C) (03/03 0540) Temp Source: Oral (03/03 0540) BP: 117/58 mmHg (03/03 0540) Pulse Rate: 94 (03/03 0540)  Labs:  Recent Labs  03/11/14 0340 03/11/14 0416 03/11/14 1022  HGB 12.6 11.1*  --   HCT 37.0 33.0*  --   PLT  --  289  --   LABPROT  --   --  74.8*  INR  --   --  9.12*  CREATININE 0.80  --   --    Estimated Creatinine Clearance: 42 mL/min (by C-G formula based on Cr of 0.8).  Medical History: Past Medical History  Diagnosis Date  . Atrial fibrillation 1993    permanent; onset in 1993  . Hyperthyroidism 2000    2000  . Hypertension     unspecified  . Osteoporosis   . Chronic anticoagulation    Medications:  Prescriptions prior to admission  Medication Sig Dispense Refill Last Dose  . ALPRAZolam (XANAX) 0.5 MG tablet Take 0.5 mg by mouth at bedtime as needed for anxiety or sleep.    unknown  . ascorbic Acid (VITAMIN C CR) 500 MG CPCR Take 500 mg by mouth daily.     Past Week at Unknown time  . atorvastatin (LIPITOR) 40 MG tablet Take 1 tablet (40 mg total) by mouth at bedtime. 30 tablet 6 Past Week at Unknown time  . lisinopril (PRINIVIL,ZESTRIL) 20 MG tablet Take 1 tablet (20 mg total) by mouth daily. 90 tablet 3 Past Week at Unknown time  . metoprolol succinate (TOPROL-XL) 25 MG 24 hr tablet TAKE 1 TABLET BY MOUTH EVERY DAY 30 tablet 6 Past Week at 0900  . Multiple Vitamins-Minerals (CENTRUM SILVER PO) Take 1 tablet by mouth daily.     Past Week at Unknown time  . Multiple Vitamins-Minerals (EYE VITAMINS PO) Take 2 capsules by mouth daily.   Past Week at Unknown time  . Omega-3 Fatty Acids (FISH OIL) 1000 MG CAPS Take 1 capsule by mouth daily.     Past Week at Unknown time  . warfarin  (COUMADIN) 5 MG tablet Take 1 tablet daily except 1 1/2 tablets on Mondays or as directed 45 tablet 3 Past Week at Unknown time   Assessment: 81yo female on chronic Coumadin PTA for h/o afib.  Home dose listed above.  INR is > 9 on admission.  No bleeding noted.  CBC OK.  Goal of Therapy:  INR 2-3 Monitor platelets by anticoagulation protocol: Yes   Plan:   No Coumadin today due to elevated INR  Vitamin K 2.5mg  po now x 1 dose   INR daily until stable  Monitor CBC  Rubena Roseman A 03/11/2014,12:27 PM

## 2014-03-11 NOTE — H&P (Signed)
PCP:   Delphina Cahill, MD   Chief Complaint:  Not feeling well  HPI: 81 yo female comes in with over 5 days of progressively worsening cough and decreased po intake.  Has been n/v for several days now.  No fevers/no chills.  No cp , sob or abd pain.  No swelling in legs.  Saw her pcp last week but her main symptom at that time was nausea.  Not given abx.  Progressively worsening since then.  Pt was given azithro po in the ED and vomited it up.  cxr shows pna.  Asked to obs as cannot tolerate po at this time.  Review of Systems:  Positive and negative as per HPI otherwise all other systems are negative  Past Medical History: Past Medical History  Diagnosis Date  . Atrial fibrillation 1993    permanent; onset in 1993  . Hyperthyroidism 2000    2000  . Hypertension     unspecified  . Osteoporosis   . Chronic anticoagulation    Past Surgical History  Procedure Laterality Date  . Appendectomy    . Total abdominal hysterectomy w/ bilateral salpingoophorectomy      Medications: Prior to Admission medications   Medication Sig Start Date End Date Taking? Authorizing Provider  ALPRAZolam Duanne Moron) 0.5 MG tablet Take 0.5 mg by mouth at bedtime as needed.      Historical Provider, MD  ascorbic Acid (VITAMIN C CR) 500 MG CPCR Take 500 mg by mouth daily.      Historical Provider, MD  atorvastatin (LIPITOR) 40 MG tablet Take 1 tablet (40 mg total) by mouth at bedtime. 03/01/14   Herminio Commons, MD  lisinopril (PRINIVIL,ZESTRIL) 20 MG tablet Take 1 tablet (20 mg total) by mouth daily. 08/14/13   Herminio Commons, MD  metoprolol succinate (TOPROL-XL) 25 MG 24 hr tablet TAKE 1 TABLET BY MOUTH EVERY DAY 03/02/14   Herminio Commons, MD  Multiple Vitamins-Minerals (CENTRUM SILVER PO) Take 1 tablet by mouth daily.      Historical Provider, MD  Omega-3 Fatty Acids (FISH OIL) 1000 MG CAPS Take 1 capsule by mouth daily.      Historical Provider, MD  ondansetron (ZOFRAN ODT) 8 MG disintegrating  tablet Take 1 tablet (8 mg total) by mouth every 8 (eight) hours as needed for nausea. 12/05/12   Harden Mo, MD  potassium chloride (KLOR-CON 10) 10 MEQ tablet Take 1 tablet (10 mEq total) by mouth daily. 12/05/12   Harden Mo, MD  ranitidine (ZANTAC) 150 MG tablet Take 1 tablet (150 mg total) by mouth 2 (two) times daily. 12/05/12   Harden Mo, MD  warfarin (COUMADIN) 5 MG tablet Take 1 tablet daily except 1 1/2 tablets on Mondays or as directed 09/29/13   Herminio Commons, MD    Allergies:  No Known Allergies  Social History:  reports that she has never smoked. She has never used smokeless tobacco. She reports that she does not drink alcohol or use illicit drugs.  Family History: Family History  Problem Relation Age of Onset  . Prostate cancer Father   . Stroke Father   . Heart disease Mother   . Coronary artery disease Sister     with prior CABG surgery    Physical Exam: Filed Vitals:   03/11/14 0107 03/11/14 0212 03/11/14 0444  BP: 118/53  122/68  Pulse: 83  60  Temp: 98.4 F (36.9 C)  98.3 F (36.8 C)  TempSrc: Oral  Oral  Resp: 22  20  Height: 5\' 5"  (1.651 m)    Weight: 56.246 kg (124 lb)    SpO2: 97% 93% 94%   General appearance: alert, cooperative and no distress Head: Normocephalic, without obvious abnormality, atraumatic Eyes: negative Nose: Nares normal. Septum midline. Mucosa normal. No drainage or sinus tenderness. Neck: no JVD and supple, symmetrical, trachea midline Lungs: clear to auscultation bilaterally Heart: regular rate and rhythm, S1, S2 normal, no murmur, click, rub or gallop Abdomen: soft, non-tender; bowel sounds normal; no masses,  no organomegaly Extremities: extremities normal, atraumatic, no cyanosis or edema Pulses: 2+ and symmetric Skin: Skin color, texture, turgor normal. No rashes or lesions Neurologic: Grossly normal    Labs on Admission:   Recent Labs  03/11/14 0340  NA 129*  K 3.6  CL 90*  GLUCOSE 151*  BUN  9  CREATININE 0.80    Recent Labs  03/11/14 0340 03/11/14 0416  WBC  --  13.6*  NEUTROABS  --  11.3*  HGB 12.6 11.1*  HCT 37.0 33.0*  MCV  --  90.4  PLT  --  289   Radiological Exams on Admission: Dg Chest 2 View  03/11/2014   CLINICAL DATA:  Acute onset of productive cough and nausea. Initial encounter.  EXAM: CHEST  2 VIEW  COMPARISON:  Chest radiograph performed 12/05/2012  FINDINGS: The lungs are hyperexpanded, with flattening of the hemidiaphragms, compatible with COPD. Mild right middle lobe airspace opacity is suggested. There is no evidence of pleural effusion or pneumothorax. Mild peribronchial thickening is noted.  The heart is borderline normal in size; the mediastinal contour is within normal limits. No acute osseous abnormalities are seen. Right convex thoracic scoliosis is noted.  IMPRESSION: 1. Suggestion of mild right middle lobe airspace opacity, which may reflect mild pneumonia. 2. Findings of COPD. 3. Right convex thoracic scoliosis noted.   Electronically Signed   By: Garald Balding M.D.   On: 03/11/2014 02:38    Assessment/Plan  81 yo female with CAP  Principal Problem:   CAP (community acquired pneumonia)-  pna pathway, iv rocephin and azithromycin.  obs overnight, likely better by tomorrow after some abx and ivf.  Full liq diet.  Active Problems:   Atrial fibrillation-  stable   Hypertension-  stable   Vomiting  obs on medical.  Full code.  Chavie Kolinski A 03/11/2014, 4:47 AM

## 2014-03-11 NOTE — Progress Notes (Signed)
PT Cancellation Note  Patient Details Name: Renee Cameron MRN: [id number] DOB: Aug 04, 1924   Cancelled Treatment:    Reason Eval/Treat Not Completed: PT screened, no needs identified, will sign off   Sable Feil 03/11/2014, 2:24 PM

## 2014-03-11 NOTE — Progress Notes (Signed)
Patient Demographics  Renee Cameron, is a 81 y.o. female, DOB - 04-06-1924, YDX:[id number]  Admit date - 03/11/2014   Admitting Physician Phillips Grout, MD  Outpatient Primary MD for the patient is Delphina Cahill, MD  LOS -    Chief Complaint  Patient presents with  . Nausea  . Cough        Subjective:   Renee Cameron today has, No headache, No chest pain, No abdominal pain - No Nausea, No new weakness tingling or numbness, No Cough - SOB. No further nausea vomiting or diarrhea.  Assessment & Plan    1. Aspiration -  CAP  - related nausea vomiting, on appropriate IV antibiotics continue azithromycin and switched to Rocephin to Unasyn. Continue supportive care with oxygen and nebulizer treatments as needed. She feels a whole lot better already. Monitor cultures.   2. Gastroenteritis with nausea vomiting. Seems to have resolved. Supportive care.    3. Dehydration with hyponatremia . IV fluids.      4. Permanent atrial fibrillation. Resume home dose beta blocker, pharmacy to monitor and dose Coumadin.    5. Dyslipidemia. Resume home dose statin.    Code Status: Full  Family Communication: None  Disposition Plan: Home   Procedures     Consults  None   Medications  Scheduled Meds: . ampicillin-sulbactam (UNASYN) IV  1.5 g Intravenous Q6H  . atorvastatin  40 mg Oral QHS  . [START ON 03/12/2014] azithromycin  500 mg Intravenous Q24H  . lidocaine (PF)      . metoprolol succinate  25 mg Oral Daily  . [START ON 03/12/2014] pneumococcal 23 valent vaccine  0.5 mL Intramuscular Tomorrow-1000   Continuous Infusions: . sodium chloride     PRN Meds:.albuterol  DVT Prophylaxis  Couamdin - SCDs    Lab Results  Component Value Date   PLT 289 03/11/2014    Antibiotics    Anti-infectives    Start     Dose/Rate Route Frequency Ordered Stop   03/12/14 0600  azithromycin (ZITHROMAX) 500 mg in dextrose 5 % 250 mL IVPB     500 mg 250 mL/hr over 60 Minutes Intravenous Every 24 hours 03/11/14 0536 03/18/14 0559   03/12/14 0000  cefTRIAXone (ROCEPHIN) 1 g in dextrose 5 % 50 mL IVPB - Premix  Status:  Discontinued     1 g 100 mL/hr over 30 Minutes Intravenous Every 24 hours 03/11/14 0537 03/11/14 0720   03/11/14 0900  ampicillin-sulbactam (UNASYN) 1.5 g in sodium chloride 0.9 % 50 mL IVPB     1.5 g 100 mL/hr over 30 Minutes Intravenous Every 6 hours 03/11/14 0720     03/11/14 0545  azithromycin (ZITHROMAX) 500 mg in dextrose 5 % 250 mL IVPB  Status:  Discontinued     500 mg 250 mL/hr over 60 Minutes Intravenous Every 24 hours 03/11/14 0537 03/11/14 0538   03/11/14 0330  azithromycin (ZITHROMAX) 500 mg in dextrose 5 % 250 mL IVPB  Status:  Discontinued     500 mg 250 mL/hr over 60 Minutes Intravenous Every 24 hours 03/11/14 0318 03/11/14 0535   03/11/14 0300  cefTRIAXone (ROCEPHIN) injection 1 g     1 g Intramuscular  Once 03/11/14 0247 03/11/14 0256  03/11/14 0300  azithromycin (ZITHROMAX) tablet 500 mg     500 mg Oral  Once 03/11/14 0254 03/11/14 0302          Objective:   Filed Vitals:   03/11/14 0107 03/11/14 0212 03/11/14 0444 03/11/14 0540  BP: 118/53  122/68 117/58  Pulse: 83  60 94  Temp: 98.4 F (36.9 C)  98.3 F (36.8 C) 99 F (37.2 C)  TempSrc: Oral  Oral Oral  Resp: 22  20 18   Height: 5\' 5"  (1.651 m)   5\' 4"  (1.626 m)  Weight: 56.246 kg (124 lb)   56.972 kg (125 lb 9.6 oz)  SpO2: 97% 93% 94% 97%    Wt Readings from Last 3 Encounters:  03/11/14 56.972 kg (125 lb 9.6 oz)  03/02/14 58.514 kg (129 lb)  09/17/12 56.7 kg (125 lb)    No intake or output data in the 24 hours ending 03/11/14 1015   Physical Exam  Awake Alert, Oriented X 3, No new F.N deficits, Normal affect [place].AT,PERRAL Supple Neck,No JVD, No cervical  lymphadenopathy appriciated.  Symmetrical Chest wall movement, Good air movement bilaterally, CTAB iRRR,No Gallops,Rubs or new Murmurs, No Parasternal Heave +ve B.Sounds, Abd Soft, No tenderness, No organomegaly appriciated, No rebound - guarding or rigidity. No Cyanosis, Clubbing or edema, No new Rash or bruise      Data Review   Micro Results No results found for this or any previous visit (from the past 240 hour(s)).  Radiology Reports Dg Chest 2 View  03/11/2014   CLINICAL DATA:  Acute onset of productive cough and nausea. Initial encounter.  EXAM: CHEST  2 VIEW  COMPARISON:  Chest radiograph performed 12/05/2012  FINDINGS: The lungs are hyperexpanded, with flattening of the hemidiaphragms, compatible with COPD. Mild right middle lobe airspace opacity is suggested. There is no evidence of pleural effusion or pneumothorax. Mild peribronchial thickening is noted.  The heart is borderline normal in size; the mediastinal contour is within normal limits. No acute osseous abnormalities are seen. Right convex thoracic scoliosis is noted.  IMPRESSION: 1. Suggestion of mild right middle lobe airspace opacity, which may reflect mild pneumonia. 2. Findings of COPD. 3. Right convex thoracic scoliosis noted.   Electronically Signed   By: Garald Balding M.D.   On: 03/11/2014 02:38     CBC  Recent Labs Lab 03/11/14 0340 03/11/14 0416  WBC  --  13.6*  HGB 12.6 11.1*  HCT 37.0 33.0*  PLT  --  289  MCV  --  90.4  MCH  --  30.4  MCHC  --  33.6  RDW  --  13.1  LYMPHSABS  --  1.0  MONOABS  --  1.4*  EOSABS  --  0.0  BASOSABS  --  0.0    Chemistries   Recent Labs Lab 03/11/14 0340  NA 129*  K 3.6  CL 90*  GLUCOSE 151*  BUN 9  CREATININE 0.80   ------------------------------------------------------------------------------------------------------------------ estimated creatinine clearance is 42 mL/min (by C-G formula based on Cr of  0.8). ------------------------------------------------------------------------------------------------------------------ No results for input(s): HGBA1C in the last 72 hours. ------------------------------------------------------------------------------------------------------------------ No results for input(s): CHOL, HDL, LDLCALC, TRIG, CHOLHDL, LDLDIRECT in the last 72 hours. ------------------------------------------------------------------------------------------------------------------ No results for input(s): TSH, T4TOTAL, T3FREE, THYROIDAB in the last 72 hours.  Invalid input(s): FREET3 ------------------------------------------------------------------------------------------------------------------ No results for input(s): VITAMINB12, FOLATE, FERRITIN, TIBC, IRON, RETICCTPCT in the last 72 hours.  Coagulation profile No results for input(s): INR, PROTIME in the last 168 hours.  No results  for input(s): DDIMER in the last 72 hours.  Cardiac Enzymes No results for input(s): CKMB, TROPONINI, MYOGLOBIN in the last 168 hours.  Invalid input(s): CK ------------------------------------------------------------------------------------------------------------------ Invalid input(s): POCBNP     Time Spent in minutes   35   SINGH,PRASHANT K M.D on 03/11/2014 at 10:15 AM  Between 7am to 7pm - Pager - [phone]  After 7pm go to [email or web address] - password [facility]  [facility]   Office  [phone]

## 2014-03-12 DIAGNOSIS — J690 Pneumonitis due to inhalation of food and vomit: Secondary | ICD-10-CM | POA: Diagnosis not present

## 2014-03-12 LAB — BASIC METABOLIC PANEL
Anion gap: 6 (ref 5–15)
BUN: 5 mg/dL — ABNORMAL LOW (ref 6–23)
CO2: 27 mmol/L (ref 19–32)
Calcium: 8 mg/dL — ABNORMAL LOW (ref 8.4–10.5)
Chloride: 101 mmol/L (ref 96–112)
Creatinine, Ser: 0.66 mg/dL (ref 0.50–1.10)
GFR calc Af Amer: 89 mL/min — ABNORMAL LOW (ref >90)
GFR calc non Af Amer: 77 mL/min — ABNORMAL LOW (ref >90)
GLUCOSE: 110 mg/dL — AB (ref 70–99)
POTASSIUM: 3.6 mmol/L (ref 3.5–5.1)
SODIUM: 134 mmol/L — AB (ref 135–145)

## 2014-03-12 LAB — CBC WITH DIFFERENTIAL/PLATELET
BASOS ABS: 0 10*3/uL (ref 0.0–0.1)
BASOS PCT: 0 % (ref 0–1)
Eosinophils Absolute: 0.1 10*3/uL (ref 0.0–0.7)
Eosinophils Relative: 1 % (ref 0–5)
HEMATOCRIT: 28.8 % — AB (ref 36.0–46.0)
Hemoglobin: 9.7 g/dL — ABNORMAL LOW (ref 12.0–15.0)
LYMPHS ABS: 1.7 10*3/uL (ref 0.7–4.0)
Lymphocytes Relative: 13 % (ref 12–46)
MCH: 30.4 pg (ref 26.0–34.0)
MCHC: 33.7 g/dL (ref 30.0–36.0)
MCV: 90.3 fL (ref 78.0–100.0)
MONO ABS: 1.3 10*3/uL — AB (ref 0.1–1.0)
MONOS PCT: 10 % (ref 3–12)
Neutro Abs: 9.9 10*3/uL — ABNORMAL HIGH (ref 1.7–7.7)
Neutrophils Relative %: 76 % (ref 43–77)
PLATELETS: 320 10*3/uL (ref 150–400)
RBC: 3.19 MIL/uL — ABNORMAL LOW (ref 3.87–5.11)
RDW: 13.3 % (ref 11.5–15.5)
WBC: 13 10*3/uL — ABNORMAL HIGH (ref 4.0–10.5)

## 2014-03-12 LAB — PROTIME-INR
INR: 3.74 — AB (ref 0.00–1.49)
PROTHROMBIN TIME: 37.3 s — AB (ref 11.6–15.2)

## 2014-03-12 MED ORDER — WARFARIN SODIUM 1 MG PO TABS
0.5000 mg | ORAL_TABLET | Freq: Once | ORAL | Status: AC
  Administered 2014-03-12: 0.5 mg via ORAL
  Filled 2014-03-12: qty 1

## 2014-03-12 MED ORDER — GUAIFENESIN-DM 100-10 MG/5ML PO SYRP
5.0000 mL | ORAL_SOLUTION | ORAL | Status: DC | PRN
  Administered 2014-03-12 – 2014-03-15 (×2): 5 mL via ORAL
  Filled 2014-03-12 (×2): qty 5

## 2014-03-12 MED ORDER — AZITHROMYCIN 250 MG PO TABS
500.0000 mg | ORAL_TABLET | Freq: Every day | ORAL | Status: DC
  Administered 2014-03-13: 500 mg via ORAL
  Filled 2014-03-12: qty 2

## 2014-03-12 MED ORDER — ONDANSETRON HCL 4 MG/2ML IJ SOLN
4.0000 mg | Freq: Four times a day (QID) | INTRAMUSCULAR | Status: DC | PRN
  Administered 2014-03-12 – 2014-03-13 (×4): 4 mg via INTRAVENOUS
  Filled 2014-03-12 (×4): qty 2

## 2014-03-12 MED ORDER — SODIUM CHLORIDE 0.9 % IV SOLN
INTRAVENOUS | Status: AC
  Administered 2014-03-12: 10:00:00 via INTRAVENOUS

## 2014-03-12 NOTE — Progress Notes (Signed)
ANTICOAGULATION CONSULT NOTE  Pharmacy Consult for Coumadin Indication: atrial fibrillation  No Known Allergies  Patient Measurements: Height: 5\' 4"  (162.6 cm) Weight: 125 lb 9.6 oz (56.972 kg) IBW/kg (Calculated) : 54.7  Vital Signs: Temp: 98.8 F (37.1 C) (03/04 0532) Temp Source: Oral (03/04 0532) BP: 121/67 mmHg (03/04 0532) Pulse Rate: 73 (03/04 0532)  Labs:  Recent Labs  03/11/14 0340 03/11/14 0416 03/11/14 1022 03/11/14 1942 03/12/14 0543  HGB 12.6 11.1*  --   --  9.7*  HCT 37.0 33.0*  --   --  28.8*  PLT  --  289  --   --  320  LABPROT  --   --  74.8* 58.6* 37.3*  INR  --   --  9.12* 6.67* 3.74*  CREATININE 0.80  --   --   --  0.66   Estimated Creatinine Clearance: 42 mL/min (by C-G formula based on Cr of 0.66).  Medical History: Past Medical History  Diagnosis Date  . Atrial fibrillation 1993    permanent; onset in 1993  . Hyperthyroidism 2000    2000  . Hypertension     unspecified  . Osteoporosis   . Chronic anticoagulation    Medications:  Prescriptions prior to admission  Medication Sig Dispense Refill Last Dose  . ALPRAZolam (XANAX) 0.5 MG tablet Take 0.5 mg by mouth at bedtime as needed for anxiety or sleep.    unknown  . ascorbic Acid (VITAMIN C CR) 500 MG CPCR Take 500 mg by mouth daily.     Past Week at Unknown time  . atorvastatin (LIPITOR) 40 MG tablet Take 1 tablet (40 mg total) by mouth at bedtime. 30 tablet 6 Past Week at Unknown time  . lisinopril (PRINIVIL,ZESTRIL) 20 MG tablet Take 1 tablet (20 mg total) by mouth daily. 90 tablet 3 Past Week at Unknown time  . metoprolol succinate (TOPROL-XL) 25 MG 24 hr tablet TAKE 1 TABLET BY MOUTH EVERY DAY 30 tablet 6 Past Week at 0900  . Multiple Vitamins-Minerals (CENTRUM SILVER PO) Take 1 tablet by mouth daily.     Past Week at Unknown time  . Multiple Vitamins-Minerals (EYE VITAMINS PO) Take 2 capsules by mouth daily.   Past Week at Unknown time  . Omega-3 Fatty Acids (FISH OIL) 1000 MG CAPS  Take 1 capsule by mouth daily.     Past Week at Unknown time  . warfarin (COUMADIN) 5 MG tablet Take 1 tablet daily except 1 1/2 tablets on Mondays or as directed 45 tablet 3 Past Week at Unknown time   Assessment: 81yo female on chronic Coumadin PTA for h/o afib.  Home dose listed above.  INR was elevated on admission, but is trending own to goal range after Vit K 2.5mg  po given 03/11/14.  No bleeding noted.   Patient continues on Zithromax for PNA which can interact with Coumadin to increase INR.   Goal of Therapy:  INR 2-3   Plan:   Coumadin 0.5mg  po x1 today to prevent sub-therapeutic INR  INR daily   Biagio Borg 03/12/2014,8:10 AM

## 2014-03-12 NOTE — Progress Notes (Signed)
Patient Demographics  Renee Cameron, is a 81 y.o. female, DOB - 1924/12/16, AQT:[id number]  Admit date - 03/11/2014   Admitting Physician Phillips Grout, MD  Outpatient Primary MD for the patient is Delphina Cahill, MD  LOS - 1   Chief Complaint  Patient presents with  . Nausea  . Cough        Subjective:   [name] today has, No headache, No chest pain, No abdominal pain - No Nausea, No new weakness tingling or numbness, No Cough - SOB. No further nausea vomiting or diarrhea.  Assessment & Plan    1. Aspiration -  CAP  - related nausea vomiting, on appropriate IV antibiotics continue azithromycin and switched to Rocephin to Unasyn. Continue supportive care with oxygen and nebulizer treatments as needed. She feels a whole lot better already. Monitor cultures.   2. Gastroenteritis with nausea vomiting. Seems to have resolved. Supportive care.    3. Dehydration with hyponatremia . Improving with IV fluids.      4. Permanent atrial fibrillation. Resumed home dose beta blocker, pharmacy to monitor and dose Coumadin.    5. Dyslipidemia. Resume home dose statin.    6. Supratherapeutic INR. Received vitamin K IV on 03/11/2014, INR trending down. Continue to monitor. Pharmacy on board. Coumadin held.     Code Status: Full  Family Communication: None  Disposition Plan: Home   Procedures     Consults  None   Medications  Scheduled Meds: . ampicillin-sulbactam (UNASYN) IV  1.5 g Intravenous Q6H  . atorvastatin  40 mg Oral QHS  . azithromycin  500 mg Intravenous Q24H  . metoprolol succinate  25 mg Oral Daily  . pneumococcal 23 valent vaccine  0.5 mL Intramuscular Tomorrow-1000  . warfarin  0.5 mg Oral Once  . Warfarin - Pharmacist Dosing Inpatient   Does not apply Q24H     Continuous Infusions: . sodium chloride 50 mL/hr at 03/12/14 0047   PRN Meds:.albuterol, ondansetron (ZOFRAN) IV  DVT Prophylaxis  Couamdin - SCDs    Lab Results  Component Value Date   PLT 320 03/12/2014    Antibiotics   Anti-infectives    Start     Dose/Rate Route Frequency Ordered Stop   03/12/14 0600  azithromycin (ZITHROMAX) 500 mg in dextrose 5 % 250 mL IVPB     500 mg 250 mL/hr over 60 Minutes Intravenous Every 24 hours 03/11/14 0536 03/18/14 0559   03/12/14 0000  cefTRIAXone (ROCEPHIN) 1 g in dextrose 5 % 50 mL IVPB - Premix  Status:  Discontinued     1 g 100 mL/hr over 30 Minutes Intravenous Every 24 hours 03/11/14 0537 03/11/14 0720   03/11/14 0900  ampicillin-sulbactam (UNASYN) 1.5 g in sodium chloride 0.9 % 50 mL IVPB     1.5 g 100 mL/hr over 30 Minutes Intravenous Every 6 hours 03/11/14 0720     03/11/14 0545  azithromycin (ZITHROMAX) 500 mg in dextrose 5 % 250 mL IVPB  Status:  Discontinued     500 mg 250 mL/hr over 60 Minutes Intravenous Every 24 hours 03/11/14 0537 03/11/14 0538   03/11/14 0330  azithromycin (ZITHROMAX) 500 mg in dextrose 5 % 250 mL IVPB  Status:  Discontinued  500 mg 250 mL/hr over 60 Minutes Intravenous Every 24 hours 03/11/14 0318 03/11/14 0535   03/11/14 0300  cefTRIAXone (ROCEPHIN) injection 1 g     1 g Intramuscular  Once 03/11/14 0247 03/11/14 0256   03/11/14 0300  azithromycin (ZITHROMAX) tablet 500 mg     500 mg Oral  Once 03/11/14 0254 03/11/14 0302          Objective:   Filed Vitals:   03/11/14 0540 03/11/14 1528 03/11/14 2116 03/12/14 0532  BP: 117/58 124/61 135/69 121/67  Pulse: 94 85 103 73  Temp: 99 F (37.2 C) 98.7 F (37.1 C) 98.3 F (36.8 C) 98.8 F (37.1 C)  TempSrc: Oral Oral Oral Oral  Resp: 18 20 20 18   Height: 5\' 4"  (1.626 m)     Weight: 56.972 kg (125 lb 9.6 oz)     SpO2: 97% 98% 95% 95%    Wt Readings from Last 3 Encounters:  03/11/14 56.972 kg (125 lb 9.6 oz)  03/02/14 58.514 kg (129 lb)   09/17/12 56.7 kg (125 lb)     Intake/Output Summary (Last 24 hours) at 03/12/14 7654 Last data filed at 03/12/14 0600  Gross per 24 hour  Intake   1785 ml  Output    700 ml  Net   1085 ml     Physical Exam  Awake Alert, Oriented X 3, No new F.N deficits, Normal affect [place].AT,PERRAL Supple Neck,No JVD, No cervical lymphadenopathy appriciated.  Symmetrical Chest wall movement, Good air movement bilaterally, CTAB iRRR,No Gallops,Rubs or new Murmurs, No Parasternal Heave +ve B.Sounds, Abd Soft, No tenderness, No organomegaly appriciated, No rebound - guarding or rigidity. No Cyanosis, Clubbing or edema, No new Rash or bruise      Data Review   Micro Results Recent Results (from the past 240 hour(s))  Culture, blood (routine x 2) Call MD if unable to obtain prior to antibiotics being given     Status: None (Preliminary result)   Collection Time: 03/11/14  5:47 AM  Result Value Ref Range Status   Specimen Description BLOOD RIGHT ARM  Final   Special Requests BOTTLES DRAWN AEROBIC AND ANAEROBIC 5CC  Final   Culture NO GROWTH 1 DAY  Final   Report Status PENDING  Incomplete  Culture, blood (routine x 2) Call MD if unable to obtain prior to antibiotics being given     Status: None (Preliminary result)   Collection Time: 03/11/14  5:47 AM  Result Value Ref Range Status   Specimen Description BLOOD RIGHT ARM  Final   Special Requests BOTTLES DRAWN AEROBIC AND ANAEROBIC 5CC  Final   Culture NO GROWTH 1 DAY  Final   Report Status PENDING  Incomplete    Radiology Reports Dg Chest 2 View  03/11/2014   CLINICAL DATA:  Acute onset of productive cough and nausea. Initial encounter.  EXAM: CHEST  2 VIEW  COMPARISON:  Chest radiograph performed 12/05/2012  FINDINGS: The lungs are hyperexpanded, with flattening of the hemidiaphragms, compatible with COPD. Mild right middle lobe airspace opacity is suggested. There is no evidence of pleural effusion or pneumothorax. Mild peribronchial  thickening is noted.  The heart is borderline normal in size; the mediastinal contour is within normal limits. No acute osseous abnormalities are seen. Right convex thoracic scoliosis is noted.  IMPRESSION: 1. Suggestion of mild right middle lobe airspace opacity, which may reflect mild pneumonia. 2. Findings of COPD. 3. Right convex thoracic scoliosis noted.   Electronically Signed   By: Jacqulynn Cadet  Chang M.D.   On: 03/11/2014 02:38     CBC  Recent Labs Lab 03/11/14 0340 03/11/14 0416 03/12/14 0543  WBC  --  13.6* 13.0*  HGB 12.6 11.1* 9.7*  HCT 37.0 33.0* 28.8*  PLT  --  289 320  MCV  --  90.4 90.3  MCH  --  30.4 30.4  MCHC  --  33.6 33.7  RDW  --  13.1 13.3  LYMPHSABS  --  1.0 1.7  MONOABS  --  1.4* 1.3*  EOSABS  --  0.0 0.1  BASOSABS  --  0.0 0.0    Chemistries   Recent Labs Lab 03/11/14 0340 03/12/14 0543  NA 129* 134*  K 3.6 3.6  CL 90* 101  CO2  --  27  GLUCOSE 151* 110*  BUN 9 5*  CREATININE 0.80 0.66  CALCIUM  --  8.0*   ------------------------------------------------------------------------------------------------------------------ estimated creatinine clearance is 42 mL/min (by C-G formula based on Cr of 0.66). ------------------------------------------------------------------------------------------------------------------ No results for input(s): HGBA1C in the last 72 hours. ------------------------------------------------------------------------------------------------------------------ No results for input(s): CHOL, HDL, LDLCALC, TRIG, CHOLHDL, LDLDIRECT in the last 72 hours. ------------------------------------------------------------------------------------------------------------------ No results for input(s): TSH, T4TOTAL, T3FREE, THYROIDAB in the last 72 hours.  Invalid input(s): FREET3 ------------------------------------------------------------------------------------------------------------------ No results for input(s): VITAMINB12, FOLATE,  FERRITIN, TIBC, IRON, RETICCTPCT in the last 72 hours.  Coagulation profile  Recent Labs Lab 03/11/14 1022 03/11/14 1942 03/12/14 0543  INR 9.12* 6.67* 3.74*    No results for input(s): DDIMER in the last 72 hours.  Cardiac Enzymes No results for input(s): CKMB, TROPONINI, MYOGLOBIN in the last 168 hours.  Invalid input(s): CK ------------------------------------------------------------------------------------------------------------------ Invalid input(s): POCBNP     Time Spent in minutes   35   Mckinzie Saksa K M.D on 03/12/2014 at 9:22 AM  Between 7am to 7pm - Pager - [phone]  After 7pm go to [email or web address] - password [facility]  [facility]   Office  [phone]

## 2014-03-12 NOTE — Progress Notes (Signed)
PHARMACIST - PHYSICIAN COMMUNICATION DR:   Candiss Norse CONCERNING: Antibiotic IV to Oral Route Change Policy  RECOMMENDATION: This patient is receiving Zithromax by the intravenous route.  Based on criteria approved by the Pharmacy and Therapeutics Committee, the antibiotic(s) is/are being converted to the equivalent oral dose form(s).   DESCRIPTION: These criteria include:  Patient being treated for a respiratory tract infection, urinary tract infection, cellulitis or clostridium difficile associated diarrhea if on metronidazole  The patient is not neutropenic and does not exhibit a GI malabsorption state  The patient is eating (either orally or via tube) and/or has been taking other orally administered medications for a least 24 hours  The patient is improving clinically and has a Tmax < 100.5  If you have questions about this conversion, please contact the Pharmacy Department  [x]   [phone] )  Renee Cameron []   [phone] )  Renee Cameron  []   [phone] )  [facility] []   [phone] )  [place], PharmD, BCPS 03/12/2014@10 :28 AM

## 2014-03-13 ENCOUNTER — Inpatient Hospital Stay (HOSPITAL_COMMUNITY): Payer: Medicare Other

## 2014-03-13 DIAGNOSIS — D500 Iron deficiency anemia secondary to blood loss (chronic): Secondary | ICD-10-CM

## 2014-03-13 DIAGNOSIS — R110 Nausea: Secondary | ICD-10-CM

## 2014-03-13 LAB — PROTIME-INR
INR: 2.97 — ABNORMAL HIGH (ref 0.00–1.49)
Prothrombin Time: 31.1 seconds — ABNORMAL HIGH (ref 11.6–15.2)

## 2014-03-13 LAB — CBC
HCT: 26.5 % — ABNORMAL LOW (ref 36.0–46.0)
HEMOGLOBIN: 8.9 g/dL — AB (ref 12.0–15.0)
MCH: 30.5 pg (ref 26.0–34.0)
MCHC: 33.6 g/dL (ref 30.0–36.0)
MCV: 90.8 fL (ref 78.0–100.0)
Platelets: 302 10*3/uL (ref 150–400)
RBC: 2.92 MIL/uL — ABNORMAL LOW (ref 3.87–5.11)
RDW: 13.4 % (ref 11.5–15.5)
WBC: 14.8 10*3/uL — AB (ref 4.0–10.5)

## 2014-03-13 MED ORDER — PROMETHAZINE HCL 25 MG/ML IJ SOLN
12.5000 mg | Freq: Once | INTRAMUSCULAR | Status: AC
  Administered 2014-03-13: 12.5 mg via INTRAVENOUS
  Filled 2014-03-13: qty 1

## 2014-03-13 MED ORDER — PANTOPRAZOLE SODIUM 40 MG PO TBEC
40.0000 mg | DELAYED_RELEASE_TABLET | Freq: Two times a day (BID) | ORAL | Status: DC
  Administered 2014-03-13 – 2014-03-18 (×10): 40 mg via ORAL
  Filled 2014-03-13 (×10): qty 1

## 2014-03-13 MED ORDER — ONDANSETRON HCL 4 MG/2ML IJ SOLN
4.0000 mg | Freq: Three times a day (TID) | INTRAMUSCULAR | Status: DC
  Administered 2014-03-13 – 2014-03-14 (×4): 4 mg via INTRAVENOUS
  Filled 2014-03-13 (×5): qty 2

## 2014-03-13 MED ORDER — LEVALBUTEROL HCL 0.63 MG/3ML IN NEBU
0.6300 mg | INHALATION_SOLUTION | Freq: Four times a day (QID) | RESPIRATORY_TRACT | Status: DC
  Administered 2014-03-13 – 2014-03-16 (×10): 0.63 mg via RESPIRATORY_TRACT
  Filled 2014-03-13 (×10): qty 3

## 2014-03-13 MED ORDER — LEVOFLOXACIN IN D5W 750 MG/150ML IV SOLN
750.0000 mg | INTRAVENOUS | Status: DC
  Administered 2014-03-13 – 2014-03-17 (×3): 750 mg via INTRAVENOUS
  Filled 2014-03-13 (×3): qty 150

## 2014-03-13 MED ORDER — PANTOPRAZOLE SODIUM 40 MG IV SOLR
40.0000 mg | Freq: Two times a day (BID) | INTRAVENOUS | Status: DC
  Administered 2014-03-13: 40 mg via INTRAVENOUS
  Filled 2014-03-13: qty 40

## 2014-03-13 MED ORDER — LEVALBUTEROL TARTRATE 45 MCG/ACT IN AERO: 2.0000 | INHALATION_SPRAY | Freq: Four times a day (QID) | RESPIRATORY_TRACT | Status: DC

## 2014-03-13 MED ORDER — PANTOPRAZOLE SODIUM 40 MG PO TBEC: 40.0000 mg | DELAYED_RELEASE_TABLET | Freq: Every day | ORAL | Status: DC

## 2014-03-13 MED ORDER — BENZONATATE 100 MG PO CAPS
100.0000 mg | ORAL_CAPSULE | Freq: Three times a day (TID) | ORAL | Status: DC
  Administered 2014-03-13 – 2014-03-18 (×16): 100 mg via ORAL
  Filled 2014-03-13 (×16): qty 1

## 2014-03-13 MED ORDER — ALPRAZOLAM 0.5 MG PO TABS
0.5000 mg | ORAL_TABLET | Freq: Every evening | ORAL | Status: DC | PRN
  Administered 2014-03-13 – 2014-03-17 (×4): 0.5 mg via ORAL
  Filled 2014-03-13 (×4): qty 1

## 2014-03-13 NOTE — Progress Notes (Signed)
Notified on call md about patient's bruising on abdomen.  Patient's bruising looks siginificantly worse than previous shift.  Patient complains of no abdominal pain, just soreness to abdomen from coughing.  Patient bruising is a dark maroon colored red, extending to upper abdominal area and around left thigh.  Received no orders at this time, will continue to monitor patient.

## 2014-03-13 NOTE — Consult Note (Addendum)
Referring Provider: No ref. provider found Primary Care Physician:  Delphina Cahill, MD Primary Gastroenterologist:  Barney Drain  Reason for Consultation:  NAUSEA   Impression: ADMITTED WITH PERSISTENT DRY COUGH AND NAUSEA. COUGH LIKELY STIMULATING GAG REFLEX AND DRIVING THE NAUSEA. NAUSEA LIKELY EXACERBATED BY UNASYN AND ZMX. NO VOMITING OR OTHER SYMPTOMS OF GI UPSET. PMHX: AFIB ON COUMADIN.   Plan: 1. XOPENEX Q6H AND ALBUTEROL PRN. 2. TESSALON PERLES TID 3. D/C UNASYN/ZITHROMAX AND CHANGE TO LEVAQUIN FOR CAP. CONTINUE TO MONITOR NAUSEA. 4. BID PPI 5. ZOFRAN QAC  DISCUSSED PLAN WITH PT AND DAUGHTER.  HPI:  HISTORY OBTAINED FROM PT AND DAUGHTER(MRS. SETLIFF). PT REPORTS STARTED HAVING COUGH AND NAUSEA LAST WED. COUGH HAS BEEN NONPRODUCTIVE. HER CHEST HURTS WHEN SHE COUGHS. SHE HAS BEEN COUGHING SO MUCH IT HAS MADE HER ABDOMEN SORE TO THE TOUCH.   PT DENIES FEVER, CHILLS, HEMATOCHEZIA, vomiting, melena, diarrhea, SHORTNESS OF BREATH,  CHANGE IN BOWEL IN HABITS, constipation, problems swallowing, PAIN WITH SWALLOWING, heartburn or indigestion.   Past Medical History  Diagnosis Date  . Atrial fibrillation 1993    permanent; onset in 1993  . Hyperthyroidism 2000    2000  . Hypertension     unspecified  . Osteoporosis   . Chronic anticoagulation     Past Surgical History  Procedure Laterality Date  . Appendectomy    . Total abdominal hysterectomy w/ bilateral salpingoophorectomy      Prior to Admission medications   Medication Sig Start Date End Date Taking? Authorizing Provider  ALPRAZolam Duanne Moron) 0.5 MG tablet Take 0.5 mg by mouth at bedtime as needed for anxiety or sleep.    Yes Historical Provider, MD  ascorbic Acid (VITAMIN C CR) 500 MG CPCR Take 500 mg by mouth daily.     Yes Historical Provider, MD  atorvastatin (LIPITOR) 40 MG tablet Take 1 tablet (40 mg total) by mouth at bedtime. 03/01/14  Yes Herminio Commons, MD  lisinopril (PRINIVIL,ZESTRIL) 20 MG tablet Take 1  tablet (20 mg total) by mouth daily. 08/14/13  Yes Herminio Commons, MD  metoprolol succinate (TOPROL-XL) 25 MG 24 hr tablet TAKE 1 TABLET BY MOUTH EVERY DAY 03/02/14  Yes Herminio Commons, MD  Multiple Vitamins-Minerals (CENTRUM SILVER PO) Take 1 tablet by mouth daily.     Yes Historical Provider, MD  Multiple Vitamins-Minerals (EYE VITAMINS PO) Take 2 capsules by mouth daily.   Yes Historical Provider, MD  Omega-3 Fatty Acids (FISH OIL) 1000 MG CAPS Take 1 capsule by mouth daily.     Yes Historical Provider, MD  warfarin (COUMADIN) 5 MG tablet Take 1 tablet daily except 1 1/2 tablets on Mondays or as directed 09/29/13  Yes Herminio Commons, MD    Current Facility-Administered Medications  Medication Dose Route Frequency Provider Last Rate Last Dose  . 0.9 %  sodium chloride infusion   Intravenous Continuous Thurnell Lose, MD 50 mL/hr at 03/12/14 1006    . albuterol (PROVENTIL) (2.5 MG/3ML) 0.083% nebulizer solution 2.5 mg  2.5 mg Nebulization Q4H PRN Phillips Grout, MD      . ampicillin-sulbactam (UNASYN) 1.5 g in sodium chloride 0.9 % 50 mL IVPB  1.5 g Intravenous Q6H Thurnell Lose, MD   1.5 g at 03/13/14 0349  . atorvastatin (LIPITOR) tablet 40 mg  40 mg Oral QHS Thurnell Lose, MD   40 mg at 03/12/14 2118  . azithromycin (ZITHROMAX) tablet 500 mg  500 mg Oral Daily Thurnell Lose, MD      .  guaiFENesin-dextromethorphan (ROBITUSSIN DM) 100-10 MG/5ML syrup 5 mL  5 mL Oral Q4H PRN Thurnell Lose, MD   5 mL at 03/12/14 1630  . metoprolol succinate (TOPROL-XL) 24 hr tablet 25 mg  25 mg Oral Daily Thurnell Lose, MD   25 mg at 03/12/14 1000  . ondansetron (ZOFRAN) injection 4 mg  4 mg Intravenous Q6H PRN Jeryl Columbia, NP   4 mg at 03/13/14 0030  . Warfarin - Pharmacist Dosing Inpatient   Does not apply Q24H Thurnell Lose, MD   Stopped at 03/11/14 1231    Allergies as of 03/11/2014  . (No Known Allergies)    Family History  Problem Relation Age of Onset  .  Prostate cancer Father   . Stroke Father   . Heart disease Mother   . Coronary artery disease Sister     with prior CABG surgery   History   Social History  . Marital Status: Widowed    Spouse Name: N/A  . Number of Children: N/A  . Years of Education: N/A   Occupational History  . Retired     [place] Topics  . Smoking status: Never Smoker   . Smokeless tobacco: Never Used  . Alcohol Use: No  . Drug Use: No  . Sexual Activity: Not on file   Social History Narrative   Volunteers 2 days weekly. LIVES ALONE. DRIVES HERSELF. CLEANS HER OWN HOUSE.    Review of Systems: PER HPI OTHERWISE ALL SYSTEMS ARE NEGATIVE.  Vitals: Blood pressure 141/73, pulse 118, temperature 99.2 F (37.3 C), temperature source Oral, resp. rate 20, height 5\' 4"  (1.626 m), weight 125 lb 9.6 oz (56.972 kg), SpO2 94 %.  Physical Exam: General:   Alert,  Well-developed, well-nourished, pleasant and cooperative in NAD Head:  Normocephalic and atraumatic. Eyes:  Sclera clear, no icterus.   Conjunctiva pink. Neck:  Supple; no masses. Lungs:  Clear throughout to auscultation.   No wheezes. No acute distress. Heart:  Regular rate, IRREGULAR rhythm; no murmurs. Abdomen:  Soft, nontender and nondistended. No masses, hepatosplenomegaly or hernias noted. Normal bowel sounds, without guarding, and without rebound.   Msk:  Symmetrical without gross deformities.  Extremities:  Without edema. Neurologic:  Alert and  oriented x4;  grossly normal neurologically. Cervical Nodes:  No significant cervical adenopathy. Psych:  Alert and cooperative. Normal mood and affect.  Lab Results:  Recent Labs  03/11/14 0416 03/12/14 0543 03/13/14 0614  WBC 13.6* 13.0* 14.8*  HGB 11.1* 9.7* 8.9*  HCT 33.0* 28.8* 26.5*  PLT 289 320 302   BMET  Recent Labs  03/11/14 0340 03/12/14 0543  NA 129* 134*  K 3.6 3.6  CL 90* 101  CO2  --  27  GLUCOSE 151* 110*  BUN 9 5*   CREATININE 0.80 0.66  CALCIUM  --  8.0*    Studies/Results: CXR MAR 3/MAR 5- R PNEUMONIA   LOS: 2 days   Annell Canty  03/13/2014, 8:09 AM

## 2014-03-13 NOTE — Progress Notes (Signed)
[place] for Coumadin Indication: atrial fibrillation  No Known Allergies  Patient Measurements: Height: 5\' 4"  (162.6 cm) Weight: 125 lb 9.6 oz (56.972 kg) IBW/kg (Calculated) : 54.7  Vital Signs: Temp: 99.2 F (37.3 C) (03/05 0402) Temp Source: Oral (03/05 0402) BP: 141/73 mmHg (03/05 0402) Pulse Rate: 118 (03/05 0402)  Labs:  Recent Labs  03/11/14 0340 03/11/14 0416  03/11/14 1942 03/12/14 0543 03/13/14 0614  HGB 12.6 11.1*  --   --  9.7* 8.9*  HCT 37.0 33.0*  --   --  28.8* 26.5*  PLT  --  289  --   --  320 302  LABPROT  --   --   < > 58.6* 37.3* 31.1*  INR  --   --   < > 6.67* 3.74* 2.97*  CREATININE 0.80  --   --   --  0.66  --   < > = values in this interval not displayed. Estimated Creatinine Clearance: 42 mL/min (by C-G formula based on Cr of 0.66).  Medical History: Past Medical History  Diagnosis Date  . Atrial fibrillation 1993    permanent; onset in 1993  . Hyperthyroidism 2000    2000  . Hypertension     unspecified  . Osteoporosis   . Chronic anticoagulation    Medications:  Prescriptions prior to admission  Medication Sig Dispense Refill Last Dose  . ALPRAZolam (XANAX) 0.5 MG tablet Take 0.5 mg by mouth at bedtime as needed for anxiety or sleep.    unknown  . ascorbic Acid (VITAMIN C CR) 500 MG CPCR Take 500 mg by mouth daily.     Past Week at Unknown time  . atorvastatin (LIPITOR) 40 MG tablet Take 1 tablet (40 mg total) by mouth at bedtime. 30 tablet 6 Past Week at Unknown time  . lisinopril (PRINIVIL,ZESTRIL) 20 MG tablet Take 1 tablet (20 mg total) by mouth daily. 90 tablet 3 Past Week at Unknown time  . metoprolol succinate (TOPROL-XL) 25 MG 24 hr tablet TAKE 1 TABLET BY MOUTH EVERY DAY 30 tablet 6 Past Week at 0900  . Multiple Vitamins-Minerals (CENTRUM SILVER PO) Take 1 tablet by mouth daily.     Past Week at Unknown time  . Multiple Vitamins-Minerals (EYE VITAMINS PO) Take 2 capsules by mouth daily.    Past Week at Unknown time  . Omega-3 Fatty Acids (FISH OIL) 1000 MG CAPS Take 1 capsule by mouth daily.     Past Week at Unknown time  . warfarin (COUMADIN) 5 MG tablet Take 1 tablet daily except 1 1/2 tablets on Mondays or as directed 45 tablet 3 Past Week at Unknown time   Assessment: 81yo female on chronic Coumadin PTA for h/o afib.  Home dose listed above.  INR was elevated on admission (>9). Vit K 2.5mg  po given 03/11/14.  INR is in goal range today.  MD requests no more Coumadin doses until INR<2.5.  No active bleeding noted, abdominal wall bruising stable per MD note.   Patient continues on Zithromax for PNA which can interact with Coumadin to increase INR.   Goal of Therapy:  INR 2-3   Plan:   Hold Coumadin until INR<2.5 per MD  INR daily   Biagio Borg 03/13/2014,8:48 AM

## 2014-03-13 NOTE — Progress Notes (Signed)
Patient Demographics  Renee Cameron, is a 81 y.o. female, DOB - 09-13-1924, LZJ:[id number]  Admit date - 03/11/2014   Admitting Physician Phillips Grout, MD  Outpatient Primary MD for the patient is Delphina Cahill, MD  LOS - 2   Chief Complaint  Patient presents with  . Nausea  . Cough      Summary  This is a pleasant 81 year old female with history of permanent atrial fibrillation on Coumadin, hypothyroidism, central hypertension was been having chronic nausea for the last 10 days, subsequently developed some vomiting and diarrhea 4 days ago, she aspirated some of her vomitus and I will up the cough and shortness of breath. Came to the ER where she was diagnosed with aspiration pneumonia. She has been placed on appropriate IV antibiotics with improvement in her pneumonia symptoms, she however continues to have persistent nausea.  Upon admission her INR was noted to be supratherapeutic close to 10, she received vitamin K and INR has come down to 2.6, she has to limp some bruising in her abdominal wall due to source of vomiting prior to admission, she is no evidence of GI bleed, however she continues to have intense nausea worse at nighttime for which I'm in a place her on IV PPI and request GI to evaluate. INR is under 3. If GI considers EGD we can continue to hold Coumadin for another day or so.    Subjective:   Renee Cameron today has, No headache, No chest pain, No abdominal pain - No Nausea, No new weakness tingling or numbness, No Cough - SOB. No further nausea vomiting or diarrhea.  Assessment & Plan    1. Aspiration -  CAP  - related to nausea vomiting, on appropriate IV antibiotics continue azithromycin and switched to Rocephin to Unasyn, he is on both antibiotics from 03/11/2014 . Continue  supportive care with oxygen and nebulizer treatments as needed. She feels a whole lot better already. Cough shortness of breath have resolved. We'll repeat 2 view chest x-ray as she still has some leukocytosis. Cultures negative thus far.   2. Gastroenteritis with nausea vomiting. although her diarrhea and vomiting have resolved her nausea continues to be persistent, she says nausea has been present off and on for the last 10 days, never had similar problem before, will place her on twice a day IV PPI and request GI to evaluate as this seems to be her primary underlying issue.    3. Dehydration with hyponatremia . Improving with IV fluids.      4. Permanent atrial fibrillation. Resumed home dose beta blocker, pharmacy to monitor and dose Coumadin.    5. Dyslipidemia. Resume home dose statin.    6. Supratherapeutic INR. Received vitamin K IV on 03/11/2014, INR trending down. Continue to monitor. Pharmacy on board. Abdominal wall bruising stable.     Code Status: Full  Family Communication: None  Disposition Plan: Home   Procedures     Consults  None   Medications  Scheduled Meds: . ampicillin-sulbactam (UNASYN) IV  1.5 g Intravenous Q6H  . atorvastatin  40 mg Oral QHS  . azithromycin  500 mg Oral Daily  . metoprolol succinate  25 mg Oral Daily  . pantoprazole (PROTONIX) IV  40 mg  Intravenous Q12H  . Warfarin - Pharmacist Dosing Inpatient   Does not apply Q24H   Continuous Infusions: . sodium chloride 50 mL/hr at 03/12/14 1006   PRN Meds:.albuterol, guaiFENesin-dextromethorphan, ondansetron (ZOFRAN) IV  DVT Prophylaxis  Couamdin - SCDs    Lab Results  Component Value Date   PLT 302 03/13/2014    Antibiotics   Anti-infectives    Start     Dose/Rate Route Frequency Ordered Stop   03/13/14 1000  azithromycin (ZITHROMAX) tablet 500 mg     500 mg Oral Daily 03/12/14 1029     03/12/14 0600  azithromycin (ZITHROMAX) 500 mg in dextrose 5 % 250 mL IVPB  Status:   Discontinued     500 mg 250 mL/hr over 60 Minutes Intravenous Every 24 hours 03/11/14 0536 03/12/14 1029   03/12/14 0000  cefTRIAXone (ROCEPHIN) 1 g in dextrose 5 % 50 mL IVPB - Premix  Status:  Discontinued     1 g 100 mL/hr over 30 Minutes Intravenous Every 24 hours 03/11/14 0537 03/11/14 0720   03/11/14 0900  ampicillin-sulbactam (UNASYN) 1.5 g in sodium chloride 0.9 % 50 mL IVPB     1.5 g 100 mL/hr over 30 Minutes Intravenous Every 6 hours 03/11/14 0720     03/11/14 0545  azithromycin (ZITHROMAX) 500 mg in dextrose 5 % 250 mL IVPB  Status:  Discontinued     500 mg 250 mL/hr over 60 Minutes Intravenous Every 24 hours 03/11/14 0537 03/11/14 0538   03/11/14 0330  azithromycin (ZITHROMAX) 500 mg in dextrose 5 % 250 mL IVPB  Status:  Discontinued     500 mg 250 mL/hr over 60 Minutes Intravenous Every 24 hours 03/11/14 0318 03/11/14 0535   03/11/14 0300  cefTRIAXone (ROCEPHIN) injection 1 g     1 g Intramuscular  Once 03/11/14 0247 03/11/14 0256   03/11/14 0300  azithromycin (ZITHROMAX) tablet 500 mg     500 mg Oral  Once 03/11/14 0254 03/11/14 0302          Objective:   Filed Vitals:   03/12/14 0532 03/12/14 1312 03/12/14 2043 03/13/14 0402  BP: 121/67 131/53 120/56 141/73  Pulse: 73 83 109 118  Temp: 98.8 F (37.1 C) 98.8 F (37.1 C) 98.9 F (37.2 C) 99.2 F (37.3 C)  TempSrc: Oral Oral Oral Oral  Resp: 18 18 20 20   Height:      Weight:      SpO2: 95% 96% 97% 94%    Wt Readings from Last 3 Encounters:  03/11/14 56.972 kg (125 lb 9.6 oz)  03/02/14 58.514 kg (129 lb)  09/17/12 56.7 kg (125 lb)     Intake/Output Summary (Last 24 hours) at 03/13/14 0829 Last data filed at 03/13/14 0700  Gross per 24 hour  Intake   1085 ml  Output   1550 ml  Net   -465 ml     Physical Exam  Awake Alert, Oriented X 3, No new F.N deficits, Normal affect .AT,PERRAL Supple Neck,No JVD, No cervical lymphadenopathy appriciated.  Symmetrical Chest wall movement, Good air movement  bilaterally, CTAB iRRR,No Gallops,Rubs or new Murmurs, No Parasternal Heave +ve B.Sounds, Abd Soft, No tenderness, No organomegaly appriciated, No rebound - guarding or rigidity. No Cyanosis, Clubbing or edema, No new Rash , abdominal wall is bruised   Data Review   Micro Results Recent Results (from the past 240 hour(s))  Culture, blood (routine x 2) Call MD if unable to obtain prior to antibiotics being given  Status: None (Preliminary result)   Collection Time: 03/11/14  5:47 AM  Result Value Ref Range Status   Specimen Description BLOOD RIGHT ARM  Final   Special Requests BOTTLES DRAWN AEROBIC AND ANAEROBIC 5CC  Final   Culture NO GROWTH 1 DAY  Final   Report Status PENDING  Incomplete  Culture, blood (routine x 2) Call MD if unable to obtain prior to antibiotics being given     Status: None (Preliminary result)   Collection Time: 03/11/14  5:47 AM  Result Value Ref Range Status   Specimen Description BLOOD RIGHT ARM  Final   Special Requests BOTTLES DRAWN AEROBIC AND ANAEROBIC 5CC  Final   Culture NO GROWTH 1 DAY  Final   Report Status PENDING  Incomplete    Radiology Reports Dg Chest 2 View  03/11/2014   CLINICAL DATA:  Acute onset of productive cough and nausea. Initial encounter.  EXAM: CHEST  2 VIEW  COMPARISON:  Chest radiograph performed 12/05/2012  FINDINGS: The lungs are hyperexpanded, with flattening of the hemidiaphragms, compatible with COPD. Mild right middle lobe airspace opacity is suggested. There is no evidence of pleural effusion or pneumothorax. Mild peribronchial thickening is noted.  The heart is borderline normal in size; the mediastinal contour is within normal limits. No acute osseous abnormalities are seen. Right convex thoracic scoliosis is noted.  IMPRESSION: 1. Suggestion of mild right middle lobe airspace opacity, which may reflect mild pneumonia. 2. Findings of COPD. 3. Right convex thoracic scoliosis noted.   Electronically Signed   By: Garald Balding  M.D.   On: 03/11/2014 02:38     CBC  Recent Labs Lab 03/11/14 0340 03/11/14 0416 03/12/14 0543 03/13/14 0614  WBC  --  13.6* 13.0* 14.8*  HGB 12.6 11.1* 9.7* 8.9*  HCT 37.0 33.0* 28.8* 26.5*  PLT  --  289 320 302  MCV  --  90.4 90.3 90.8  MCH  --  30.4 30.4 30.5  MCHC  --  33.6 33.7 33.6  RDW  --  13.1 13.3 13.4  LYMPHSABS  --  1.0 1.7  --   MONOABS  --  1.4* 1.3*  --   EOSABS  --  0.0 0.1  --   BASOSABS  --  0.0 0.0  --     Chemistries   Recent Labs Lab 03/11/14 0340 03/12/14 0543  NA 129* 134*  K 3.6 3.6  CL 90* 101  CO2  --  27  GLUCOSE 151* 110*  BUN 9 5*  CREATININE 0.80 0.66  CALCIUM  --  8.0*   ------------------------------------------------------------------------------------------------------------------ estimated creatinine clearance is 42 mL/min (by C-G formula based on Cr of 0.66). ------------------------------------------------------------------------------------------------------------------ No results for input(s): HGBA1C in the last 72 hours. ------------------------------------------------------------------------------------------------------------------ No results for input(s): CHOL, HDL, LDLCALC, TRIG, CHOLHDL, LDLDIRECT in the last 72 hours. ------------------------------------------------------------------------------------------------------------------ No results for input(s): TSH, T4TOTAL, T3FREE, THYROIDAB in the last 72 hours.  Invalid input(s): FREET3 ------------------------------------------------------------------------------------------------------------------ No results for input(s): VITAMINB12, FOLATE, FERRITIN, TIBC, IRON, RETICCTPCT in the last 72 hours.  Coagulation profile  Recent Labs Lab 03/11/14 1022 03/11/14 1942 03/12/14 0543 03/13/14 0614  INR 9.12* 6.67* 3.74* 2.97*    No results for input(s): DDIMER in the last 72 hours.  Cardiac Enzymes No results for input(s): CKMB, TROPONINI, MYOGLOBIN in the last 168  hours.  Invalid input(s): CK ------------------------------------------------------------------------------------------------------------------ Invalid input(s): POCBNP     Time Spent in minutes   35   Lala Lund K M.D on 03/13/2014 at 8:29 AM  Between  7am to 7pm - Pager - [phone]  After 7pm go to [email or web address] - password [facility]  [facility]   Office  [phone]

## 2014-03-13 NOTE — Progress Notes (Signed)
ANTIBIOTIC CONSULT NOTE - FOLLOW UP  Pharmacy Consult for Renal dose adjustment Indication: pneumonia  No Known Allergies  Patient Measurements: Height: 5\' 4"  (162.6 cm) Weight: 125 lb 9.6 oz (56.972 kg) IBW/kg (Calculated) : 54.7  Vital Signs: Temp: 99.2 F (37.3 C) (03/05 0402) Temp Source: Oral (03/05 0402) BP: 141/73 mmHg (03/05 0402) Pulse Rate: 118 (03/05 0402) Intake/Output from previous day: 03/04 0701 - 03/05 0700 In: 1085 [P.O.:590; I.V.:395; IV Piggyback:100] Out: 1550 [Urine:1550] Intake/Output from this shift: Total I/O In: 150 [P.O.:150] Out: -   Labs:  Recent Labs  03/11/14 0340 03/11/14 0416 03/12/14 0543 03/13/14 0614  WBC  --  13.6* 13.0* 14.8*  HGB 12.6 11.1* 9.7* 8.9*  PLT  --  289 320 302  CREATININE 0.80  --  0.66  --    Estimated Creatinine Clearance: 42 mL/min (by C-G formula based on Cr of 0.66). No results for input(s): VANCOTROUGH, VANCOPEAK, VANCORANDOM, GENTTROUGH, GENTPEAK, GENTRANDOM, TOBRATROUGH, TOBRAPEAK, TOBRARND, AMIKACINPEAK, AMIKACINTROU, AMIKACIN in the last 72 hours.   Microbiology: Recent Results (from the past 720 hour(s))  Culture, blood (routine x 2) Call MD if unable to obtain prior to antibiotics being given     Status: None (Preliminary result)   Collection Time: 03/11/14  5:47 AM  Result Value Ref Range Status   Specimen Description BLOOD RIGHT ARM  Final   Special Requests BOTTLES DRAWN AEROBIC AND ANAEROBIC 5CC  Final   Culture NO GROWTH 2 DAYS  Final   Report Status PENDING  Incomplete  Culture, blood (routine x 2) Call MD if unable to obtain prior to antibiotics being given     Status: None (Preliminary result)   Collection Time: 03/11/14  5:47 AM  Result Value Ref Range Status   Specimen Description BLOOD RIGHT ARM  Final   Special Requests BOTTLES DRAWN AEROBIC AND ANAEROBIC 5CC  Final   Culture NO GROWTH 2 DAYS  Final   Report Status PENDING  Incomplete    Anti-infectives    Start     Dose/Rate Route  Frequency Ordered Stop   03/13/14 1600  levofloxacin (LEVAQUIN) IVPB 750 mg     750 mg 100 mL/hr over 90 Minutes Intravenous Every 48 hours 03/13/14 1331     03/13/14 1000  azithromycin (ZITHROMAX) tablet 500 mg  Status:  Discontinued     500 mg Oral Daily 03/12/14 1029 03/13/14 1334   03/12/14 0600  azithromycin (ZITHROMAX) 500 mg in dextrose 5 % 250 mL IVPB  Status:  Discontinued     500 mg 250 mL/hr over 60 Minutes Intravenous Every 24 hours 03/11/14 0536 03/12/14 1029   03/12/14 0000  cefTRIAXone (ROCEPHIN) 1 g in dextrose 5 % 50 mL IVPB - Premix  Status:  Discontinued     1 g 100 mL/hr over 30 Minutes Intravenous Every 24 hours 03/11/14 0537 03/11/14 0720   03/11/14 0900  ampicillin-sulbactam (UNASYN) 1.5 g in sodium chloride 0.9 % 50 mL IVPB  Status:  Discontinued     1.5 g 100 mL/hr over 30 Minutes Intravenous Every 6 hours 03/11/14 0720 03/13/14 1331   03/11/14 0545  azithromycin (ZITHROMAX) 500 mg in dextrose 5 % 250 mL IVPB  Status:  Discontinued     500 mg 250 mL/hr over 60 Minutes Intravenous Every 24 hours 03/11/14 0537 03/11/14 0538   03/11/14 0330  azithromycin (ZITHROMAX) 500 mg in dextrose 5 % 250 mL IVPB  Status:  Discontinued     500 mg 250 mL/hr over 60 Minutes  Intravenous Every 24 hours 03/11/14 0318 03/11/14 0535   03/11/14 0300  cefTRIAXone (ROCEPHIN) injection 1 g     1 g Intramuscular  Once 03/11/14 0247 03/11/14 0256   03/11/14 0300  azithromycin (ZITHROMAX) tablet 500 mg     500 mg Oral  Once 03/11/14 0254 03/11/14 0302      Assessment: 81 yo F on antibiotics for PNA- aspiration vs CAP.  She continues to have nausea/vomiting.  She is afebrile with persistant leukocytosis.  Renal function is at patient's baseline.  Estimated CrCl ~ 40-28ml/min.   GI is changing antibiotics to IV Levaquin.   Discussed with Dr Oneida Alar and will d/c Zithromax as well (duplicate coverage, drug-drug interaction with Levaquin).  Zithromax 3/2 >>3/5 Unasyn 3/2 >>3/5 Levaquin  3/5>>  Goal of Therapy:  Eradicate infection.  Plan:  Change Levaquin to 750mg  IV q48h for CrCl 20-67ml/min Change to oral therapy once appropriate F/U renal function  Renee Cameron, Renee Cameron 03/13/2014,1:37 PM

## 2014-03-14 ENCOUNTER — Inpatient Hospital Stay (HOSPITAL_COMMUNITY): Payer: Medicare Other

## 2014-03-14 DIAGNOSIS — D62 Acute posthemorrhagic anemia: Secondary | ICD-10-CM | POA: Diagnosis not present

## 2014-03-14 DIAGNOSIS — S2020XA Contusion of thorax, unspecified, initial encounter: Secondary | ICD-10-CM | POA: Diagnosis not present

## 2014-03-14 DIAGNOSIS — S2020XD Contusion of thorax, unspecified, subsequent encounter: Secondary | ICD-10-CM

## 2014-03-14 LAB — BASIC METABOLIC PANEL
ANION GAP: 6 (ref 5–15)
BUN: 8 mg/dL (ref 6–23)
CALCIUM: 7.9 mg/dL — AB (ref 8.4–10.5)
CO2: 29 mmol/L (ref 19–32)
Chloride: 98 mmol/L (ref 96–112)
Creatinine, Ser: 0.79 mg/dL (ref 0.50–1.10)
GFR, EST AFRICAN AMERICAN: 84 mL/min — AB (ref >90)
GFR, EST NON AFRICAN AMERICAN: 72 mL/min — AB (ref >90)
GLUCOSE: 106 mg/dL — AB (ref 70–99)
Potassium: 3.7 mmol/L (ref 3.5–5.1)
Sodium: 133 mmol/L — ABNORMAL LOW (ref 135–145)

## 2014-03-14 LAB — CBC
HCT: 24.3 % — ABNORMAL LOW (ref 36.0–46.0)
Hemoglobin: 8.2 g/dL — ABNORMAL LOW (ref 12.0–15.0)
MCH: 30.8 pg (ref 26.0–34.0)
MCHC: 33.7 g/dL (ref 30.0–36.0)
MCV: 91.4 fL (ref 78.0–100.0)
Platelets: 266 10*3/uL (ref 150–400)
RBC: 2.66 MIL/uL — ABNORMAL LOW (ref 3.87–5.11)
RDW: 13.4 % (ref 11.5–15.5)
WBC: 13 10*3/uL — AB (ref 4.0–10.5)

## 2014-03-14 LAB — PROTIME-INR
INR: 3.24 — AB (ref 0.00–1.49)
PROTHROMBIN TIME: 33.3 s — AB (ref 11.6–15.2)

## 2014-03-14 MED ORDER — VITAMIN K1 10 MG/ML IJ SOLN
10.0000 mg | Freq: Every day | INTRAVENOUS | Status: AC
  Administered 2014-03-14: 10 mg via INTRAVENOUS
  Filled 2014-03-14: qty 1

## 2014-03-14 NOTE — Progress Notes (Addendum)
Patient Demographics  Renee Cameron, is a 81 y.o. female, DOB - [birth year], CWC:[id number]  Admit date - 03/11/2014   Admitting Physician Phillips Grout, MD  Outpatient Primary MD for the patient is Delphina Cahill, MD  LOS - 3   Chief Complaint  Patient presents with  . Nausea  . Cough      Summary  This is a pleasant 81 year old female with history of permanent atrial fibrillation on Coumadin, hypothyroidism, central hypertension was been having chronic nausea for the last 10 days, subsequently developed some vomiting and diarrhea 4 days ago, she aspirated some of her vomitus and I will up the cough and shortness of breath. Came to the ER where she was diagnosed with aspiration pneumonia. She has been placed on appropriate IV antibiotics with improvement in her pneumonia symptoms, she however continues to have persistent nausea.  Upon admission her INR was noted to be supratherapeutic close to 10, she received vitamin K and INR has come down to 2.6, she has developed large bruising in her abdominal wall. she displays no evidence of GI bleed, however she continues to have intense nausea. Today (3/6), given her worsening bruising, GI has ordered a CT scan to r/o a RP bleed, given 10 mg of vitamin K and placed coumadin on hold for now.   Subjective:   Renee Cameron today has, No headache, No chest pain, No abdominal pain - No Nausea, No new weakness tingling or numbness, No Cough - SOB. Nausea is improved. Abdominal bruising has become larger overnight.  Assessment & Plan    1. CAP. Patient is symptomatically improved. GI had transitioned over to Levaquin, given thought that azithro may be contributing to her nausea. However, given significant coumadin interaction with levaquin and ongoing abdominal  bruising, will elect to change to augmentin today.  2. Gastroenteritis with nausea vomiting. Improved with taking off azithromycin for her PNA. Continue with symptomatic management.   3. Dehydration with hyponatremia . Improving with IV fluids.      4. Permanent atrial fibrillation. Resumed home dose beta blocker, Coumadin on hold and vitamin K given.    5. Dyslipidemia. Continue statin.    6. Supratherapeutic INR/Large abdominal/thigh/groin bruise. 3 gr HB drop since admission. Received 10 mg vitamin K IV today. Coumadin on hold. CT scan ordered to r/o retroperitoneal bleed. Hb appears to be overall stable. Recheck Hb in am. Follow CT results.     Code Status: Full  Family Communication: None  Disposition Plan: Home when ready.   Procedures  None   Consults  GI   Medications  Scheduled Meds: . atorvastatin  40 mg Oral QHS  . benzonatate  100 mg Oral TID AC  . levalbuterol  0.63 mg Nebulization Q6H  . levofloxacin (LEVAQUIN) IV  750 mg Intravenous Q48H  . metoprolol succinate  25 mg Oral Daily  . ondansetron (ZOFRAN) IV  4 mg Intravenous TID AC  . pantoprazole  40 mg Oral BID AC  . phytonadione (VITAMIN K) IV  10 mg Intravenous Daily   Continuous Infusions:   PRN Meds:.albuterol, ALPRAZolam, guaiFENesin-dextromethorphan, ondansetron (ZOFRAN) IV  DVT Prophylaxis  Couamdin - SCDs    Lab Results  Component Value Date   PLT 266 03/14/2014  Antibiotics   Anti-infectives    Start     Dose/Rate Route Frequency Ordered Stop   03/13/14 1600  levofloxacin (LEVAQUIN) IVPB 750 mg     750 mg 100 mL/hr over 90 Minutes Intravenous Every 48 hours 03/13/14 1331     03/13/14 1000  azithromycin (ZITHROMAX) tablet 500 mg  Status:  Discontinued     500 mg Oral Daily 03/12/14 1029 03/13/14 1334   03/12/14 0600  azithromycin (ZITHROMAX) 500 mg in dextrose 5 % 250 mL IVPB  Status:  Discontinued     500 mg 250 mL/hr over 60 Minutes Intravenous Every 24 hours 03/11/14  0536 03/12/14 1029   03/12/14 0000  cefTRIAXone (ROCEPHIN) 1 g in dextrose 5 % 50 mL IVPB - Premix  Status:  Discontinued     1 g 100 mL/hr over 30 Minutes Intravenous Every 24 hours 03/11/14 0537 03/11/14 0720   03/11/14 0900  ampicillin-sulbactam (UNASYN) 1.5 g in sodium chloride 0.9 % 50 mL IVPB  Status:  Discontinued     1.5 g 100 mL/hr over 30 Minutes Intravenous Every 6 hours 03/11/14 0720 03/13/14 1331   03/11/14 0545  azithromycin (ZITHROMAX) 500 mg in dextrose 5 % 250 mL IVPB  Status:  Discontinued     500 mg 250 mL/hr over 60 Minutes Intravenous Every 24 hours 03/11/14 0537 03/11/14 0538   03/11/14 0330  azithromycin (ZITHROMAX) 500 mg in dextrose 5 % 250 mL IVPB  Status:  Discontinued     500 mg 250 mL/hr over 60 Minutes Intravenous Every 24 hours 03/11/14 0318 03/11/14 0535   03/11/14 0300  cefTRIAXone (ROCEPHIN) injection 1 g     1 g Intramuscular  Once 03/11/14 0247 03/11/14 0256   03/11/14 0300  azithromycin (ZITHROMAX) tablet 500 mg     500 mg Oral  Once 03/11/14 0254 03/11/14 0302          Objective:   Filed Vitals:   03/13/14 2049 03/14/14 0519 03/14/14 0533 03/14/14 0802  BP: 116/53  135/78   Pulse: 108  110 116  Temp: 98.9 F (37.2 C)  98.9 F (37.2 C)   TempSrc: Oral  Oral   Resp: 20  20 18   Height:      Weight:      SpO2: 97% 93% 93% 96%    Wt Readings from Last 3 Encounters:  03/11/14 56.972 kg (125 lb 9.6 oz)  03/02/14 58.514 kg (129 lb)  09/17/12 56.7 kg (125 lb)     Intake/Output Summary (Last 24 hours) at 03/14/14 1310 Last data filed at 03/14/14 0909  Gross per 24 hour  Intake    770 ml  Output    950 ml  Net   -180 ml     Physical Exam  Awake Alert, Oriented X 3, No new F.N deficits, Normal affect [place].AT,PERRAL Supple Neck,No JVD, No cervical lymphadenopathy appriciated.  Symmetrical Chest wall movement, Good air movement bilaterally, CTAB iRRR,No Gallops,Rubs or new Murmurs, No Parasternal Heave +ve B.Sounds, Abd Soft, No  tenderness, No organomegaly appriciated, No rebound - guarding or rigidity. No Cyanosis, Clubbing or edema, No new Rash , abdominal wall is bruised   Data Review   Micro Results Recent Results (from the past 240 hour(s))  Culture, blood (routine x 2) Call MD if unable to obtain prior to antibiotics being given     Status: None (Preliminary result)   Collection Time: 03/11/14  5:47 AM  Result Value Ref Range Status   Specimen Description BLOOD RIGHT  ARM  Final   Special Requests BOTTLES DRAWN AEROBIC AND ANAEROBIC 5CC  Final   Culture NO GROWTH 3 DAYS  Final   Report Status PENDING  Incomplete  Culture, blood (routine x 2) Call MD if unable to obtain prior to antibiotics being given     Status: None (Preliminary result)   Collection Time: 03/11/14  5:47 AM  Result Value Ref Range Status   Specimen Description BLOOD RIGHT ARM  Final   Special Requests BOTTLES DRAWN AEROBIC AND ANAEROBIC 5CC  Final   Culture NO GROWTH 3 DAYS  Final   Report Status PENDING  Incomplete    Radiology Reports Dg Chest 2 View  03/13/2014   CLINICAL DATA:  Community acquired pneumonia. COPD. Atrial fibrillation.  EXAM: CHEST  2 VIEW  COMPARISON:  03/11/2014  FINDINGS: Patient is partially rotated to the left. Cardiomegaly remains stable. Streaky opacity persists along the right heart border, without significant change. Right middle lobe infiltrate cannot be excluded. Left lung remains clear. Pulmonary hyperinflation again seen, consistent with COPD. No evidence of pleural effusion or pulmonary edema. Thoracic dextroscoliosis again noted.  IMPRESSION: No significant change in streaky opacity along right heart border, suspicious for mild right middle lobe infiltrate.  Stable cardiomegaly and COPD.   Electronically Signed   By: Earle Gell M.D.   On: 03/13/2014 12:31   Dg Chest 2 View  03/11/2014   CLINICAL DATA:  Acute onset of productive cough and nausea. Initial encounter.  EXAM: CHEST  2 VIEW  COMPARISON:  Chest  radiograph performed 12/05/2012  FINDINGS: The lungs are hyperexpanded, with flattening of the hemidiaphragms, compatible with COPD. Mild right middle lobe airspace opacity is suggested. There is no evidence of pleural effusion or pneumothorax. Mild peribronchial thickening is noted.  The heart is borderline normal in size; the mediastinal contour is within normal limits. No acute osseous abnormalities are seen. Right convex thoracic scoliosis is noted.  IMPRESSION: 1. Suggestion of mild right middle lobe airspace opacity, which may reflect mild pneumonia. 2. Findings of COPD. 3. Right convex thoracic scoliosis noted.   Electronically Signed   By: Garald Balding M.D.   On: 03/11/2014 02:38     CBC  Recent Labs Lab 03/11/14 0340 03/11/14 0416 03/12/14 0543 03/13/14 0614 03/14/14 0608  WBC  --  13.6* 13.0* 14.8* 13.0*  HGB 12.6 11.1* 9.7* 8.9* 8.2*  HCT 37.0 33.0* 28.8* 26.5* 24.3*  PLT  --  289 320 302 266  MCV  --  90.4 90.3 90.8 91.4  MCH  --  30.4 30.4 30.5 30.8  MCHC  --  33.6 33.7 33.6 33.7  RDW  --  13.1 13.3 13.4 13.4  LYMPHSABS  --  1.0 1.7  --   --   MONOABS  --  1.4* 1.3*  --   --   EOSABS  --  0.0 0.1  --   --   BASOSABS  --  0.0 0.0  --   --     Chemistries   Recent Labs Lab 03/11/14 0340 03/12/14 0543 03/14/14 0608  NA 129* 134* 133*  K 3.6 3.6 3.7  CL 90* 101 98  CO2  --  27 29  GLUCOSE 151* 110* 106*  BUN 9 5* 8  CREATININE 0.80 0.66 0.79  CALCIUM  --  8.0* 7.9*   ------------------------------------------------------------------------------------------------------------------ estimated creatinine clearance is 42 mL/min (by C-G formula based on Cr of 0.79). ------------------------------------------------------------------------------------------------------------------ No results for input(s): HGBA1C in the last 72 hours. ------------------------------------------------------------------------------------------------------------------  No results for  input(s): CHOL, HDL, LDLCALC, TRIG, CHOLHDL, LDLDIRECT in the last 72 hours. ------------------------------------------------------------------------------------------------------------------ No results for input(s): TSH, T4TOTAL, T3FREE, THYROIDAB in the last 72 hours.  Invalid input(s): FREET3 ------------------------------------------------------------------------------------------------------------------ No results for input(s): VITAMINB12, FOLATE, FERRITIN, TIBC, IRON, RETICCTPCT in the last 72 hours.  Coagulation profile  Recent Labs Lab 03/11/14 1022 03/11/14 1942 03/12/14 0543 03/13/14 0614 03/14/14 0608  INR 9.12* 6.67* 3.74* 2.97* 3.24*    No results for input(s): DDIMER in the last 72 hours.  Cardiac Enzymes No results for input(s): CKMB, TROPONINI, MYOGLOBIN in the last 168 hours.  Invalid input(s): CK ------------------------------------------------------------------------------------------------------------------ Invalid input(s): POCBNP     Time Spent in minutes   25   Lelon Frohlich M.D on 03/14/2014 at 1:10 PM  Between 7am to 7pm - Pager - [phone]  After 7pm go to [email or web address] - password [facility]  [facility]   Office  [phone]

## 2014-03-14 NOTE — Progress Notes (Signed)
[place] for Coumadin Indication: atrial fibrillation  No Known Allergies  Patient Measurements: Height: 5\' 4"  (162.6 cm) Weight: 125 lb 9.6 oz (56.972 kg) IBW/kg (Calculated) : 54.7  Vital Signs: Temp: 98.9 F (37.2 C) (03/06 0533) Temp Source: Oral (03/06 0533) BP: 135/78 mmHg (03/06 0533) Pulse Rate: 116 (03/06 0802)  Labs:  Recent Labs  03/12/14 0543 03/13/14 5176 03/14/14 0608  HGB 9.7* 8.9* 8.2*  HCT 28.8* 26.5* 24.3*  PLT 320 302 266  LABPROT 37.3* 31.1* 33.3*  INR 3.74* 2.97* 3.24*  CREATININE 0.66  --  0.79   Estimated Creatinine Clearance: 42 mL/min (by C-G formula based on Cr of 0.79).  Medical History: Past Medical History  Diagnosis Date  . Atrial fibrillation 1993    permanent; onset in 1993  . Hyperthyroidism 2000    2000  . Hypertension     unspecified  . Osteoporosis   . Chronic anticoagulation    Medications:  Prescriptions prior to admission  Medication Sig Dispense Refill Last Dose  . ALPRAZolam (XANAX) 0.5 MG tablet Take 0.5 mg by mouth at bedtime as needed for anxiety or sleep.    unknown  . ascorbic Acid (VITAMIN C CR) 500 MG CPCR Take 500 mg by mouth daily.     Past Week at Unknown time  . atorvastatin (LIPITOR) 40 MG tablet Take 1 tablet (40 mg total) by mouth at bedtime. 30 tablet 6 Past Week at Unknown time  . lisinopril (PRINIVIL,ZESTRIL) 20 MG tablet Take 1 tablet (20 mg total) by mouth daily. 90 tablet 3 Past Week at Unknown time  . metoprolol succinate (TOPROL-XL) 25 MG 24 hr tablet TAKE 1 TABLET BY MOUTH EVERY DAY 30 tablet 6 Past Week at 0900  . Multiple Vitamins-Minerals (CENTRUM SILVER PO) Take 1 tablet by mouth daily.     Past Week at Unknown time  . Multiple Vitamins-Minerals (EYE VITAMINS PO) Take 2 capsules by mouth daily.   Past Week at Unknown time  . Omega-3 Fatty Acids (FISH OIL) 1000 MG CAPS Take 1 capsule by mouth daily.     Past Week at Unknown time  . warfarin (COUMADIN) 5 MG  tablet Take 1 tablet daily except 1 1/2 tablets on Mondays or as directed 45 tablet 3 Past Week at Unknown time   Assessment: 81yo female on chronic Coumadin PTA for h/o afib.  Home dose listed above.  INR was elevated on admission (>9). Vit K 2.5mg  po given 03/11/14.  INR slightly above goal range today.  MD requests no more Coumadin doses until INR<2.5.   No active bleeding reported, however abdominal wall bruising noted.   Patient on Levaquin for PNA which can interact with Coumadin to increase INR.   Goal of Therapy:  INR 2-3   Plan:   Hold Coumadin until INR<2.5 per MD  INR daily   Biagio Borg 03/14/2014,8:08 AM

## 2014-03-14 NOTE — Progress Notes (Signed)
Patient ID: Renee Cameron, female   DOB: 1924/06/26, 81 y.o.   MRN: [id number]   Assessment/Plan: ADMITTED WITH COUGH AND NAUSEA. NOW WITH FLANK/BACK/ABDOMINAL ECCHYMOSIS WITH TACHYCARDIA ON COUMADIN AND WITH INR > 2.5 AND 3 GM DROP IN Hb. NO SIGNS OR SYMPTOMS OF ACTIVE GI BLEED. NAUSEA & COUGH BETTER THAN YESTERDAY.  PLAN: 1. HOLD COUMADIN. VITAMIN K 10 MG IV x1, STAT CT ABD/PELVIS. IF RP BLEED, NEEDS FFP AND CONTINUED VITAMIN K. 2. CONTINUE TESSALON PERLES, XOPENEX, AND PRN ALBUTEROL. 3. BID PP AND ZOFRAN QAC   Subjective: Since I last evaluated the patient THE NAUSEA IS SLIGHTLY BETTER. THE COUGH IS IMPROVED. NOW HAS PRODUCTIVE COUGH. C/O BRUISING GETTING WORSE ON ABDOMEN AND ABDOMEN DISTENDED AND SORE TO TOUCH.  Objective: Vital signs in last 24 hours: Filed Vitals:   03/14/14 0802  BP:   Pulse: 116  Temp:   Resp: 18   General appearance: alert, cooperative and no distress Resp: clear to auscultation bilaterally IN ANTERIOR LUNG Rakan Soffer Cardio: irregularly irregular rhythm GI: soft, DIFFUSELY tender AND MILDLY DISTENDED; bowel sounds normal; no masses,  no organomegaly Extremities: no edema, redness or tenderness in the calves or thighs  Lab Results:  Hb 11.2 --> 8.4 PLT 266 Cr 0.79  Studies/Results: Dg Chest 2 View  03/13/2014   CLINICAL DATA:  Community acquired pneumonia. COPD. Atrial fibrillation.  EXAM: CHEST  2 VIEW  COMPARISON:  03/11/2014  FINDINGS: Patient is partially rotated to the left. Cardiomegaly remains stable. Streaky opacity persists along the right heart border, without significant change. Right middle lobe infiltrate cannot be excluded. Left lung remains clear. Pulmonary hyperinflation again seen, consistent with COPD. No evidence of pleural effusion or pulmonary edema. Thoracic dextroscoliosis again noted.  IMPRESSION: No significant change in streaky opacity along right heart border, suspicious for mild right middle lobe infiltrate.  Stable cardiomegaly  and COPD.   Electronically Signed   By: Earle Gell M.D.   On: 03/13/2014 12:31    Medications: I have reviewed the patient's current medications.   LOS: 5 days   Barney Drain 06/18/2013, 2:23 PM

## 2014-03-15 ENCOUNTER — Inpatient Hospital Stay (HOSPITAL_COMMUNITY): Payer: Medicare Other

## 2014-03-15 DIAGNOSIS — S301XXA Contusion of abdominal wall, initial encounter: Secondary | ICD-10-CM | POA: Diagnosis present

## 2014-03-15 LAB — CBC
HCT: 25.7 % — ABNORMAL LOW (ref 36.0–46.0)
HEMOGLOBIN: 8.4 g/dL — AB (ref 12.0–15.0)
MCH: 30.1 pg (ref 26.0–34.0)
MCHC: 32.7 g/dL (ref 30.0–36.0)
MCV: 92.1 fL (ref 78.0–100.0)
Platelets: 293 10*3/uL (ref 150–400)
RBC: 2.79 MIL/uL — AB (ref 3.87–5.11)
RDW: 13.5 % (ref 11.5–15.5)
WBC: 12.7 10*3/uL — ABNORMAL HIGH (ref 4.0–10.5)

## 2014-03-15 LAB — BASIC METABOLIC PANEL
ANION GAP: 8 (ref 5–15)
BUN: 7 mg/dL (ref 6–23)
CHLORIDE: 98 mmol/L (ref 96–112)
CO2: 27 mmol/L (ref 19–32)
CREATININE: 0.79 mg/dL (ref 0.50–1.10)
Calcium: 8.2 mg/dL — ABNORMAL LOW (ref 8.4–10.5)
GFR calc Af Amer: 84 mL/min — ABNORMAL LOW (ref >90)
GFR, EST NON AFRICAN AMERICAN: 72 mL/min — AB (ref >90)
Glucose, Bld: 159 mg/dL — ABNORMAL HIGH (ref 70–99)
Potassium: 3.6 mmol/L (ref 3.5–5.1)
Sodium: 133 mmol/L — ABNORMAL LOW (ref 135–145)

## 2014-03-15 LAB — PROTIME-INR
INR: 1.54 — ABNORMAL HIGH (ref 0.00–1.49)
Prothrombin Time: 18.6 seconds — ABNORMAL HIGH (ref 11.6–15.2)

## 2014-03-15 LAB — LEGIONELLA ANTIGEN, URINE

## 2014-03-15 MED ORDER — METOPROLOL TARTRATE 25 MG PO TABS
25.0000 mg | ORAL_TABLET | Freq: Two times a day (BID) | ORAL | Status: DC
  Administered 2014-03-15 – 2014-03-18 (×7): 25 mg via ORAL
  Filled 2014-03-15 (×7): qty 1

## 2014-03-15 NOTE — Progress Notes (Signed)
Patient Demographics  Renee Cameron, is a 81 y.o. female, DOB - January 19, 1924, JJH:[id number]  Admit date - 03/11/2014   Admitting Physician Phillips Grout, MD  Outpatient Primary MD for the patient is Delphina Cahill, MD  LOS - 4   Chief Complaint  Patient presents with  . Nausea  . Cough      Summary  This is a pleasant 81 year old female with history of permanent atrial fibrillation on Coumadin, hypothyroidism, central hypertension was been having chronic nausea for the last 10 days, subsequently developed some vomiting and diarrhea 4 days ago, she aspirated some of her vomitus and I will up the cough and shortness of breath. Came to the ER where she was diagnosed with aspiration pneumonia. She has been placed on appropriate IV antibiotics with improvement in her pneumonia symptoms, she however continues to have persistent nausea.  Upon admission her INR was noted to be supratherapeutic close to 10, she received vitamin K and INR has come down to 2.6, she has developed large bruising in her abdominal wall. she displays no evidence of GI bleed, however she continues to have intense nausea. Today (3/6), given her worsening bruising, GI has ordered a CT scan to r/o a RP bleed, given 10 mg of vitamin K and placed coumadin on hold for now.   Subjective:   Renee Cameron today has, No headache, No chest pain, No abdominal pain - No Nausea, No new weakness tingling or numbness, No Cough - SOB.  Nausea is improving and she wants to advance diet. Abdominal bruising is progressing.  Assessment & Plan    1. CAP. Patient is symptomatically improved. GI had transitioned over to Levaquin  2. Gastroenteritis with nausea vomiting. Improved with taking off azithromycin for her PNA. Continue with symptomatic management.  Advanced diet as tolerated.   3. Dehydration with hyponatremia . Improving with IV fluids.      4. Permanent atrial fibrillation. Heart rate appears to be elevated. Toprol XL changed to lopressor bid for easier titration, anticoagulation discontinued and coumadin reversed    5. Dyslipidemia. Continue statin.    6. Rectus sheath hematomas. Patient was found to have elevated INR on admission at 9. Coumadin was held on admission and she received a dose of vit K. She did not have any trauma/injections to that area. She did have significant coughing and possible vomiting prior to admission. Although even having frequent abdominal contractions with coughing would be unlikely to cause bleeding. Hemoglobin appears to be stable. Would not restart anticoagulation at this time. Continue supportive treatment.  7. Acute blood loss anemia. Related to #6. Hemoglobin appears to be stable and she has not required a transfusion. Continue to follow.     Code Status: Full  Family Communication: Updated daughter over the phone  Disposition Plan: Home when ready.   Procedures  None   Consults  GI   Medications  Scheduled Meds: . atorvastatin  40 mg Oral QHS  . benzonatate  100 mg Oral TID AC  . levalbuterol  0.63 mg Nebulization Q6H  . levofloxacin (LEVAQUIN) IV  750 mg Intravenous Q48H  . metoprolol tartrate  25 mg Oral BID  . ondansetron (ZOFRAN) IV  4 mg Intravenous TID AC  . pantoprazole  40 mg Oral BID AC   Continuous Infusions:   PRN Meds:.albuterol, ALPRAZolam, guaiFENesin-dextromethorphan, ondansetron (ZOFRAN) IV  DVT Prophylaxis  SCDs    Lab Results  Component Value Date   PLT 293 03/15/2014    Antibiotics   Anti-infectives    Start     Dose/Rate Route Frequency Ordered Stop   03/13/14 1600  levofloxacin (LEVAQUIN) IVPB 750 mg     750 mg 100 mL/hr over 90 Minutes Intravenous Every 48 hours 03/13/14 1331     03/13/14 1000  azithromycin (ZITHROMAX) tablet 500 mg   Status:  Discontinued     500 mg Oral Daily 03/12/14 1029 03/13/14 1334   03/12/14 0600  azithromycin (ZITHROMAX) 500 mg in dextrose 5 % 250 mL IVPB  Status:  Discontinued     500 mg 250 mL/hr over 60 Minutes Intravenous Every 24 hours 03/11/14 0536 03/12/14 1029   03/12/14 0000  cefTRIAXone (ROCEPHIN) 1 g in dextrose 5 % 50 mL IVPB - Premix  Status:  Discontinued     1 g 100 mL/hr over 30 Minutes Intravenous Every 24 hours 03/11/14 0537 03/11/14 0720   03/11/14 0900  ampicillin-sulbactam (UNASYN) 1.5 g in sodium chloride 0.9 % 50 mL IVPB  Status:  Discontinued     1.5 g 100 mL/hr over 30 Minutes Intravenous Every 6 hours 03/11/14 0720 03/13/14 1331   03/11/14 0545  azithromycin (ZITHROMAX) 500 mg in dextrose 5 % 250 mL IVPB  Status:  Discontinued     500 mg 250 mL/hr over 60 Minutes Intravenous Every 24 hours 03/11/14 0537 03/11/14 0538   03/11/14 0330  azithromycin (ZITHROMAX) 500 mg in dextrose 5 % 250 mL IVPB  Status:  Discontinued     500 mg 250 mL/hr over 60 Minutes Intravenous Every 24 hours 03/11/14 0318 03/11/14 0535   03/11/14 0300  cefTRIAXone (ROCEPHIN) injection 1 g     1 g Intramuscular  Once 03/11/14 0247 03/11/14 0256   03/11/14 0300  azithromycin (ZITHROMAX) tablet 500 mg     500 mg Oral  Once 03/11/14 0254 03/11/14 0302          Objective:   Filed Vitals:   03/15/14 0624 03/15/14 0726 03/15/14 1343 03/15/14 1500  BP: 143/71  140/78   Pulse: 107  116   Temp: 98.8 F (37.1 C)  97.8 F (36.6 C)   TempSrc: Oral  Oral   Resp: 17  17   Height:      Weight:      SpO2: 96% 96% 99% 96%    Wt Readings from Last 3 Encounters:  03/11/14 56.972 kg (125 lb 9.6 oz)  03/02/14 58.514 kg (129 lb)  09/17/12 56.7 kg (125 lb)     Intake/Output Summary (Last 24 hours) at 03/15/14 1859 Last data filed at 03/15/14 1803  Gross per 24 hour  Intake    120 ml  Output   1500 ml  Net  -1380 ml     Physical Exam  Awake Alert, Oriented X 3, No new F.N deficits, Normal  affect [name].AT,PERRAL Supple Neck,No JVD, No cervical lymphadenopathy appriciated.  Symmetrical Chest wall movement, Good air movement bilaterally, CTAB iRRR,No Gallops,Rubs or new Murmurs, No Parasternal Heave +ve B.Sounds, Abd Soft, No tenderness, No organomegaly appriciated, No rebound - guarding or rigidity. No Cyanosis, Clubbing or edema, No new Rash , extensive ecchymosis over abdomen/groin   Data Review   Micro Results Recent Results (from the past 240 hour(s))  Culture, blood (routine x 2) Call  MD if unable to obtain prior to antibiotics being given     Status: None (Preliminary result)   Collection Time: 03/11/14  5:47 AM  Result Value Ref Range Status   Specimen Description BLOOD RIGHT ARM  Final   Special Requests BOTTLES DRAWN AEROBIC AND ANAEROBIC 5CC  Final   Culture NO GROWTH 4 DAYS  Final   Report Status PENDING  Incomplete  Culture, blood (routine x 2) Call MD if unable to obtain prior to antibiotics being given     Status: None (Preliminary result)   Collection Time: 03/11/14  5:47 AM  Result Value Ref Range Status   Specimen Description BLOOD RIGHT ARM  Final   Special Requests BOTTLES DRAWN AEROBIC AND ANAEROBIC 5CC  Final   Culture NO GROWTH 4 DAYS  Final   Report Status PENDING  Incomplete    Radiology Reports Ct Abdomen Pelvis Wo Contrast  03/14/2014   CLINICAL DATA:  Abdominal pain and decreased hemoglobin. Evaluate for retroperitoneal bleed. On blood thinners. Appendectomy and hysterectomy.  EXAM: CT ABDOMEN AND PELVIS WITHOUT CONTRAST  TECHNIQUE: Multidetector CT imaging of the abdomen and pelvis was performed following the standard protocol without IV contrast.  COMPARISON:  Plain films 12/05/2012 and abdominal ultrasound of 01/22/2008.  FINDINGS: Lower chest: Bibasilar scarring. Mild cardiomegaly. Bilateral pleural thickening.  Hepatobiliary: Mild motion degradation throughout. Normal noncontrast appearance of the liver. Grossly normal gallbladder, without  biliary ductal dilatation.  Pancreas: Normal pancreas for age, without duct dilatation.  Spleen: Normal  Adrenals/Urinary Tract: Normal adrenal glands. Left renal cortical thinning. No renal calculi or hydronephrosis. No bladder calculi.  Stomach/Bowel: Normal stomach, without wall thickening. Fluid filled colon, suggesting a diarrheal state. Normal terminal ileum. Normal small bowel.  Vascular/Lymphatic: Aortic and branch vessel atherosclerosis. No abdominopelvic adenopathy.  Reproductive: Normal uterus and adnexa.  Other: No significant free fluid. No evidence of retroperitoneal hematoma. Small bilateral fat containing inguinal hernias.  Bilateral rectus sheath hematomas. Right measures 7.5 x 6.3 cm. The left measures 3.7 x 4.3 cm.  Soft tissue fullness about the low left pectoral musculature on image 13 may represent minimal intramuscular hematoma or be positional.  Musculoskeletal: Osteopenia. Convex left lumbar spine curvature. Grade 1 L5-S1 anterolisthesis.  IMPRESSION: 1. Bilateral, right larger than left rectus sheath hematomas. These results will be called to the ordering clinician or representative by the Radiologist Assistant, and communication documented in the PACS or zVision Dashboard. 2. No retroperitoneal hemorrhage identified. 3. Degraded exam secondary to motion and lack of oral/IV contrast. 4. Mild soft tissue fullness about the left low pectoral musculature which could be positional or represent a separate area of intramuscular hematoma. Consider physical exam correlation.   Electronically Signed   By: Abigail Miyamoto M.D.   On: 03/14/2014 13:27   Dg Chest 2 View  03/13/2014   CLINICAL DATA:  Community acquired pneumonia. COPD. Atrial fibrillation.  EXAM: CHEST  2 VIEW  COMPARISON:  03/11/2014  FINDINGS: Patient is partially rotated to the left. Cardiomegaly remains stable. Streaky opacity persists along the right heart border, without significant change. Right middle lobe infiltrate cannot be  excluded. Left lung remains clear. Pulmonary hyperinflation again seen, consistent with COPD. No evidence of pleural effusion or pulmonary edema. Thoracic dextroscoliosis again noted.  IMPRESSION: No significant change in streaky opacity along right heart border, suspicious for mild right middle lobe infiltrate.  Stable cardiomegaly and COPD.   Electronically Signed   By: Earle Gell M.D.   On: 03/13/2014 12:31   Dg  Chest 2 View  03/11/2014   CLINICAL DATA:  Acute onset of productive cough and nausea. Initial encounter.  EXAM: CHEST  2 VIEW  COMPARISON:  Chest radiograph performed 12/05/2012  FINDINGS: The lungs are hyperexpanded, with flattening of the hemidiaphragms, compatible with COPD. Mild right middle lobe airspace opacity is suggested. There is no evidence of pleural effusion or pneumothorax. Mild peribronchial thickening is noted.  The heart is borderline normal in size; the mediastinal contour is within normal limits. No acute osseous abnormalities are seen. Right convex thoracic scoliosis is noted.  IMPRESSION: 1. Suggestion of mild right middle lobe airspace opacity, which may reflect mild pneumonia. 2. Findings of COPD. 3. Right convex thoracic scoliosis noted.   Electronically Signed   By: Garald Balding M.D.   On: 03/11/2014 02:38   US Venous Img Upper Uni Right  03/15/2014   CLINICAL DATA:  Right upper extremity swelling for 1 day. Post recent IV placement.  EXAM: RIGHT UPPER EXTREMITY VENOUS DOPPLER ULTRASOUND  TECHNIQUE: Gray-scale sonography with graded compression, as well as color Doppler and duplex ultrasound were performed to evaluate the upper extremity deep venous system from the level of the subclavian vein and including the jugular, axillary, basilic, radial, ulnar and upper cephalic vein. Spectral Doppler was utilized to evaluate flow at rest and with distal augmentation maneuvers.  COMPARISON:  None.  FINDINGS: Contralateral Subclavian Vein: Respiratory phasicity is normal and  symmetric with the symptomatic side. No evidence of thrombus. Normal compressibility.  Internal Jugular Vein: No evidence of thrombus. Normal compressibility, respiratory phasicity and response to augmentation.  Subclavian Vein: No evidence of thrombus. Normal compressibility, respiratory phasicity and response to augmentation.  Axillary Vein: No evidence of thrombus. Normal compressibility, respiratory phasicity and response to augmentation.  Cephalic Vein: There is echogenic occlusive thrombus within the cephalic vein at the level of the elbow (images 43 and 44). The cephalic vein appears patent at the level of shoulder / cephalic arch. There is no extension of this superficial thrombophlebitis to the deep venous system of the right upper extremity.  Basilic Vein: No evidence of thrombus. Normal compressibility, respiratory phasicity and response to augmentation.  Brachial Veins: No evidence of thrombus. Normal compressibility, respiratory phasicity and response to augmentation.  Radial Veins: No evidence of thrombus. Normal compressibility, respiratory phasicity and response to augmentation.  Ulnar Veins: No evidence of thrombus. Normal compressibility, respiratory phasicity and response to augmentation.  Venous Reflux:  None visualized.  Other Findings:  None visualized.  IMPRESSION: 1. No evidence of DVT within the right upper extremity. 2. Occlusive superficial thrombophlebitis involving the antecubital aspect of the cephalic vein. Again, there is no extension of this superficial thrombophlebitis to the deep venous system of the right upper extremity.   Electronically Signed   By: Sandi Mariscal M.D.   On: 03/15/2014 18:01     CBC  Recent Labs Lab 03/11/14 0416 03/12/14 0543 03/13/14 0614 03/14/14 0608 03/15/14 0907  WBC 13.6* 13.0* 14.8* 13.0* 12.7*  HGB 11.1* 9.7* 8.9* 8.2* 8.4*  HCT 33.0* 28.8* 26.5* 24.3* 25.7*  PLT 289 320 302 266 293  MCV 90.4 90.3 90.8 91.4 92.1  MCH 30.4 30.4 30.5 30.8  30.1  MCHC 33.6 33.7 33.6 33.7 32.7  RDW 13.1 13.3 13.4 13.4 13.5  LYMPHSABS 1.0 1.7  --   --   --   MONOABS 1.4* 1.3*  --   --   --   EOSABS 0.0 0.1  --   --   --  BASOSABS 0.0 0.0  --   --   --     Chemistries   Recent Labs Lab 03/11/14 0340 03/12/14 0543 03/14/14 0608 03/15/14 0907  NA 129* 134* 133* 133*  K 3.6 3.6 3.7 3.6  CL 90* 101 98 98  CO2  --  27 29 27   GLUCOSE 151* 110* 106* 159*  BUN 9 5* 8 7  CREATININE 0.80 0.66 0.79 0.79  CALCIUM  --  8.0* 7.9* 8.2*   ------------------------------------------------------------------------------------------------------------------ estimated creatinine clearance is 42 mL/min (by C-G formula based on Cr of 0.79). ------------------------------------------------------------------------------------------------------------------ No results for input(s): HGBA1C in the last 72 hours. ------------------------------------------------------------------------------------------------------------------ No results for input(s): CHOL, HDL, LDLCALC, TRIG, CHOLHDL, LDLDIRECT in the last 72 hours. ------------------------------------------------------------------------------------------------------------------ No results for input(s): TSH, T4TOTAL, T3FREE, THYROIDAB in the last 72 hours.  Invalid input(s): FREET3 ------------------------------------------------------------------------------------------------------------------ No results for input(s): VITAMINB12, FOLATE, FERRITIN, TIBC, IRON, RETICCTPCT in the last 72 hours.  Coagulation profile  Recent Labs Lab 03/11/14 1942 03/12/14 0543 03/13/14 0614 03/14/14 0608 03/15/14 0546  INR 6.67* 3.74* 2.97* 3.24* 1.54*    No results for input(s): DDIMER in the last 72 hours.  Cardiac Enzymes No results for input(s): CKMB, TROPONINI, MYOGLOBIN in the last 168 hours.  Invalid input(s):  CK ------------------------------------------------------------------------------------------------------------------ Invalid input(s): POCBNP     Time Spent in minutes   25   Taysean Wager M.D on 03/15/2014 at 6:59 PM  Between 7am to 7pm - Pager - [phone]  After 7pm go to [email or web address] - password [facility]  [facility]   Office  [phone]

## 2014-03-15 NOTE — Progress Notes (Signed)
Subjective: Nausea better. No vomiting. Ate some tomato soup yesterday evening. Would like to try to advance diet. Still with decreased oral intake with full liquids but feels she may like to try advancing. 1 runny/soft BM this morning.   Objective: Vital signs in last 24 hours: Temp:  [98.5 F (36.9 C)-98.8 F (37.1 C)] 98.8 F (37.1 C) (03/07 0624) Pulse Rate:  [87-107] 107 (03/07 0624) Resp:  [16-18] 17 (03/07 0624) BP: (109-143)/(59-71) 143/71 mmHg (03/07 0624) SpO2:  [91 %-98 %] 96 % (03/07 0726) Last BM Date: 03/12/14 General:   Alert and oriented, pleasant Head:  Normocephalic and atraumatic. Eyes:  No icterus, sclera clear. Conjuctiva pink.  Abdomen:  Bowel sounds present, distended but soft. Ecchymosis noted across abdomen, tender with palpation. Extremities:  Without  edema. Psych:  Alert and cooperative. Normal mood and affect.  Intake/Output from previous day: 03/06 0701 - 03/07 0700 In: 990 [P.O.:940; IV Piggyback:50] Out: 2355 [Urine:1750] Intake/Output this shift:    Lab Results:  Recent Labs  03/13/14 0614 03/14/14 0608  WBC 14.8* 13.0*  HGB 8.9* 8.2*  HCT 26.5* 24.3*  PLT 302 266   BMET  Recent Labs  03/14/14 0608  NA 133*  K 3.7  CL 98  CO2 29  GLUCOSE 106*  BUN 8  CREATININE 0.79  CALCIUM 7.9*   PT/INR  Recent Labs  03/14/14 0608 03/15/14 0546  LABPROT 33.3* 18.6*  INR 3.24* 1.54*     Studies/Results: Ct Abdomen Pelvis Wo Contrast  03/14/2014   CLINICAL DATA:  Abdominal pain and decreased hemoglobin. Evaluate for retroperitoneal bleed. On blood thinners. Appendectomy and hysterectomy.  EXAM: CT ABDOMEN AND PELVIS WITHOUT CONTRAST  TECHNIQUE: Multidetector CT imaging of the abdomen and pelvis was performed following the standard protocol without IV contrast.  COMPARISON:  Plain films 12/05/2012 and abdominal ultrasound of 01/22/2008.  FINDINGS: Lower chest: Bibasilar scarring. Mild cardiomegaly. Bilateral pleural thickening.   Hepatobiliary: Mild motion degradation throughout. Normal noncontrast appearance of the liver. Grossly normal gallbladder, without biliary ductal dilatation.  Pancreas: Normal pancreas for age, without duct dilatation.  Spleen: Normal  Adrenals/Urinary Tract: Normal adrenal glands. Left renal cortical thinning. No renal calculi or hydronephrosis. No bladder calculi.  Stomach/Bowel: Normal stomach, without wall thickening. Fluid filled colon, suggesting a diarrheal state. Normal terminal ileum. Normal small bowel.  Vascular/Lymphatic: Aortic and branch vessel atherosclerosis. No abdominopelvic adenopathy.  Reproductive: Normal uterus and adnexa.  Other: No significant free fluid. No evidence of retroperitoneal hematoma. Small bilateral fat containing inguinal hernias.  Bilateral rectus sheath hematomas. Right measures 7.5 x 6.3 cm. The left measures 3.7 x 4.3 cm.  Soft tissue fullness about the low left pectoral musculature on image 13 may represent minimal intramuscular hematoma or be positional.  Musculoskeletal: Osteopenia. Convex left lumbar spine curvature. Grade 1 L5-S1 anterolisthesis.  IMPRESSION: 1. Bilateral, right larger than left rectus sheath hematomas. These results will be called to the ordering clinician or representative by the Radiologist Assistant, and communication documented in the PACS or zVision Dashboard. 2. No retroperitoneal hemorrhage identified. 3. Degraded exam secondary to motion and lack of oral/IV contrast. 4. Mild soft tissue fullness about the left low pectoral musculature which could be positional or represent a separate area of intramuscular hematoma. Consider physical exam correlation.   Electronically Signed   By: Abigail Miyamoto M.D.   On: 03/14/2014 13:27   Dg Chest 2 View  03/13/2014   CLINICAL DATA:  Community acquired pneumonia. COPD. Atrial fibrillation.  EXAM: CHEST  2 VIEW  COMPARISON:  03/11/2014  FINDINGS: Patient is partially rotated to the left. Cardiomegaly remains  stable. Streaky opacity persists along the right heart border, without significant change. Right middle lobe infiltrate cannot be excluded. Left lung remains clear. Pulmonary hyperinflation again seen, consistent with COPD. No evidence of pleural effusion or pulmonary edema. Thoracic dextroscoliosis again noted.  IMPRESSION: No significant change in streaky opacity along right heart border, suspicious for mild right middle lobe infiltrate.  Stable cardiomegaly and COPD.   Electronically Signed   By: Earle Gell M.D.   On: 03/13/2014 12:31    Assessment: 81 year old female admitted with aspiration pneumonia, supratherapeutic INR, N/V, 3 gram drop in Hgb without overt signs of GI bleeding. CT abd/pelvis without contrast without evidence of retroperitoneal bleed; however, it does note bilateral rectus sheath hematomas. Nausea improved with change in antibiotics, with patient desiring possible advancement of diet today. Would consider EGD if not able to advance diet or persistent nausea.   Plan: CBC, BMP today  Continue BID PPI Zofran with meals  Full liquids, advance as tolerated to low residue diet Consider EGD if unable to advance diet   Orvil Feil, ANP-BC [facility]      LOS: 4 days    03/15/2014, 8:28 AM

## 2014-03-15 NOTE — Progress Notes (Signed)
   03/15/14 1016  Mobility  Activity Ambulate in hall  Level of Assistance Contact guard assist, steadying assist  Assistive Device None  Distance Ambulated (ft) 75 ft  Ambulation Response Tolerated well

## 2014-03-15 NOTE — Care Management Utilization Note (Signed)
UR completed 

## 2014-03-16 DIAGNOSIS — D62 Acute posthemorrhagic anemia: Secondary | ICD-10-CM

## 2014-03-16 DIAGNOSIS — S301XXD Contusion of abdominal wall, subsequent encounter: Secondary | ICD-10-CM

## 2014-03-16 LAB — CULTURE, BLOOD (ROUTINE X 2)
CULTURE: NO GROWTH
Culture: NO GROWTH

## 2014-03-16 LAB — CBC
HCT: 24.1 % — ABNORMAL LOW (ref 36.0–46.0)
HEMOGLOBIN: 7.8 g/dL — AB (ref 12.0–15.0)
MCH: 30 pg (ref 26.0–34.0)
MCHC: 32.4 g/dL (ref 30.0–36.0)
MCV: 92.7 fL (ref 78.0–100.0)
PLATELETS: 313 10*3/uL (ref 150–400)
RBC: 2.6 MIL/uL — ABNORMAL LOW (ref 3.87–5.11)
RDW: 13.8 % (ref 11.5–15.5)
WBC: 10.8 10*3/uL — ABNORMAL HIGH (ref 4.0–10.5)

## 2014-03-16 LAB — PROTIME-INR
INR: 1.37 (ref 0.00–1.49)
PROTHROMBIN TIME: 17 s — AB (ref 11.6–15.2)

## 2014-03-16 LAB — ABO/RH: ABO/RH(D): O POS

## 2014-03-16 LAB — PREPARE RBC (CROSSMATCH)

## 2014-03-16 MED ORDER — SODIUM CHLORIDE 0.9 % IV SOLN
Freq: Once | INTRAVENOUS | Status: AC
  Administered 2014-03-16: 18:00:00 via INTRAVENOUS

## 2014-03-16 MED ORDER — ONDANSETRON 4 MG PO TBDP
4.0000 mg | ORAL_TABLET | Freq: Three times a day (TID) | ORAL | Status: DC
  Administered 2014-03-16 – 2014-03-18 (×7): 4 mg via ORAL
  Filled 2014-03-16 (×7): qty 1

## 2014-03-16 MED ORDER — GUAIFENESIN ER 600 MG PO TB12
1200.0000 mg | ORAL_TABLET | Freq: Two times a day (BID) | ORAL | Status: DC
  Administered 2014-03-16 – 2014-03-18 (×5): 1200 mg via ORAL
  Filled 2014-03-16 (×5): qty 2

## 2014-03-16 MED ORDER — LEVALBUTEROL HCL 0.63 MG/3ML IN NEBU: 0.6300 mg | INHALATION_SOLUTION | Freq: Four times a day (QID) | RESPIRATORY_TRACT | Status: DC | PRN

## 2014-03-16 NOTE — Progress Notes (Signed)
Patient Demographics  Renee Cameron, is a 81 y.o. female, DOB - [birth year], UDJ:[id number]  Admit date - 03/11/2014   Admitting Physician Phillips Grout, MD  Outpatient Primary MD for the patient is Delphina Cahill, MD  LOS - 5   Chief Complaint  Patient presents with  . Nausea  . Cough      Summary  This is a pleasant 81 year old female with history of permanent atrial fibrillation on Coumadin, hypothyroidism, central hypertension was been having chronic nausea for the last 10 days, subsequently developed some vomiting and diarrhea 4 days ago, she aspirated some of her vomitus and I will up the cough and shortness of breath. Came to the ER where she was diagnosed with aspiration pneumonia. She has been placed on appropriate IV antibiotics with improvement in her pneumonia symptoms, she however continues to have persistent nausea.  Upon admission her INR was noted to be supratherapeutic close to 10, she received vitamin K and INR has come down to 2.6, she has developed large bruising in her abdominal wall. she displays no evidence of GI bleed, however she continues to have intense nausea. Today (3/6), given her worsening bruising, GI has ordered a CT scan to r/o a RP bleed, given 10 mg of vitamin K and placed coumadin on hold for now.   Subjective:   Renee Cameron feels the nausea and vomiting is improving. She is able to tolerate more solid food. She continues to have a cough. She does complain of frequent loose stools have started today.  Assessment & Plan    1. CAP. Patient is symptomatically improved. GI had transitioned over to Levaquin, discontinue antibiotics after tomorrows dose.  2. Gastroenteritis with nausea vomiting. Improved with taking off azithromycin for her PNA. Continue with  symptomatic management. Advanced diet as tolerated. Patient is now having diarrhea. Stool studies have been ordered.   3. Dehydration with hyponatremia . Improving with IV fluids.    4. Permanent atrial fibrillation. Heart rate appears to be elevated. Toprol XL changed to lopressor bid for easier titration, anticoagulation discontinued and coumadin reversed  5. Dyslipidemia. Continue statin.  6. Rectus sheath hematomas. Patient was found to have elevated INR on admission at 9. Coumadin was held on admission and she received a dose of vit K. She did not have any trauma/injections to her abdomen She did have significant coughing and possible vomiting prior to admission. Although even having frequent abdominal contractions with coughing would be unlikely to cause bleeding. Hemoglobin appears to be stable. Would not restart anticoagulation at this time. Continue supportive treatment.  7. Acute blood loss anemia. Related to #6. Hemoglobin has trended down from yesterday. We'll plan on transfusion of 1 unit of PRBC. Continue to follow.  Code Status: Full  Family Communication: Updated daughter over the phone  Disposition Plan: Home when ready.   Procedures  None   Consults  GI   Medications  Scheduled Meds: . sodium chloride   Intravenous Once  . atorvastatin  40 mg Oral QHS  . benzonatate  100 mg Oral TID AC  . guaiFENesin  1,200 mg Oral BID  . levofloxacin (LEVAQUIN) IV  750 mg Intravenous Q48H  . metoprolol tartrate  25 mg Oral BID  . ondansetron  4 mg Oral TID WC  . pantoprazole  40 mg Oral BID AC   Continuous Infusions:   PRN Meds:.albuterol, ALPRAZolam, guaiFENesin-dextromethorphan, levalbuterol, ondansetron (ZOFRAN) IV  DVT Prophylaxis  SCDs    Lab Results  Component Value Date   PLT 313 03/16/2014    Antibiotics   Anti-infectives    Start     Dose/Rate Route Frequency Ordered Stop   03/13/14 1600  levofloxacin (LEVAQUIN) IVPB 750 mg     750 mg 100 mL/hr over  90 Minutes Intravenous Every 48 hours 03/13/14 1331     03/13/14 1000  azithromycin (ZITHROMAX) tablet 500 mg  Status:  Discontinued     500 mg Oral Daily 03/12/14 1029 03/13/14 1334   03/12/14 0600  azithromycin (ZITHROMAX) 500 mg in dextrose 5 % 250 mL IVPB  Status:  Discontinued     500 mg 250 mL/hr over 60 Minutes Intravenous Every 24 hours 03/11/14 0536 03/12/14 1029   03/12/14 0000  cefTRIAXone (ROCEPHIN) 1 g in dextrose 5 % 50 mL IVPB - Premix  Status:  Discontinued     1 g 100 mL/hr over 30 Minutes Intravenous Every 24 hours 03/11/14 0537 03/11/14 0720   03/11/14 0900  ampicillin-sulbactam (UNASYN) 1.5 g in sodium chloride 0.9 % 50 mL IVPB  Status:  Discontinued     1.5 g 100 mL/hr over 30 Minutes Intravenous Every 6 hours 03/11/14 0720 03/13/14 1331   03/11/14 0545  azithromycin (ZITHROMAX) 500 mg in dextrose 5 % 250 mL IVPB  Status:  Discontinued     500 mg 250 mL/hr over 60 Minutes Intravenous Every 24 hours 03/11/14 0537 03/11/14 0538   03/11/14 0330  azithromycin (ZITHROMAX) 500 mg in dextrose 5 % 250 mL IVPB  Status:  Discontinued     500 mg 250 mL/hr over 60 Minutes Intravenous Every 24 hours 03/11/14 0318 03/11/14 0535   03/11/14 0300  cefTRIAXone (ROCEPHIN) injection 1 g     1 g Intramuscular  Once 03/11/14 0247 03/11/14 0256   03/11/14 0300  azithromycin (ZITHROMAX) tablet 500 mg     500 mg Oral  Once 03/11/14 0254 03/11/14 0302          Objective:   Filed Vitals:   03/16/14 0723 03/16/14 1036 03/16/14 1433 03/16/14 1452  BP:  127/63  122/69  Pulse:  97  74  Temp:    97.7 F (36.5 C)  TempSrc:    Oral  Resp:    14  Height:      Weight:      SpO2: 97%  97% 96%    Wt Readings from Last 3 Encounters:  03/11/14 56.972 kg (125 lb 9.6 oz)  03/02/14 58.514 kg (129 lb)  09/17/12 56.7 kg (125 lb)     Intake/Output Summary (Last 24 hours) at 03/16/14 1651 Last data filed at 03/16/14 1639  Gross per 24 hour  Intake    600 ml  Output    750 ml  Net   -150  ml     Physical Exam  Awake Alert, Oriented X 3, No new F.N deficits, Normal affect [place].AT,PERRAL Supple Neck,No JVD, No cervical lymphadenopathy appriciated.  Symmetrical Chest wall movement, Good air movement bilaterally, CTAB iRRR,No Gallops,Rubs or new Murmurs, No Parasternal Heave +ve B.Sounds, Abd Soft, No tenderness, No organomegaly appreciated, No rebound - guarding or rigidity. No Cyanosis, Clubbing or edema, No new Rash , extensive ecchymosis over abdomen/groin   Data Review   Micro Results Recent Results (from the past  240 hour(s))  Culture, blood (routine x 2) Call MD if unable to obtain prior to antibiotics being given     Status: None   Collection Time: 03/11/14  5:47 AM  Result Value Ref Range Status   Specimen Description BLOOD RIGHT ARM  Final   Special Requests BOTTLES DRAWN AEROBIC AND ANAEROBIC 5CC  Final   Culture NO GROWTH 5 DAYS  Final   Report Status 03/16/2014 FINAL  Final  Culture, blood (routine x 2) Call MD if unable to obtain prior to antibiotics being given     Status: None   Collection Time: 03/11/14  5:47 AM  Result Value Ref Range Status   Specimen Description BLOOD RIGHT ARM  Final   Special Requests BOTTLES DRAWN AEROBIC AND ANAEROBIC 5CC  Final   Culture NO GROWTH 5 DAYS  Final   Report Status 03/16/2014 FINAL  Final    Radiology Reports Ct Abdomen Pelvis Wo Contrast  03/14/2014   CLINICAL DATA:  Abdominal pain and decreased hemoglobin. Evaluate for retroperitoneal bleed. On blood thinners. Appendectomy and hysterectomy.  EXAM: CT ABDOMEN AND PELVIS WITHOUT CONTRAST  TECHNIQUE: Multidetector CT imaging of the abdomen and pelvis was performed following the standard protocol without IV contrast.  COMPARISON:  Plain films 12/05/2012 and abdominal ultrasound of 01/22/2008.  FINDINGS: Lower chest: Bibasilar scarring. Mild cardiomegaly. Bilateral pleural thickening.  Hepatobiliary: Mild motion degradation throughout. Normal noncontrast appearance of  the liver. Grossly normal gallbladder, without biliary ductal dilatation.  Pancreas: Normal pancreas for age, without duct dilatation.  Spleen: Normal  Adrenals/Urinary Tract: Normal adrenal glands. Left renal cortical thinning. No renal calculi or hydronephrosis. No bladder calculi.  Stomach/Bowel: Normal stomach, without wall thickening. Fluid filled colon, suggesting a diarrheal state. Normal terminal ileum. Normal small bowel.  Vascular/Lymphatic: Aortic and branch vessel atherosclerosis. No abdominopelvic adenopathy.  Reproductive: Normal uterus and adnexa.  Other: No significant free fluid. No evidence of retroperitoneal hematoma. Small bilateral fat containing inguinal hernias.  Bilateral rectus sheath hematomas. Right measures 7.5 x 6.3 cm. The left measures 3.7 x 4.3 cm.  Soft tissue fullness about the low left pectoral musculature on image 13 may represent minimal intramuscular hematoma or be positional.  Musculoskeletal: Osteopenia. Convex left lumbar spine curvature. Grade 1 L5-S1 anterolisthesis.  IMPRESSION: 1. Bilateral, right larger than left rectus sheath hematomas. These results will be called to the ordering clinician or representative by the Radiologist Assistant, and communication documented in the PACS or zVision Dashboard. 2. No retroperitoneal hemorrhage identified. 3. Degraded exam secondary to motion and lack of oral/IV contrast. 4. Mild soft tissue fullness about the left low pectoral musculature which could be positional or represent a separate area of intramuscular hematoma. Consider physical exam correlation.   Electronically Signed   By: Abigail Miyamoto M.D.   On: 03/14/2014 13:27   Dg Chest 2 View  03/13/2014   CLINICAL DATA:  Community acquired pneumonia. COPD. Atrial fibrillation.  EXAM: CHEST  2 VIEW  COMPARISON:  03/11/2014  FINDINGS: Patient is partially rotated to the left. Cardiomegaly remains stable. Streaky opacity persists along the right heart border, without significant  change. Right middle lobe infiltrate cannot be excluded. Left lung remains clear. Pulmonary hyperinflation again seen, consistent with COPD. No evidence of pleural effusion or pulmonary edema. Thoracic dextroscoliosis again noted.  IMPRESSION: No significant change in streaky opacity along right heart border, suspicious for mild right middle lobe infiltrate.  Stable cardiomegaly and COPD.   Electronically Signed   By: Sharrie Rothman.D.  On: 03/13/2014 12:31   Dg Chest 2 View  03/11/2014   CLINICAL DATA:  Acute onset of productive cough and nausea. Initial encounter.  EXAM: CHEST  2 VIEW  COMPARISON:  Chest radiograph performed 12/05/2012  FINDINGS: The lungs are hyperexpanded, with flattening of the hemidiaphragms, compatible with COPD. Mild right middle lobe airspace opacity is suggested. There is no evidence of pleural effusion or pneumothorax. Mild peribronchial thickening is noted.  The heart is borderline normal in size; the mediastinal contour is within normal limits. No acute osseous abnormalities are seen. Right convex thoracic scoliosis is noted.  IMPRESSION: 1. Suggestion of mild right middle lobe airspace opacity, which may reflect mild pneumonia. 2. Findings of COPD. 3. Right convex thoracic scoliosis noted.   Electronically Signed   By: Garald Balding M.D.   On: 03/11/2014 02:38   US Venous Img Upper Uni Right  03/15/2014   CLINICAL DATA:  Right upper extremity swelling for 1 day. Post recent IV placement.  EXAM: RIGHT UPPER EXTREMITY VENOUS DOPPLER ULTRASOUND  TECHNIQUE: Gray-scale sonography with graded compression, as well as color Doppler and duplex ultrasound were performed to evaluate the upper extremity deep venous system from the level of the subclavian vein and including the jugular, axillary, basilic, radial, ulnar and upper cephalic vein. Spectral Doppler was utilized to evaluate flow at rest and with distal augmentation maneuvers.  COMPARISON:  None.  FINDINGS: Contralateral Subclavian  Vein: Respiratory phasicity is normal and symmetric with the symptomatic side. No evidence of thrombus. Normal compressibility.  Internal Jugular Vein: No evidence of thrombus. Normal compressibility, respiratory phasicity and response to augmentation.  Subclavian Vein: No evidence of thrombus. Normal compressibility, respiratory phasicity and response to augmentation.  Axillary Vein: No evidence of thrombus. Normal compressibility, respiratory phasicity and response to augmentation.  Cephalic Vein: There is echogenic occlusive thrombus within the cephalic vein at the level of the elbow (images 43 and 44). The cephalic vein appears patent at the level of shoulder / cephalic arch. There is no extension of this superficial thrombophlebitis to the deep venous system of the right upper extremity.  Basilic Vein: No evidence of thrombus. Normal compressibility, respiratory phasicity and response to augmentation.  Brachial Veins: No evidence of thrombus. Normal compressibility, respiratory phasicity and response to augmentation.  Radial Veins: No evidence of thrombus. Normal compressibility, respiratory phasicity and response to augmentation.  Ulnar Veins: No evidence of thrombus. Normal compressibility, respiratory phasicity and response to augmentation.  Venous Reflux:  None visualized.  Other Findings:  None visualized.  IMPRESSION: 1. No evidence of DVT within the right upper extremity. 2. Occlusive superficial thrombophlebitis involving the antecubital aspect of the cephalic vein. Again, there is no extension of this superficial thrombophlebitis to the deep venous system of the right upper extremity.   Electronically Signed   By: Sandi Mariscal M.D.   On: 03/15/2014 18:01     CBC  Recent Labs Lab 03/11/14 0416 03/12/14 0543 03/13/14 0614 03/14/14 0608 03/15/14 0907 03/16/14 0630  WBC 13.6* 13.0* 14.8* 13.0* 12.7* 10.8*  HGB 11.1* 9.7* 8.9* 8.2* 8.4* 7.8*  HCT 33.0* 28.8* 26.5* 24.3* 25.7* 24.1*  PLT 289  320 302 266 293 313  MCV 90.4 90.3 90.8 91.4 92.1 92.7  MCH 30.4 30.4 30.5 30.8 30.1 30.0  MCHC 33.6 33.7 33.6 33.7 32.7 32.4  RDW 13.1 13.3 13.4 13.4 13.5 13.8  LYMPHSABS 1.0 1.7  --   --   --   --   MONOABS 1.4* 1.3*  --   --   --   --  EOSABS 0.0 0.1  --   --   --   --   BASOSABS 0.0 0.0  --   --   --   --     Chemistries   Recent Labs Lab 03/11/14 0340 03/12/14 0543 03/14/14 0608 03/15/14 0907  NA 129* 134* 133* 133*  K 3.6 3.6 3.7 3.6  CL 90* 101 98 98  CO2  --  27 29 27   GLUCOSE 151* 110* 106* 159*  BUN 9 5* 8 7  CREATININE 0.80 0.66 0.79 0.79  CALCIUM  --  8.0* 7.9* 8.2*   ------------------------------------------------------------------------------------------------------------------ estimated creatinine clearance is 42 mL/min (by C-G formula based on Cr of 0.79). ------------------------------------------------------------------------------------------------------------------ No results for input(s): HGBA1C in the last 72 hours. ------------------------------------------------------------------------------------------------------------------ No results for input(s): CHOL, HDL, LDLCALC, TRIG, CHOLHDL, LDLDIRECT in the last 72 hours. ------------------------------------------------------------------------------------------------------------------ No results for input(s): TSH, T4TOTAL, T3FREE, THYROIDAB in the last 72 hours.  Invalid input(s): FREET3 ------------------------------------------------------------------------------------------------------------------ No results for input(s): VITAMINB12, FOLATE, FERRITIN, TIBC, IRON, RETICCTPCT in the last 72 hours.  Coagulation profile  Recent Labs Lab 03/12/14 0543 03/13/14 0614 03/14/14 0608 03/15/14 0546 03/16/14 0630  INR 3.74* 2.97* 3.24* 1.54* 1.37    No results for input(s): DDIMER in the last 72 hours.  Cardiac Enzymes No results for input(s): CKMB, TROPONINI, MYOGLOBIN in the last 168  hours.  Invalid input(s): CK ------------------------------------------------------------------------------------------------------------------ Invalid input(s): POCBNP     Time Spent in minutes   25   MEMON,JEHANZEB M.D on 03/16/2014 at 4:51 PM  Between 7am to 7pm - Pager - [phone]  After 7pm go to [email or web address] - password [facility]  [facility]   Office  [phone]

## 2014-03-16 NOTE — Progress Notes (Signed)
Patient had a small bowel movement but stool was mixed with urine so could not obtain sample.  Collection containers have been adjusted to obtain sample.  Will continue to monitor.

## 2014-03-16 NOTE — Progress Notes (Signed)
Subjective: Nausea improved. Notes 1 loose stool overnight. Cdiff PCR has been ordered but not collected. Able to tolerate pork, rice, and gravy yesterday. No melena, hematochezia.   Objective: Vital signs in last 24 hours: Temp:  [97.8 F (36.6 C)-98.5 F (36.9 C)] 98.5 F (36.9 C) (03/08 0520) Pulse Rate:  [100-116] 100 (03/08 0520) Resp:  [17-20] 20 (03/08 0520) BP: (140-149)/(66-78) 149/66 mmHg (03/08 0520) SpO2:  [95 %-99 %] 97 % (03/08 0723) Last BM Date: 03/15/14 General:   Alert and oriented, pleasant. Pale.  Head:  Normocephalic and atraumatic. Abdomen:  Bowel sounds present, full, diffuse ecchymosis across abdomen and groin, moderately TTP but without guarding or rebound Extremities:  Without edema. Neurologic:  Alert and  oriented x4   Intake/Output from previous day: 03/07 0701 - 03/08 0700 In: 120 [P.O.:120] Out: 500 [Urine:500] Intake/Output this shift:    Lab Results:  Recent Labs  03/14/14 0608 03/15/14 0907 03/16/14 0630  WBC 13.0* 12.7* 10.8*  HGB 8.2* 8.4* 7.8*  HCT 24.3* 25.7* 24.1*  PLT 266 293 313   BMET  Recent Labs  03/14/14 0608 03/15/14 0907  NA 133* 133*  K 3.7 3.6  CL 98 98  CO2 29 27  GLUCOSE 106* 159*  BUN 8 7  CREATININE 0.79 0.79  CALCIUM 7.9* 8.2*   PT/INR  Recent Labs  03/15/14 0546 03/16/14 0630  LABPROT 18.6* 17.0*  INR 1.54* 1.37   Studies/Results: Ct Abdomen Pelvis Wo Contrast  03/14/2014   CLINICAL DATA:  Abdominal pain and decreased hemoglobin. Evaluate for retroperitoneal bleed. On blood thinners. Appendectomy and hysterectomy.  EXAM: CT ABDOMEN AND PELVIS WITHOUT CONTRAST  TECHNIQUE: Multidetector CT imaging of the abdomen and pelvis was performed following the standard protocol without IV contrast.  COMPARISON:  Plain films 12/05/2012 and abdominal ultrasound of 01/22/2008.  FINDINGS: Lower chest: Bibasilar scarring. Mild cardiomegaly. Bilateral pleural thickening.  Hepatobiliary: Mild motion degradation  throughout. Normal noncontrast appearance of the liver. Grossly normal gallbladder, without biliary ductal dilatation.  Pancreas: Normal pancreas for age, without duct dilatation.  Spleen: Normal  Adrenals/Urinary Tract: Normal adrenal glands. Left renal cortical thinning. No renal calculi or hydronephrosis. No bladder calculi.  Stomach/Bowel: Normal stomach, without wall thickening. Fluid filled colon, suggesting a diarrheal state. Normal terminal ileum. Normal small bowel.  Vascular/Lymphatic: Aortic and branch vessel atherosclerosis. No abdominopelvic adenopathy.  Reproductive: Normal uterus and adnexa.  Other: No significant free fluid. No evidence of retroperitoneal hematoma. Small bilateral fat containing inguinal hernias.  Bilateral rectus sheath hematomas. Right measures 7.5 x 6.3 cm. The left measures 3.7 x 4.3 cm.  Soft tissue fullness about the low left pectoral musculature on image 13 may represent minimal intramuscular hematoma or be positional.  Musculoskeletal: Osteopenia. Convex left lumbar spine curvature. Grade 1 L5-S1 anterolisthesis.  IMPRESSION: 1. Bilateral, right larger than left rectus sheath hematomas. These results will be called to the ordering clinician or representative by the Radiologist Assistant, and communication documented in the PACS or zVision Dashboard. 2. No retroperitoneal hemorrhage identified. 3. Degraded exam secondary to motion and lack of oral/IV contrast. 4. Mild soft tissue fullness about the left low pectoral musculature which could be positional or represent a separate area of intramuscular hematoma. Consider physical exam correlation.   Electronically Signed   By: Abigail Miyamoto M.D.   On: 03/14/2014 13:27   US Venous Img Upper Uni Right  03/15/2014   CLINICAL DATA:  Right upper extremity swelling for 1 day. Post recent IV placement.  EXAM: RIGHT UPPER EXTREMITY VENOUS DOPPLER ULTRASOUND  TECHNIQUE: Gray-scale sonography with graded compression, as well as color  Doppler and duplex ultrasound were performed to evaluate the upper extremity deep venous system from the level of the subclavian vein and including the jugular, axillary, basilic, radial, ulnar and upper cephalic vein. Spectral Doppler was utilized to evaluate flow at rest and with distal augmentation maneuvers.  COMPARISON:  None.  FINDINGS: Contralateral Subclavian Vein: Respiratory phasicity is normal and symmetric with the symptomatic side. No evidence of thrombus. Normal compressibility.  Internal Jugular Vein: No evidence of thrombus. Normal compressibility, respiratory phasicity and response to augmentation.  Subclavian Vein: No evidence of thrombus. Normal compressibility, respiratory phasicity and response to augmentation.  Axillary Vein: No evidence of thrombus. Normal compressibility, respiratory phasicity and response to augmentation.  Cephalic Vein: There is echogenic occlusive thrombus within the cephalic vein at the level of the elbow (images 43 and 44). The cephalic vein appears patent at the level of shoulder / cephalic arch. There is no extension of this superficial thrombophlebitis to the deep venous system of the right upper extremity.  Basilic Vein: No evidence of thrombus. Normal compressibility, respiratory phasicity and response to augmentation.  Brachial Veins: No evidence of thrombus. Normal compressibility, respiratory phasicity and response to augmentation.  Radial Veins: No evidence of thrombus. Normal compressibility, respiratory phasicity and response to augmentation.  Ulnar Veins: No evidence of thrombus. Normal compressibility, respiratory phasicity and response to augmentation.  Venous Reflux:  None visualized.  Other Findings:  None visualized.  IMPRESSION: 1. No evidence of DVT within the right upper extremity. 2. Occlusive superficial thrombophlebitis involving the antecubital aspect of the cephalic vein. Again, there is no extension of this superficial thrombophlebitis to the  deep venous system of the right upper extremity.   Electronically Signed   By: Sandi Mariscal M.D.   On: 03/15/2014 18:01    Assessment: 81 year old female admitted with aspiration pneumonia, supratherapeutic INR, N/V, 3 gram drop in Hgb without overt signs of GI bleeding. CT abd/pelvis without contrast without evidence of retroperitoneal bleed; however, it does note bilateral rectus sheath hematomas. Nausea improved with change in antibiotics, and patient is tolerating soft diet. Consider EGD if worsening or persistent nausea.   Anemia: drift in Hgb since yesterday without overt signs of GI bleeding. Multifactorial. May benefit from 1 unit PRBCs. Continue to monitor  Diarrhea: 1 loose stool yesterday morning and 1 overnight. Cdiff PCR ordered but not collected. Remain on contact isolation.   Plan: Follow-up on stool studies (add GI pathogen panel) Follow Hgb Consider EGD if recurrent/persistent nausea BID PPI Zofran with meals Low-residue, lactose free diet  Orvil Feil, ANP-BC [facility]    LOS: 5 days    03/16/2014, 8:11 AM

## 2014-03-17 DIAGNOSIS — R109 Unspecified abdominal pain: Secondary | ICD-10-CM | POA: Insufficient documentation

## 2014-03-17 DIAGNOSIS — R197 Diarrhea, unspecified: Secondary | ICD-10-CM

## 2014-03-17 LAB — TYPE AND SCREEN
ABO/RH(D): O POS
ANTIBODY SCREEN: NEGATIVE
UNIT DIVISION: 0

## 2014-03-17 LAB — PROTIME-INR
INR: 1.25 (ref 0.00–1.49)
PROTHROMBIN TIME: 15.8 s — AB (ref 11.6–15.2)

## 2014-03-17 LAB — CBC
HCT: 28 % — ABNORMAL LOW (ref 36.0–46.0)
Hemoglobin: 9.1 g/dL — ABNORMAL LOW (ref 12.0–15.0)
MCH: 30.1 pg (ref 26.0–34.0)
MCHC: 32.5 g/dL (ref 30.0–36.0)
MCV: 92.7 fL (ref 78.0–100.0)
Platelets: 290 10*3/uL (ref 150–400)
RBC: 3.02 MIL/uL — AB (ref 3.87–5.11)
RDW: 14.4 % (ref 11.5–15.5)
WBC: 10.6 10*3/uL — ABNORMAL HIGH (ref 4.0–10.5)

## 2014-03-17 LAB — CLOSTRIDIUM DIFFICILE BY PCR: Toxigenic C. Difficile by PCR: NEGATIVE

## 2014-03-17 MED ORDER — RISAQUAD PO CAPS
1.0000 | ORAL_CAPSULE | Freq: Every day | ORAL | Status: DC
  Administered 2014-03-17 – 2014-03-18 (×2): 1 via ORAL
  Filled 2014-03-17 (×2): qty 1

## 2014-03-17 NOTE — Progress Notes (Signed)
Patient Demographics  Renee Cameron, is a 81 y.o. female, DOB - 28-May-1924, NOM:[id number]  Admit date - 03/11/2014   Admitting Physician Phillips Grout, MD  Outpatient Primary MD for the patient is Delphina Cahill, MD  LOS - 6   Chief Complaint  Patient presents with  . Nausea  . Cough      Summary  This is a pleasant 81 year old female with history of permanent atrial fibrillation on Coumadin, hypothyroidism, central hypertension was been having chronic nausea for the last 10 days, subsequently developed some vomiting and diarrhea 4 days ago, she aspirated some of her vomitus and I will up the cough and shortness of breath. Came to the ER where she was diagnosed with aspiration pneumonia. She has been placed on appropriate IV antibiotics with improvement in her pneumonia symptoms, she however continues to have persistent nausea.  Upon admission her INR was noted to be supratherapeutic close to 10, she received vitamin K and INR has come down to 2.6, she has developed large bruising in her abdominal wall. she displays no evidence of GI bleed, however she continues to have intense nausea. Today (3/6), given her worsening bruising, GI has ordered a CT scan to r/o a RP bleed, given 10 mg of vitamin K and placed coumadin on hold for now.   Subjective:   Renee Cameron had diarrhea overnight, but no further bowel movements today. Overall she is feeling better.  Assessment & Plan    1. CAP. Patient is symptomatically improved. She's completed a course of antibiotics. She is afebrile and clinically improved  2. Gastroenteritis with nausea vomiting. Improved with taking off azithromycin for her PNA. Continue with symptomatic management. Advanced diet as tolerated. Patient is now having diarrhea. C. difficile  negative. Continue to monitor   3. Dehydration with hyponatremia . Improving with IV fluids.    4. Permanent atrial fibrillation. Heart rate appears to be elevated. Toprol XL changed to lopressor bid for easier titration, anticoagulation discontinued and coumadin reversed. This can be readdressed in the outpatient setting when she follows up with her primary cardiologist  5. Dyslipidemia. Continue statin.  6. Rectus sheath hematomas. Patient was found to have elevated INR on admission at 9. Coumadin was held on admission and she received a dose of vit K. She did not have any trauma/injections to her abdomen She did have significant coughing and possible vomiting prior to admission. Although even having frequent abdominal contractions with coughing would be unlikely to cause bleeding. Hemoglobin appears to be stable. Would not restart anticoagulation at this time. Continue supportive treatment.  7. Acute blood loss anemia. Related to #6. Hemoglobin improved after transfusion. Continue to monitor  Code Status: Full  Family Communication: discussed with patient  Disposition Plan: Home when ready, possibly in am   Procedures  None   Consults  GI   Medications  Scheduled Meds: . acidophilus  1 capsule Oral Daily  . atorvastatin  40 mg Oral QHS  . benzonatate  100 mg Oral TID AC  . guaiFENesin  1,200 mg Oral BID  . metoprolol tartrate  25 mg Oral BID  . ondansetron  4 mg Oral TID WC  . pantoprazole  40 mg Oral BID AC   Continuous Infusions:  PRN Meds:.albuterol, ALPRAZolam, guaiFENesin-dextromethorphan, levalbuterol, ondansetron (ZOFRAN) IV  DVT Prophylaxis  SCDs    Lab Results  Component Value Date   PLT 290 03/17/2014    Antibiotics   Anti-infectives    Start     Dose/Rate Route Frequency Ordered Stop   03/13/14 1600  levofloxacin (LEVAQUIN) IVPB 750 mg  Status:  Discontinued     750 mg 100 mL/hr over 90 Minutes Intravenous Every 48 hours 03/13/14 1331 03/17/14 1925    03/13/14 1000  azithromycin (ZITHROMAX) tablet 500 mg  Status:  Discontinued     500 mg Oral Daily 03/12/14 1029 03/13/14 1334   03/12/14 0600  azithromycin (ZITHROMAX) 500 mg in dextrose 5 % 250 mL IVPB  Status:  Discontinued     500 mg 250 mL/hr over 60 Minutes Intravenous Every 24 hours 03/11/14 0536 03/12/14 1029   03/12/14 0000  cefTRIAXone (ROCEPHIN) 1 g in dextrose 5 % 50 mL IVPB - Premix  Status:  Discontinued     1 g 100 mL/hr over 30 Minutes Intravenous Every 24 hours 03/11/14 0537 03/11/14 0720   03/11/14 0900  ampicillin-sulbactam (UNASYN) 1.5 g in sodium chloride 0.9 % 50 mL IVPB  Status:  Discontinued     1.5 g 100 mL/hr over 30 Minutes Intravenous Every 6 hours 03/11/14 0720 03/13/14 1331   03/11/14 0545  azithromycin (ZITHROMAX) 500 mg in dextrose 5 % 250 mL IVPB  Status:  Discontinued     500 mg 250 mL/hr over 60 Minutes Intravenous Every 24 hours 03/11/14 0537 03/11/14 0538   03/11/14 0330  azithromycin (ZITHROMAX) 500 mg in dextrose 5 % 250 mL IVPB  Status:  Discontinued     500 mg 250 mL/hr over 60 Minutes Intravenous Every 24 hours 03/11/14 0318 03/11/14 0535   03/11/14 0300  cefTRIAXone (ROCEPHIN) injection 1 g     1 g Intramuscular  Once 03/11/14 0247 03/11/14 0256   03/11/14 0300  azithromycin (ZITHROMAX) tablet 500 mg     500 mg Oral  Once 03/11/14 0254 03/11/14 0302          Objective:   Filed Vitals:   03/16/14 2159 03/17/14 0441 03/17/14 0926 03/17/14 1300  BP: 138/82 153/78 123/68 122/66  Pulse: 104 97 97 73  Temp: 98.4 F (36.9 C) 98 F (36.7 C)  98 F (36.7 C)  TempSrc: Oral Oral  Oral  Resp: 20 18  18   Height:      Weight:      SpO2: 99% 95%  99%    Wt Readings from Last 3 Encounters:  03/11/14 56.972 kg (125 lb 9.6 oz)  03/02/14 58.514 kg (129 lb)  09/17/12 56.7 kg (125 lb)     Intake/Output Summary (Last 24 hours) at 03/17/14 1936 Last data filed at 03/17/14 1922  Gross per 24 hour  Intake   1025 ml  Output   1850 ml  Net    -825 ml     Physical Exam  Awake Alert, Oriented X 3, No new F.N deficits, Normal affect [place].AT,PERRAL Supple Neck,No JVD, No cervical lymphadenopathy appriciated.  Symmetrical Chest wall movement, Good air movement bilaterally, CTAB iRRR,No Gallops,Rubs or new Murmurs, No Parasternal Heave +ve B.Sounds, Abd Soft, No tenderness, No organomegaly appreciated, No rebound - guarding or rigidity. No Cyanosis, Clubbing or edema, No new Rash , extensive ecchymosis over abdomen/groin which appear to be improving   Data Review   Micro Results Recent Results (from the past 240 hour(s))  Culture, blood (routine x  2) Call MD if unable to obtain prior to antibiotics being given     Status: None   Collection Time: 03/11/14  5:47 AM  Result Value Ref Range Status   Specimen Description BLOOD RIGHT ARM  Final   Special Requests BOTTLES DRAWN AEROBIC AND ANAEROBIC 5CC  Final   Culture NO GROWTH 5 DAYS  Final   Report Status 03/16/2014 FINAL  Final  Culture, blood (routine x 2) Call MD if unable to obtain prior to antibiotics being given     Status: None   Collection Time: 03/11/14  5:47 AM  Result Value Ref Range Status   Specimen Description BLOOD RIGHT ARM  Final   Special Requests BOTTLES DRAWN AEROBIC AND ANAEROBIC 5CC  Final   Culture NO GROWTH 5 DAYS  Final   Report Status 03/16/2014 FINAL  Final  Clostridium Difficile by PCR     Status: None   Collection Time: 03/16/14 10:55 PM  Result Value Ref Range Status   C difficile by pcr NEGATIVE NEGATIVE Final    Radiology Reports Ct Abdomen Pelvis Wo Contrast  03/14/2014   CLINICAL DATA:  Abdominal pain and decreased hemoglobin. Evaluate for retroperitoneal bleed. On blood thinners. Appendectomy and hysterectomy.  EXAM: CT ABDOMEN AND PELVIS WITHOUT CONTRAST  TECHNIQUE: Multidetector CT imaging of the abdomen and pelvis was performed following the standard protocol without IV contrast.  COMPARISON:  Plain films 12/05/2012 and abdominal  ultrasound of 01/22/2008.  FINDINGS: Lower chest: Bibasilar scarring. Mild cardiomegaly. Bilateral pleural thickening.  Hepatobiliary: Mild motion degradation throughout. Normal noncontrast appearance of the liver. Grossly normal gallbladder, without biliary ductal dilatation.  Pancreas: Normal pancreas for age, without duct dilatation.  Spleen: Normal  Adrenals/Urinary Tract: Normal adrenal glands. Left renal cortical thinning. No renal calculi or hydronephrosis. No bladder calculi.  Stomach/Bowel: Normal stomach, without wall thickening. Fluid filled colon, suggesting a diarrheal state. Normal terminal ileum. Normal small bowel.  Vascular/Lymphatic: Aortic and branch vessel atherosclerosis. No abdominopelvic adenopathy.  Reproductive: Normal uterus and adnexa.  Other: No significant free fluid. No evidence of retroperitoneal hematoma. Small bilateral fat containing inguinal hernias.  Bilateral rectus sheath hematomas. Right measures 7.5 x 6.3 cm. The left measures 3.7 x 4.3 cm.  Soft tissue fullness about the low left pectoral musculature on image 13 may represent minimal intramuscular hematoma or be positional.  Musculoskeletal: Osteopenia. Convex left lumbar spine curvature. Grade 1 L5-S1 anterolisthesis.  IMPRESSION: 1. Bilateral, right larger than left rectus sheath hematomas. These results will be called to the ordering clinician or representative by the Radiologist Assistant, and communication documented in the PACS or zVision Dashboard. 2. No retroperitoneal hemorrhage identified. 3. Degraded exam secondary to motion and lack of oral/IV contrast. 4. Mild soft tissue fullness about the left low pectoral musculature which could be positional or represent a separate area of intramuscular hematoma. Consider physical exam correlation.   Electronically Signed   By: Abigail Miyamoto M.D.   On: 03/14/2014 13:27   Dg Chest 2 View  03/13/2014   CLINICAL DATA:  Community acquired pneumonia. COPD. Atrial fibrillation.   EXAM: CHEST  2 VIEW  COMPARISON:  03/11/2014  FINDINGS: Patient is partially rotated to the left. Cardiomegaly remains stable. Streaky opacity persists along the right heart border, without significant change. Right middle lobe infiltrate cannot be excluded. Left lung remains clear. Pulmonary hyperinflation again seen, consistent with COPD. No evidence of pleural effusion or pulmonary edema. Thoracic dextroscoliosis again noted.  IMPRESSION: No significant change in streaky opacity along right  heart border, suspicious for mild right middle lobe infiltrate.  Stable cardiomegaly and COPD.   Electronically Signed   By: Earle Gell M.D.   On: 03/13/2014 12:31   Dg Chest 2 View  03/11/2014   CLINICAL DATA:  Acute onset of productive cough and nausea. Initial encounter.  EXAM: CHEST  2 VIEW  COMPARISON:  Chest radiograph performed 12/05/2012  FINDINGS: The lungs are hyperexpanded, with flattening of the hemidiaphragms, compatible with COPD. Mild right middle lobe airspace opacity is suggested. There is no evidence of pleural effusion or pneumothorax. Mild peribronchial thickening is noted.  The heart is borderline normal in size; the mediastinal contour is within normal limits. No acute osseous abnormalities are seen. Right convex thoracic scoliosis is noted.  IMPRESSION: 1. Suggestion of mild right middle lobe airspace opacity, which may reflect mild pneumonia. 2. Findings of COPD. 3. Right convex thoracic scoliosis noted.   Electronically Signed   By: Garald Balding M.D.   On: 03/11/2014 02:38   US Venous Img Upper Uni Right  03/15/2014   CLINICAL DATA:  Right upper extremity swelling for 1 day. Post recent IV placement.  EXAM: RIGHT UPPER EXTREMITY VENOUS DOPPLER ULTRASOUND  TECHNIQUE: Gray-scale sonography with graded compression, as well as color Doppler and duplex ultrasound were performed to evaluate the upper extremity deep venous system from the level of the subclavian vein and including the jugular,  axillary, basilic, radial, ulnar and upper cephalic vein. Spectral Doppler was utilized to evaluate flow at rest and with distal augmentation maneuvers.  COMPARISON:  None.  FINDINGS: Contralateral Subclavian Vein: Respiratory phasicity is normal and symmetric with the symptomatic side. No evidence of thrombus. Normal compressibility.  Internal Jugular Vein: No evidence of thrombus. Normal compressibility, respiratory phasicity and response to augmentation.  Subclavian Vein: No evidence of thrombus. Normal compressibility, respiratory phasicity and response to augmentation.  Axillary Vein: No evidence of thrombus. Normal compressibility, respiratory phasicity and response to augmentation.  Cephalic Vein: There is echogenic occlusive thrombus within the cephalic vein at the level of the elbow (images 43 and 44). The cephalic vein appears patent at the level of shoulder / cephalic arch. There is no extension of this superficial thrombophlebitis to the deep venous system of the right upper extremity.  Basilic Vein: No evidence of thrombus. Normal compressibility, respiratory phasicity and response to augmentation.  Brachial Veins: No evidence of thrombus. Normal compressibility, respiratory phasicity and response to augmentation.  Radial Veins: No evidence of thrombus. Normal compressibility, respiratory phasicity and response to augmentation.  Ulnar Veins: No evidence of thrombus. Normal compressibility, respiratory phasicity and response to augmentation.  Venous Reflux:  None visualized.  Other Findings:  None visualized.  IMPRESSION: 1. No evidence of DVT within the right upper extremity. 2. Occlusive superficial thrombophlebitis involving the antecubital aspect of the cephalic vein. Again, there is no extension of this superficial thrombophlebitis to the deep venous system of the right upper extremity.   Electronically Signed   By: Sandi Mariscal M.D.   On: 03/15/2014 18:01     CBC  Recent Labs Lab  03/11/14 0416 03/12/14 0543 03/13/14 0614 03/14/14 0608 03/15/14 0907 03/16/14 0630 03/17/14 0601  WBC 13.6* 13.0* 14.8* 13.0* 12.7* 10.8* 10.6*  HGB 11.1* 9.7* 8.9* 8.2* 8.4* 7.8* 9.1*  HCT 33.0* 28.8* 26.5* 24.3* 25.7* 24.1* 28.0*  PLT 289 320 302 266 293 313 290  MCV 90.4 90.3 90.8 91.4 92.1 92.7 92.7  MCH 30.4 30.4 30.5 30.8 30.1 30.0 30.1  MCHC 33.6 33.7  33.6 33.7 32.7 32.4 32.5  RDW 13.1 13.3 13.4 13.4 13.5 13.8 14.4  LYMPHSABS 1.0 1.7  --   --   --   --   --   MONOABS 1.4* 1.3*  --   --   --   --   --   EOSABS 0.0 0.1  --   --   --   --   --   BASOSABS 0.0 0.0  --   --   --   --   --     Chemistries   Recent Labs Lab 03/11/14 0340 03/12/14 0543 03/14/14 0608 03/15/14 0907  NA 129* 134* 133* 133*  K 3.6 3.6 3.7 3.6  CL 90* 101 98 98  CO2  --  27 29 27   GLUCOSE 151* 110* 106* 159*  BUN 9 5* 8 7  CREATININE 0.80 0.66 0.79 0.79  CALCIUM  --  8.0* 7.9* 8.2*   ------------------------------------------------------------------------------------------------------------------ estimated creatinine clearance is 42 mL/min (by C-G formula based on Cr of 0.79). ------------------------------------------------------------------------------------------------------------------ No results for input(s): HGBA1C in the last 72 hours. ------------------------------------------------------------------------------------------------------------------ No results for input(s): CHOL, HDL, LDLCALC, TRIG, CHOLHDL, LDLDIRECT in the last 72 hours. ------------------------------------------------------------------------------------------------------------------ No results for input(s): TSH, T4TOTAL, T3FREE, THYROIDAB in the last 72 hours.  Invalid input(s): FREET3 ------------------------------------------------------------------------------------------------------------------ No results for input(s): VITAMINB12, FOLATE, FERRITIN, TIBC, IRON, RETICCTPCT in the last 72 hours.  Coagulation  profile  Recent Labs Lab 03/13/14 0614 03/14/14 0608 03/15/14 0546 03/16/14 0630 03/17/14 0601  INR 2.97* 3.24* 1.54* 1.37 1.25    No results for input(s): DDIMER in the last 72 hours.  Cardiac Enzymes No results for input(s): CKMB, TROPONINI, MYOGLOBIN in the last 168 hours.  Invalid input(s): CK ------------------------------------------------------------------------------------------------------------------ Invalid input(s): POCBNP     Time Spent in minutes   25   MEMON,JEHANZEB M.D on 03/17/2014 at 7:36 PM  Between 7am to 7pm - Pager - [phone]  After 7pm go to [email or web address] - password [facility]  [facility]   Office  [phone]

## 2014-03-17 NOTE — Progress Notes (Signed)
Subjective:  Had a rough night. Multiple episodes (at least 6 in the last 24 hours) of nonbloody diarrhea. Complains of abdominal soreness. No further nausea.   Objective: Vital signs in last 24 hours: Temp:  [97.7 F (36.5 C)-98.4 F (36.9 C)] 98 F (36.7 C) (03/09 0441) Pulse Rate:  [74-104] 97 (03/09 0441) Resp:  [14-20] 18 (03/09 0441) BP: (122-153)/(50-82) 153/78 mmHg (03/09 0441) SpO2:  [91 %-99 %] 95 % (03/09 0441) Last BM Date: 03/17/14 General:   Alert,  Well-developed, well-nourished, pleasant and cooperative in NAD Head:  Normocephalic and atraumatic. Eyes:  Sclera clear, no icterus.  Abdomen:  Soft, mild diffuse tenderness and nondistended. Ecchymoses noted in the entire upper abdomen, improving per patient.  Normal bowel sounds, without guarding, and without rebound.   Extremities:  Without clubbing, deformity or edema. Neurologic:  Alert and  oriented x4;  grossly normal neurologically. Skin:  Intact without significant lesions or rashes. Psych:  Alert and cooperative. Normal mood and affect.  Intake/Output from previous day: 03/08 0701 - 03/09 0700 In: 1065 [P.O.:480; I.V.:585] Out: 450 [Urine:450] Intake/Output this shift: Total I/O In: -  Out: 350 [Urine:350]  Lab Results: CBC  Recent Labs  03/15/14 0907 03/16/14 0630 03/17/14 0601  WBC 12.7* 10.8* 10.6*  HGB 8.4* 7.8* 9.1*  HCT 25.7* 24.1* 28.0*  MCV 92.1 92.7 92.7  PLT 293 313 290   BMET  Recent Labs  03/15/14 0907  NA 133*  K 3.6  CL 98  CO2 27  GLUCOSE 159*  BUN 7  CREATININE 0.79  CALCIUM 8.2*   LFTs No results for input(s): BILITOT, BILIDIR, IBILI, ALKPHOS, AST, ALT, PROT, ALBUMIN in the last 72 hours. No results for input(s): LIPASE in the last 72 hours. PT/INR  Recent Labs  03/15/14 0546 03/16/14 0630 03/17/14 0601  LABPROT 18.6* 17.0* 15.8*  INR 1.54* 1.37 1.25      Imaging Studies: Ct Abdomen Pelvis Wo Contrast  03/14/2014   CLINICAL DATA:  Abdominal pain and  decreased hemoglobin. Evaluate for retroperitoneal bleed. On blood thinners. Appendectomy and hysterectomy.  EXAM: CT ABDOMEN AND PELVIS WITHOUT CONTRAST  TECHNIQUE: Multidetector CT imaging of the abdomen and pelvis was performed following the standard protocol without IV contrast.  COMPARISON:  Plain films 12/05/2012 and abdominal ultrasound of 01/22/2008.  FINDINGS: Lower chest: Bibasilar scarring. Mild cardiomegaly. Bilateral pleural thickening.  Hepatobiliary: Mild motion degradation throughout. Normal noncontrast appearance of the liver. Grossly normal gallbladder, without biliary ductal dilatation.  Pancreas: Normal pancreas for age, without duct dilatation.  Spleen: Normal  Adrenals/Urinary Tract: Normal adrenal glands. Left renal cortical thinning. No renal calculi or hydronephrosis. No bladder calculi.  Stomach/Bowel: Normal stomach, without wall thickening. Fluid filled colon, suggesting a diarrheal state. Normal terminal ileum. Normal small bowel.  Vascular/Lymphatic: Aortic and branch vessel atherosclerosis. No abdominopelvic adenopathy.  Reproductive: Normal uterus and adnexa.  Other: No significant free fluid. No evidence of retroperitoneal hematoma. Small bilateral fat containing inguinal hernias.  Bilateral rectus sheath hematomas. Right measures 7.5 x 6.3 cm. The left measures 3.7 x 4.3 cm.  Soft tissue fullness about the low left pectoral musculature on image 13 may represent minimal intramuscular hematoma or be positional.  Musculoskeletal: Osteopenia. Convex left lumbar spine curvature. Grade 1 L5-S1 anterolisthesis.  IMPRESSION: 1. Bilateral, right larger than left rectus sheath hematomas. These results will be called to the ordering clinician or representative by the Radiologist Assistant, and communication documented in the PACS or zVision Dashboard. 2. No retroperitoneal hemorrhage identified. 3. Degraded  exam secondary to motion and lack of oral/IV contrast. 4. Mild soft tissue fullness  about the left low pectoral musculature which could be positional or represent a separate area of intramuscular hematoma. Consider physical exam correlation.   Electronically Signed   By: Abigail Miyamoto M.D.   On: 03/14/2014 13:27   Dg Chest 2 View  03/13/2014   CLINICAL DATA:  Community acquired pneumonia. COPD. Atrial fibrillation.  EXAM: CHEST  2 VIEW  COMPARISON:  03/11/2014  FINDINGS: Patient is partially rotated to the left. Cardiomegaly remains stable. Streaky opacity persists along the right heart border, without significant change. Right middle lobe infiltrate cannot be excluded. Left lung remains clear. Pulmonary hyperinflation again seen, consistent with COPD. No evidence of pleural effusion or pulmonary edema. Thoracic dextroscoliosis again noted.  IMPRESSION: No significant change in streaky opacity along right heart border, suspicious for mild right middle lobe infiltrate.  Stable cardiomegaly and COPD.   Electronically Signed   By: Earle Gell M.D.   On: 03/13/2014 12:31   Dg Chest 2 View  03/11/2014   CLINICAL DATA:  Acute onset of productive cough and nausea. Initial encounter.  EXAM: CHEST  2 VIEW  COMPARISON:  Chest radiograph performed 12/05/2012  FINDINGS: The lungs are hyperexpanded, with flattening of the hemidiaphragms, compatible with COPD. Mild right middle lobe airspace opacity is suggested. There is no evidence of pleural effusion or pneumothorax. Mild peribronchial thickening is noted.  The heart is borderline normal in size; the mediastinal contour is within normal limits. No acute osseous abnormalities are seen. Right convex thoracic scoliosis is noted.  IMPRESSION: 1. Suggestion of mild right middle lobe airspace opacity, which may reflect mild pneumonia. 2. Findings of COPD. 3. Right convex thoracic scoliosis noted.   Electronically Signed   By: Garald Balding M.D.   On: 03/11/2014 02:38   US Venous Img Upper Uni Right  03/15/2014   CLINICAL DATA:  Right upper extremity swelling  for 1 day. Post recent IV placement.  EXAM: RIGHT UPPER EXTREMITY VENOUS DOPPLER ULTRASOUND  TECHNIQUE: Gray-scale sonography with graded compression, as well as color Doppler and duplex ultrasound were performed to evaluate the upper extremity deep venous system from the level of the subclavian vein and including the jugular, axillary, basilic, radial, ulnar and upper cephalic vein. Spectral Doppler was utilized to evaluate flow at rest and with distal augmentation maneuvers.  COMPARISON:  None.  FINDINGS: Contralateral Subclavian Vein: Respiratory phasicity is normal and symmetric with the symptomatic side. No evidence of thrombus. Normal compressibility.  Internal Jugular Vein: No evidence of thrombus. Normal compressibility, respiratory phasicity and response to augmentation.  Subclavian Vein: No evidence of thrombus. Normal compressibility, respiratory phasicity and response to augmentation.  Axillary Vein: No evidence of thrombus. Normal compressibility, respiratory phasicity and response to augmentation.  Cephalic Vein: There is echogenic occlusive thrombus within the cephalic vein at the level of the elbow (images 43 and 44). The cephalic vein appears patent at the level of shoulder / cephalic arch. There is no extension of this superficial thrombophlebitis to the deep venous system of the right upper extremity.  Basilic Vein: No evidence of thrombus. Normal compressibility, respiratory phasicity and response to augmentation.  Brachial Veins: No evidence of thrombus. Normal compressibility, respiratory phasicity and response to augmentation.  Radial Veins: No evidence of thrombus. Normal compressibility, respiratory phasicity and response to augmentation.  Ulnar Veins: No evidence of thrombus. Normal compressibility, respiratory phasicity and response to augmentation.  Venous Reflux:  None visualized.  Other  Findings:  None visualized.  IMPRESSION: 1. No evidence of DVT within the right upper extremity. 2.  Occlusive superficial thrombophlebitis involving the antecubital aspect of the cephalic vein. Again, there is no extension of this superficial thrombophlebitis to the deep venous system of the right upper extremity.   Electronically Signed   By: Sandi Mariscal M.D.   On: 03/15/2014 18:01  [2 weeks]   Assessment:  81 year old female admitted with aspiration pneumonia, supratherapeutic INR, N/V, 3 gram drop in Hgb without overt signs of GI bleeding. CT abd/pelvis without contrast without evidence of retroperitoneal bleed; however, it does note bilateral rectus sheath hematomas. Nausea improved with change in antibiotics, and patient is tolerating soft diet.     Anemia: drift in Hgb  without overt signs of GI bleeding received 1 unit PRBCs with appropriate increase in Hgb.  Diarrhea: multiple loose stools overnight. Cdiff PCR negative. Suspect antibiotic associated but will check GI pathogen panel. Monitor today.     Plan: 1. Follow up pending stools. 2. Continue current regimen of PPI and Zofran.   Laureen Ochs. Bernarda Caffey [facility] [phone] 3/9/20169:10 AM     LOS: 6 days     Attending note:  Patient seen and examined. States she's feeling much better this afternoon as she reports; nausea has resolved. No stool at all today. Tolerating a regular diet.  Will continue to follow.

## 2014-03-18 ENCOUNTER — Encounter (HOSPITAL_COMMUNITY): Payer: Self-pay | Admitting: Gastroenterology

## 2014-03-18 LAB — CBC
HEMATOCRIT: 29.2 % — AB (ref 36.0–46.0)
HEMOGLOBIN: 9.4 g/dL — AB (ref 12.0–15.0)
MCH: 29.8 pg (ref 26.0–34.0)
MCHC: 32.2 g/dL (ref 30.0–36.0)
MCV: 92.7 fL (ref 78.0–100.0)
Platelets: 318 10*3/uL (ref 150–400)
RBC: 3.15 MIL/uL — ABNORMAL LOW (ref 3.87–5.11)
RDW: 14.2 % (ref 11.5–15.5)
WBC: 10 10*3/uL (ref 4.0–10.5)

## 2014-03-18 LAB — PROTIME-INR
INR: 1.25 (ref 0.00–1.49)
Prothrombin Time: 15.8 seconds — ABNORMAL HIGH (ref 11.6–15.2)

## 2014-03-18 MED ORDER — RISAQUAD PO CAPS: 1.0000 | ORAL_CAPSULE | Freq: Every day | ORAL | Status: DC

## 2014-03-18 MED ORDER — PANTOPRAZOLE SODIUM 40 MG PO TBEC: 40.0000 mg | DELAYED_RELEASE_TABLET | Freq: Two times a day (BID) | ORAL | Status: DC

## 2014-03-18 NOTE — Discharge Summary (Signed)
Physician Discharge Summary  Renee Cameron JIR:[id number] DOB: [birth year] DOA: 03/11/2014  PCP: Delphina Cahill, MD  Admit date: 03/11/2014 Discharge date: 03/18/2014  Time spent: 40 minutes  Recommendations for Outpatient Follow-up:  1. Follow-up with cardiology in the next 1 week. Coumadin has been discontinued due to bleeding. Recommend readdressing anticoagulation in the outpatient setting. 2. Follow-up with primary care physician in 1-2 weeks with repeat CBC.  Discharge Diagnoses:  Principal Problem:   CAP (community acquired pneumonia) Active Problems:   Atrial fibrillation   Hypertension   Vomiting   Hyponatremia   Acute blood loss anemia   Bruise, trunk   Rectus sheath hematoma   Abdominal pain in female   Diarrhea   Discharge Condition: Improved  Diet recommendation: Low-salt  Filed Weights   03/11/14 0107 03/11/14 0540  Weight: 56.246 kg (124 lb) 56.972 kg (125 lb 9.6 oz)    History of present illness:  This patient was admitted to the hospital with progressively worsening cough and decreased by mouth intake. Imaging in the emergency room indicated an underlying pneumonia. She was admitted for further evaluation.  Hospital Course:  Patient was started on intravenous antibiotics and clinically improved. She is no longer short of breath. She still has a mild cough but has significantly improved. She's completed her antibiotics here in the hospital.  Patient's INR was noted to be markedly elevated at 9 on admission. This was related to Coumadin. Shortly after admission, she developed a significant drop in her hemoglobin approximately 3 g. CT scan of the abdomen and pelvis indicated bilateral rectus sheath hematomas. She did extensive bruising in her abdomen, lower chest and back. INR was reversed and Coumadin was not restarted. She ended up receiving 1 unit of PRBC during her hospital stay. Hemoglobin has since stabilized. Would not restart anticoagulation at this time. I  have recommended that she follow-up with her primary cardiologist regarding resumption of anticoagulation if felt appropriate.  Patient did have nausea and vomiting on admission. She was seen by gastroenterology. Her symptoms have since resolved. She is now tolerating a solid diet. The patient is otherwise stable for discharge home.  Procedures:    Consultations:  gastroenterology  Discharge Exam: Filed Vitals:   03/18/14 0341  BP: 142/71  Pulse: 70  Temp: 98.3 F (36.8 C)  Resp: 20    General: NAD Cardiovascular: S1, S2 RRR Respiratory: CTA B  Discharge Instructions   Discharge Instructions    Call MD for:  extreme fatigue    Complete by:  As directed      Call MD for:  persistant dizziness or light-headedness    Complete by:  As directed      Call MD for:  persistant nausea and vomiting    Complete by:  As directed      Diet - low sodium heart healthy    Complete by:  As directed      Increase activity slowly    Complete by:  As directed           Discharge Medication List as of 03/18/2014  1:07 PM    START taking these medications   Details  acidophilus (RISAQUAD) CAPS capsule Take 1 capsule by mouth daily., Starting 03/18/2014, Until Discontinued, Print    pantoprazole (PROTONIX) 40 MG tablet Take 1 tablet (40 mg total) by mouth 2 (two) times daily before a meal., Starting 03/18/2014, Until Discontinued, Normal      CONTINUE these medications which have NOT CHANGED   Details  ALPRAZolam (XANAX) 0.5 MG tablet Take 0.5 mg by mouth at bedtime as needed for anxiety or sleep. , Until Discontinued, Historical Med    ascorbic Acid (VITAMIN C CR) 500 MG CPCR Take 500 mg by mouth daily.  , Until Discontinued, Historical Med    atorvastatin (LIPITOR) 40 MG tablet Take 1 tablet (40 mg total) by mouth at bedtime., Starting 03/01/2014, Until Discontinued, Normal    lisinopril (PRINIVIL,ZESTRIL) 20 MG tablet Take 1 tablet (20 mg total) by mouth daily., Starting 08/14/2013,  Until Discontinued, Normal    metoprolol succinate (TOPROL-XL) 25 MG 24 hr tablet TAKE 1 TABLET BY MOUTH EVERY DAY, Normal    Multiple Vitamins-Minerals (CENTRUM SILVER PO) Take 1 tablet by mouth daily.  , Until Discontinued, Historical Med    Multiple Vitamins-Minerals (EYE VITAMINS PO) Take 2 capsules by mouth daily., Until Discontinued, Historical Med    Omega-3 Fatty Acids (FISH OIL) 1000 MG CAPS Take 1 capsule by mouth daily.  , Until Discontinued, Historical Med      STOP taking these medications     warfarin (COUMADIN) 5 MG tablet        No Known Allergies    The results of significant diagnostics from this hospitalization (including imaging, microbiology, ancillary and laboratory) are listed below for reference.    Significant Diagnostic Studies: Ct Abdomen Pelvis Wo Contrast  03/14/2014   CLINICAL DATA:  Abdominal pain and decreased hemoglobin. Evaluate for retroperitoneal bleed. On blood thinners. Appendectomy and hysterectomy.  EXAM: CT ABDOMEN AND PELVIS WITHOUT CONTRAST  TECHNIQUE: Multidetector CT imaging of the abdomen and pelvis was performed following the standard protocol without IV contrast.  COMPARISON:  Plain films 12/05/2012 and abdominal ultrasound of 01/22/2008.  FINDINGS: Lower chest: Bibasilar scarring. Mild cardiomegaly. Bilateral pleural thickening.  Hepatobiliary: Mild motion degradation throughout. Normal noncontrast appearance of the liver. Grossly normal gallbladder, without biliary ductal dilatation.  Pancreas: Normal pancreas for age, without duct dilatation.  Spleen: Normal  Adrenals/Urinary Tract: Normal adrenal glands. Left renal cortical thinning. No renal calculi or hydronephrosis. No bladder calculi.  Stomach/Bowel: Normal stomach, without wall thickening. Fluid filled colon, suggesting a diarrheal state. Normal terminal ileum. Normal small bowel.  Vascular/Lymphatic: Aortic and branch vessel atherosclerosis. No abdominopelvic adenopathy.  Reproductive:  Normal uterus and adnexa.  Other: No significant free fluid. No evidence of retroperitoneal hematoma. Small bilateral fat containing inguinal hernias.  Bilateral rectus sheath hematomas. Right measures 7.5 x 6.3 cm. The left measures 3.7 x 4.3 cm.  Soft tissue fullness about the low left pectoral musculature on image 13 may represent minimal intramuscular hematoma or be positional.  Musculoskeletal: Osteopenia. Convex left lumbar spine curvature. Grade 1 L5-S1 anterolisthesis.  IMPRESSION: 1. Bilateral, right larger than left rectus sheath hematomas. These results will be called to the ordering clinician or representative by the Radiologist Assistant, and communication documented in the PACS or zVision Dashboard. 2. No retroperitoneal hemorrhage identified. 3. Degraded exam secondary to motion and lack of oral/IV contrast. 4. Mild soft tissue fullness about the left low pectoral musculature which could be positional or represent a separate area of intramuscular hematoma. Consider physical exam correlation.   Electronically Signed   By: Abigail Miyamoto M.D.   On: 03/14/2014 13:27   Dg Chest 2 View  03/13/2014   CLINICAL DATA:  Community acquired pneumonia. COPD. Atrial fibrillation.  EXAM: CHEST  2 VIEW  COMPARISON:  03/11/2014  FINDINGS: Patient is partially rotated to the left. Cardiomegaly remains stable. Streaky opacity persists along the right heart  border, without significant change. Right middle lobe infiltrate cannot be excluded. Left lung remains clear. Pulmonary hyperinflation again seen, consistent with COPD. No evidence of pleural effusion or pulmonary edema. Thoracic dextroscoliosis again noted.  IMPRESSION: No significant change in streaky opacity along right heart border, suspicious for mild right middle lobe infiltrate.  Stable cardiomegaly and COPD.   Electronically Signed   By: Earle Gell M.D.   On: 03/13/2014 12:31   Dg Chest 2 View  03/11/2014   CLINICAL DATA:  Acute onset of productive cough  and nausea. Initial encounter.  EXAM: CHEST  2 VIEW  COMPARISON:  Chest radiograph performed 12/05/2012  FINDINGS: The lungs are hyperexpanded, with flattening of the hemidiaphragms, compatible with COPD. Mild right middle lobe airspace opacity is suggested. There is no evidence of pleural effusion or pneumothorax. Mild peribronchial thickening is noted.  The heart is borderline normal in size; the mediastinal contour is within normal limits. No acute osseous abnormalities are seen. Right convex thoracic scoliosis is noted.  IMPRESSION: 1. Suggestion of mild right middle lobe airspace opacity, which may reflect mild pneumonia. 2. Findings of COPD. 3. Right convex thoracic scoliosis noted.   Electronically Signed   By: Garald Balding M.D.   On: 03/11/2014 02:38   US Venous Img Upper Uni Right  03/15/2014   CLINICAL DATA:  Right upper extremity swelling for 1 day. Post recent IV placement.  EXAM: RIGHT UPPER EXTREMITY VENOUS DOPPLER ULTRASOUND  TECHNIQUE: Gray-scale sonography with graded compression, as well as color Doppler and duplex ultrasound were performed to evaluate the upper extremity deep venous system from the level of the subclavian vein and including the jugular, axillary, basilic, radial, ulnar and upper cephalic vein. Spectral Doppler was utilized to evaluate flow at rest and with distal augmentation maneuvers.  COMPARISON:  None.  FINDINGS: Contralateral Subclavian Vein: Respiratory phasicity is normal and symmetric with the symptomatic side. No evidence of thrombus. Normal compressibility.  Internal Jugular Vein: No evidence of thrombus. Normal compressibility, respiratory phasicity and response to augmentation.  Subclavian Vein: No evidence of thrombus. Normal compressibility, respiratory phasicity and response to augmentation.  Axillary Vein: No evidence of thrombus. Normal compressibility, respiratory phasicity and response to augmentation.  Cephalic Vein: There is echogenic occlusive thrombus  within the cephalic vein at the level of the elbow (images 43 and 44). The cephalic vein appears patent at the level of shoulder / cephalic arch. There is no extension of this superficial thrombophlebitis to the deep venous system of the right upper extremity.  Basilic Vein: No evidence of thrombus. Normal compressibility, respiratory phasicity and response to augmentation.  Brachial Veins: No evidence of thrombus. Normal compressibility, respiratory phasicity and response to augmentation.  Radial Veins: No evidence of thrombus. Normal compressibility, respiratory phasicity and response to augmentation.  Ulnar Veins: No evidence of thrombus. Normal compressibility, respiratory phasicity and response to augmentation.  Venous Reflux:  None visualized.  Other Findings:  None visualized.  IMPRESSION: 1. No evidence of DVT within the right upper extremity. 2. Occlusive superficial thrombophlebitis involving the antecubital aspect of the cephalic vein. Again, there is no extension of this superficial thrombophlebitis to the deep venous system of the right upper extremity.   Electronically Signed   By: Sandi Mariscal M.D.   On: 03/15/2014 18:01    Microbiology: Recent Results (from the past 240 hour(s))  Culture, blood (routine x 2) Call MD if unable to obtain prior to antibiotics being given     Status: None   Collection  Time: 03/11/14  5:47 AM  Result Value Ref Range Status   Specimen Description BLOOD RIGHT ARM  Final   Special Requests BOTTLES DRAWN AEROBIC AND ANAEROBIC 5CC  Final   Culture NO GROWTH 5 DAYS  Final   Report Status 03/16/2014 FINAL  Final  Culture, blood (routine x 2) Call MD if unable to obtain prior to antibiotics being given     Status: None   Collection Time: 03/11/14  5:47 AM  Result Value Ref Range Status   Specimen Description BLOOD RIGHT ARM  Final   Special Requests BOTTLES DRAWN AEROBIC AND ANAEROBIC 5CC  Final   Culture NO GROWTH 5 DAYS  Final   Report Status 03/16/2014 FINAL   Final  Clostridium Difficile by PCR     Status: None   Collection Time: 03/16/14 10:55 PM  Result Value Ref Range Status   C difficile by pcr NEGATIVE NEGATIVE Final     Labs: Basic Metabolic Panel:  Recent Labs Lab 03/12/14 0543 03/14/14 0608 03/15/14 0907  NA 134* 133* 133*  K 3.6 3.7 3.6  CL 101 98 98  CO2 27 29 27   GLUCOSE 110* 106* 159*  BUN 5* 8 7  CREATININE 0.66 0.79 0.79  CALCIUM 8.0* 7.9* 8.2*   Liver Function Tests: No results for input(s): AST, ALT, ALKPHOS, BILITOT, PROT, ALBUMIN in the last 168 hours. No results for input(s): LIPASE, AMYLASE in the last 168 hours. No results for input(s): AMMONIA in the last 168 hours. CBC:  Recent Labs Lab 03/12/14 0543  03/14/14 0608 03/15/14 0907 03/16/14 0630 03/17/14 0601 03/18/14 0601  WBC 13.0*  < > 13.0* 12.7* 10.8* 10.6* 10.0  NEUTROABS 9.9*  --   --   --   --   --   --   HGB 9.7*  < > 8.2* 8.4* 7.8* 9.1* 9.4*  HCT 28.8*  < > 24.3* 25.7* 24.1* 28.0* 29.2*  MCV 90.3  < > 91.4 92.1 92.7 92.7 92.7  PLT 320  < > 266 293 313 290 318  < > = values in this interval not displayed. Cardiac Enzymes: No results for input(s): CKTOTAL, CKMB, CKMBINDEX, TROPONINI in the last 168 hours. BNP: BNP (last 3 results) No results for input(s): BNP in the last 8760 hours.  ProBNP (last 3 results) No results for input(s): PROBNP in the last 8760 hours.  CBG: No results for input(s): GLUCAP in the last 168 hours.     Signed:  MEMON,JEHANZEB  [facility] 03/18/2014, 7:56 PM

## 2014-03-18 NOTE — Progress Notes (Signed)
Patient discharged home today.  Patient and family were given discharge instructions, prescriptions, and care notes.  Patient and family verbalized understanding with no complaints or concerns voiced at this time.  IV was removed with catheter intact, no bleeding or complications.  Patient left unit in stable condition by a staff member in a wheelchair.

## 2014-03-18 NOTE — Progress Notes (Signed)
Subjective:  Did not sleep well last night. Only one BM in the last 24 hours, more formed. Tolerating diet.   Objective: Vital signs in last 24 hours: Temp:  [98 F (36.7 C)-98.7 F (37.1 C)] 98.3 F (36.8 C) (03/10 0341) Pulse Rate:  [70-97] 70 (03/10 0341) Resp:  [18-20] 20 (03/10 0341) BP: (119-142)/(50-71) 142/71 mmHg (03/10 0341) SpO2:  [95 %-99 %] 95 % (03/10 0341) Last BM Date: 03/17/14 General:   Alert,  Well-developed, well-nourished, pleasant and cooperative in NAD Head:  Normocephalic and atraumatic. Eyes:  Sclera clear, no icterus.  Abdomen:  Soft, nontender and nondistended.  Normal bowel sounds, without guarding, and without rebound.   Extremities:  Without clubbing, deformity or edema. Neurologic:  Alert and  oriented x4;  grossly normal neurologically. Skin:  Intact without significant lesions or rashes. Psych:  Alert and cooperative. Normal mood and affect.  Intake/Output from previous day: 03/09 0701 - 03/10 0700 In: 930 [P.O.:930] Out: 4000 [Urine:4000] Intake/Output this shift:    Lab Results: CBC  Recent Labs  03/16/14 0630 03/17/14 0601 03/18/14 0601  WBC 10.8* 10.6* 10.0  HGB 7.8* 9.1* 9.4*  HCT 24.1* 28.0* 29.2*  MCV 92.7 92.7 92.7  PLT 313 290 318   BMET  Recent Labs  03/15/14 0907  NA 133*  K 3.6  CL 98  CO2 27  GLUCOSE 159*  BUN 7  CREATININE 0.79  CALCIUM 8.2*   LFTs No results for input(s): BILITOT, BILIDIR, IBILI, ALKPHOS, AST, ALT, PROT, ALBUMIN in the last 72 hours. No results for input(s): LIPASE in the last 72 hours. PT/INR  Recent Labs  03/16/14 0630 03/17/14 0601 03/18/14 0601  LABPROT 17.0* 15.8* 15.8*  INR 1.37 1.25 1.25      Imaging Studies: Ct Abdomen Pelvis Wo Contrast  03/14/2014   CLINICAL DATA:  Abdominal pain and decreased hemoglobin. Evaluate for retroperitoneal bleed. On blood thinners. Appendectomy and hysterectomy.  EXAM: CT ABDOMEN AND PELVIS WITHOUT CONTRAST  TECHNIQUE: Multidetector CT  imaging of the abdomen and pelvis was performed following the standard protocol without IV contrast.  COMPARISON:  Plain films 12/05/2012 and abdominal ultrasound of 01/22/2008.  FINDINGS: Lower chest: Bibasilar scarring. Mild cardiomegaly. Bilateral pleural thickening.  Hepatobiliary: Mild motion degradation throughout. Normal noncontrast appearance of the liver. Grossly normal gallbladder, without biliary ductal dilatation.  Pancreas: Normal pancreas for age, without duct dilatation.  Spleen: Normal  Adrenals/Urinary Tract: Normal adrenal glands. Left renal cortical thinning. No renal calculi or hydronephrosis. No bladder calculi.  Stomach/Bowel: Normal stomach, without wall thickening. Fluid filled colon, suggesting a diarrheal state. Normal terminal ileum. Normal small bowel.  Vascular/Lymphatic: Aortic and branch vessel atherosclerosis. No abdominopelvic adenopathy.  Reproductive: Normal uterus and adnexa.  Other: No significant free fluid. No evidence of retroperitoneal hematoma. Small bilateral fat containing inguinal hernias.  Bilateral rectus sheath hematomas. Right measures 7.5 x 6.3 cm. The left measures 3.7 x 4.3 cm.  Soft tissue fullness about the low left pectoral musculature on image 13 may represent minimal intramuscular hematoma or be positional.  Musculoskeletal: Osteopenia. Convex left lumbar spine curvature. Grade 1 L5-S1 anterolisthesis.  IMPRESSION: 1. Bilateral, right larger than left rectus sheath hematomas. These results will be called to the ordering clinician or representative by the Radiologist Assistant, and communication documented in the PACS or zVision Dashboard. 2. No retroperitoneal hemorrhage identified. 3. Degraded exam secondary to motion and lack of oral/IV contrast. 4. Mild soft tissue fullness about the left low pectoral musculature which could be positional or  represent a separate area of intramuscular hematoma. Consider physical exam correlation.   Electronically Signed    By: Abigail Miyamoto M.D.   On: 03/14/2014 13:27   Dg Chest 2 View  03/13/2014   CLINICAL DATA:  Community acquired pneumonia. COPD. Atrial fibrillation.  EXAM: CHEST  2 VIEW  COMPARISON:  03/11/2014  FINDINGS: Patient is partially rotated to the left. Cardiomegaly remains stable. Streaky opacity persists along the right heart border, without significant change. Right middle lobe infiltrate cannot be excluded. Left lung remains clear. Pulmonary hyperinflation again seen, consistent with COPD. No evidence of pleural effusion or pulmonary edema. Thoracic dextroscoliosis again noted.  IMPRESSION: No significant change in streaky opacity along right heart border, suspicious for mild right middle lobe infiltrate.  Stable cardiomegaly and COPD.   Electronically Signed   By: Earle Gell M.D.   On: 03/13/2014 12:31   Dg Chest 2 View  03/11/2014   CLINICAL DATA:  Acute onset of productive cough and nausea. Initial encounter.  EXAM: CHEST  2 VIEW  COMPARISON:  Chest radiograph performed 12/05/2012  FINDINGS: The lungs are hyperexpanded, with flattening of the hemidiaphragms, compatible with COPD. Mild right middle lobe airspace opacity is suggested. There is no evidence of pleural effusion or pneumothorax. Mild peribronchial thickening is noted.  The heart is borderline normal in size; the mediastinal contour is within normal limits. No acute osseous abnormalities are seen. Right convex thoracic scoliosis is noted.  IMPRESSION: 1. Suggestion of mild right middle lobe airspace opacity, which may reflect mild pneumonia. 2. Findings of COPD. 3. Right convex thoracic scoliosis noted.   Electronically Signed   By: Garald Balding M.D.   On: 03/11/2014 02:38   US Venous Img Upper Uni Right  03/15/2014   CLINICAL DATA:  Right upper extremity swelling for 1 day. Post recent IV placement.  EXAM: RIGHT UPPER EXTREMITY VENOUS DOPPLER ULTRASOUND  TECHNIQUE: Gray-scale sonography with graded compression, as well as color Doppler and  duplex ultrasound were performed to evaluate the upper extremity deep venous system from the level of the subclavian vein and including the jugular, axillary, basilic, radial, ulnar and upper cephalic vein. Spectral Doppler was utilized to evaluate flow at rest and with distal augmentation maneuvers.  COMPARISON:  None.  FINDINGS: Contralateral Subclavian Vein: Respiratory phasicity is normal and symmetric with the symptomatic side. No evidence of thrombus. Normal compressibility.  Internal Jugular Vein: No evidence of thrombus. Normal compressibility, respiratory phasicity and response to augmentation.  Subclavian Vein: No evidence of thrombus. Normal compressibility, respiratory phasicity and response to augmentation.  Axillary Vein: No evidence of thrombus. Normal compressibility, respiratory phasicity and response to augmentation.  Cephalic Vein: There is echogenic occlusive thrombus within the cephalic vein at the level of the elbow (images 43 and 44). The cephalic vein appears patent at the level of shoulder / cephalic arch. There is no extension of this superficial thrombophlebitis to the deep venous system of the right upper extremity.  Basilic Vein: No evidence of thrombus. Normal compressibility, respiratory phasicity and response to augmentation.  Brachial Veins: No evidence of thrombus. Normal compressibility, respiratory phasicity and response to augmentation.  Radial Veins: No evidence of thrombus. Normal compressibility, respiratory phasicity and response to augmentation.  Ulnar Veins: No evidence of thrombus. Normal compressibility, respiratory phasicity and response to augmentation.  Venous Reflux:  None visualized.  Other Findings:  None visualized.  IMPRESSION: 1. No evidence of DVT within the right upper extremity. 2. Occlusive superficial thrombophlebitis involving the antecubital aspect of  the cephalic vein. Again, there is no extension of this superficial thrombophlebitis to the deep venous  system of the right upper extremity.   Electronically Signed   By: Sandi Mariscal M.D.   On: 03/15/2014 18:01  [2 weeks]   Assessment:  81 year old female admitted with aspiration pneumonia, supratherapeutic INR, N/V, 3 gram drop in Hgb without overt signs of GI bleeding. CT abd/pelvis without contrast without evidence of retroperitoneal bleed; however, it does note bilateral rectus sheath hematomas. Nausea improved with change in antibiotics, and patient is tolerating soft diet.   Anemia: Hgb stable, without overt signs of GI bleeding received 1 unit PRBCs with appropriate increase in Hgb.  Diarrhea: Cdiff PCR negative. Suspect antibiotic associated, GI pathogen panel ordered but not collected? Only one loose stool in last 24 hours.   Plan: 1. Continue PPI. Consider tapering Zofran to PRN. 2. Will follow peripherally.  Laureen Ochs. Bernarda Caffey [facility] [phone] 3/10/20169:24 AM     LOS: 7 days

## 2014-03-18 NOTE — Progress Notes (Signed)
Patient was discharged home prior to orders to make discharge appointments.  Appointment made with Dr. Nevada Crane for 03/24/14 at 9:00am and first appointment available for Cardiology on 04/07/14 at 10:40am.  Called and spoke with patient's daughter, Butch Penny, to inform her of the appointments.

## 2014-03-20 LAB — GI PATHOGEN PANEL BY PCR, STOOL
C difficile toxin A/B: NOT DETECTED
Campylobacter by PCR: NOT DETECTED
Cryptosporidium by PCR: NOT DETECTED
E COLI (ETEC) LT/ST: NOT DETECTED
E COLI (STEC): NOT DETECTED
E coli 0157 by PCR: NOT DETECTED
G LAMBLIA BY PCR: NOT DETECTED
NOROVIRUS G1/G2: NOT DETECTED
Rotavirus A by PCR: NOT DETECTED
SALMONELLA BY PCR: NOT DETECTED
Shigella by PCR: NOT DETECTED

## 2014-03-30 DIAGNOSIS — F329 Major depressive disorder, single episode, unspecified: Secondary | ICD-10-CM | POA: Diagnosis not present

## 2014-03-30 DIAGNOSIS — R5383 Other fatigue: Secondary | ICD-10-CM | POA: Diagnosis not present

## 2014-03-30 DIAGNOSIS — D649 Anemia, unspecified: Secondary | ICD-10-CM | POA: Diagnosis not present

## 2014-03-30 DIAGNOSIS — J189 Pneumonia, unspecified organism: Secondary | ICD-10-CM | POA: Diagnosis not present

## 2014-03-30 DIAGNOSIS — R531 Weakness: Secondary | ICD-10-CM | POA: Diagnosis not present

## 2014-04-07 ENCOUNTER — Ambulatory Visit (INDEPENDENT_AMBULATORY_CARE_PROVIDER_SITE_OTHER): Payer: Medicare Other | Admitting: Cardiovascular Disease

## 2014-04-07 ENCOUNTER — Other Ambulatory Visit: Payer: Self-pay | Admitting: Cardiovascular Disease

## 2014-04-07 ENCOUNTER — Telehealth: Payer: Self-pay | Admitting: Cardiovascular Disease

## 2014-04-07 ENCOUNTER — Encounter: Payer: Self-pay | Admitting: Cardiovascular Disease

## 2014-04-07 VITALS — BP 110/88 | HR 101 | Ht 64.0 in | Wt 122.0 lb

## 2014-04-07 DIAGNOSIS — Z5181 Encounter for therapeutic drug level monitoring: Secondary | ICD-10-CM

## 2014-04-07 DIAGNOSIS — I482 Chronic atrial fibrillation, unspecified: Secondary | ICD-10-CM

## 2014-04-07 DIAGNOSIS — I10 Essential (primary) hypertension: Secondary | ICD-10-CM

## 2014-04-07 DIAGNOSIS — Z9289 Personal history of other medical treatment: Secondary | ICD-10-CM

## 2014-04-07 DIAGNOSIS — Z87898 Personal history of other specified conditions: Secondary | ICD-10-CM

## 2014-04-07 DIAGNOSIS — E785 Hyperlipidemia, unspecified: Secondary | ICD-10-CM | POA: Diagnosis not present

## 2014-04-07 DIAGNOSIS — I4891 Unspecified atrial fibrillation: Secondary | ICD-10-CM | POA: Diagnosis not present

## 2014-04-07 DIAGNOSIS — D62 Acute posthemorrhagic anemia: Secondary | ICD-10-CM

## 2014-04-07 LAB — CBC
HEMATOCRIT: 40.3 % (ref 36.0–46.0)
HEMOGLOBIN: 13.1 g/dL (ref 12.0–15.0)
MCH: 30.3 pg (ref 26.0–34.0)
MCHC: 32.5 g/dL (ref 30.0–36.0)
MCV: 93.1 fL (ref 78.0–100.0)
MPV: 9.5 fL (ref 8.6–12.4)
Platelets: 326 10*3/uL (ref 150–400)
RBC: 4.33 MIL/uL (ref 3.87–5.11)
RDW: 14.9 % (ref 11.5–15.5)
WBC: 8.1 10*3/uL (ref 4.0–10.5)

## 2014-04-07 NOTE — Patient Instructions (Signed)
Your physician recommends that you schedule a follow-up appointment in: 3 months. Your physician recommends that you continue on your current medications as directed. Please refer to the Current Medication list given to you today. Your physician recommends that you have lab work done to check your CBC and INR.

## 2014-04-07 NOTE — Telephone Encounter (Signed)
Patient informed that no changes were made to any of her medications.

## 2014-04-07 NOTE — Telephone Encounter (Signed)
[name] needs to know if she is suppose to continue with the [facility].    Please call her at home.

## 2014-04-07 NOTE — Progress Notes (Signed)
Patient ID: Renee Cameron, female   DOB: [birth year], 81 y.o.   MRN: [id number]      SUBJECTIVE: Renee Cameron has a history of chronic atrial fibrillation and essential hypertension. When I last saw her on 03/02/14 she was doing well. She was subsequently hospitalized for a community acquired pneumonia and recently discharged. Her INR was noted to be markedly elevated at 9 on admission and warfarin was discontinued. She developed a significant drop in her hemoglobin to 3 g. A CT scan of the abdomen and pelvis indicated bilateral rectus sheath hematomas. She did receive one unit of packed red blood cells during hospitalization.  INR 1.25 and hemoglobin 9.4 on 03/18/14.  She also underwent a venous ultrasound of her right upper extremity for swelling. There was no evidence of DVT but she did have occlusive superficial thrombophlebitis involving the antecubital aspect of the cephalic vein.  She denies any symptoms of chest pain, palpitations, shortness of breath, lightheadedness, dizziness, leg swelling, abdominal pain, bleeding problems, orthopnea, PND, and syncope.   Soc: Her husband passed away 8 years ago. She has an adopted daughter who lives in the area.   Review of Systems: As per "subjective", otherwise negative.  No Known Allergies  Current Outpatient Prescriptions  Medication Sig Dispense Refill  . acidophilus (RISAQUAD) CAPS capsule Take 1 capsule by mouth daily. 30 capsule 0  . ALPRAZolam (XANAX) 0.5 MG tablet Take 0.5 mg by mouth at bedtime as needed for anxiety or sleep.     Marland Kitchen ascorbic Acid (VITAMIN C CR) 500 MG CPCR Take 500 mg by mouth daily.      Marland Kitchen atorvastatin (LIPITOR) 40 MG tablet Take 1 tablet (40 mg total) by mouth at bedtime. 30 tablet 6  . lisinopril (PRINIVIL,ZESTRIL) 20 MG tablet Take 1 tablet (20 mg total) by mouth daily. 90 tablet 3  . metoprolol succinate (TOPROL-XL) 25 MG 24 hr tablet TAKE 1 TABLET BY MOUTH EVERY DAY 30 tablet 6  . Multiple Vitamins-Minerals  (CENTRUM SILVER PO) Take 1 tablet by mouth daily.      . Multiple Vitamins-Minerals (EYE VITAMINS PO) Take 2 capsules by mouth daily.    . Omega-3 Fatty Acids (FISH OIL) 1000 MG CAPS Take 1 capsule by mouth daily.       No current facility-administered medications for this visit.    Past Medical History  Diagnosis Date  . Atrial fibrillation 1993    permanent; onset in 1993  . Hyperthyroidism 2000    2000  . Hypertension     unspecified  . Osteoporosis   . Chronic anticoagulation     Past Surgical History  Procedure Laterality Date  . Appendectomy    . Total abdominal hysterectomy w/ bilateral salpingoophorectomy    . Colonoscopy  2005    Dr. Laural Golden: few small diverticula. external hemorrhoids    History   Social History  . Marital Status: Widowed    Spouse Name: N/A  . Number of Children: N/A  . Years of Education: N/A   Occupational History  . Retired     [place] Topics  . Smoking status: Never Smoker   . Smokeless tobacco: Never Used  . Alcohol Use: No  . Drug Use: No  . Sexual Activity: Not on file   Other Topics Concern  . Not on file   Social History Narrative   Volunteers 2 days weekly     Filed Vitals:   04/07/14 1037  BP: 110/88  Pulse: 101  Height: 5\' 4"  (1.626 m)  Weight: 122 lb (55.339 kg)  SpO2: 98%    PHYSICAL EXAM General: NAD Neck: No JVD, no thyromegaly or thyroid nodule.  Lungs: Clear to auscultation bilaterally with normal respiratory effort. CV: Nondisplaced PMI. Irregular rhythm, normal rate, normal S1/S2, no significant murmur. No peripheral edema.Normal pedal pulses.  Abdomen: Soft, nontender, no hepatosplenomegaly, no distention.  Neurologic: Alert and oriented x 3.  Psych: Normal affect. Skin: Normal. Musculoskeletal: Normal range of motion, no gross deformities. Extremities: No clubbing or cyanosis.   ECG: Most recent ECG reviewed.      ASSESSMENT AND PLAN: 1.  Chronic atrial fibrillation: Heart rate is reasonably controlled on current dose of metoprolol, 90 bpm range. For the time being, I will not increase metoprolol. She is asymptomatic with respect to her arrhythmia. Will withhold anticoagulation given recent events. Will check CBC and INR. 2. Essential hypertension: Well controlled on lisinopril. No changes. 3. Hyperlipidemia: Maintained on Lipitor 40 mg. 4. Anemia: Will check CBC and INR.  Dispo: f/u 3 months.   Kate Sable, M.D., F.A.C.C.

## 2014-04-08 LAB — PROTIME-INR
INR: 1.06 (ref <1.50)
Prothrombin Time: 13.8 seconds (ref 11.6–15.2)

## 2014-04-09 ENCOUNTER — Telehealth: Payer: Self-pay | Admitting: *Deleted

## 2014-04-09 NOTE — Telephone Encounter (Signed)
Notes Recorded by Herminio Commons, MD on 04/08/2014 at 10:34 AM Normal CBC and INR.

## 2014-04-09 NOTE — Telephone Encounter (Signed)
Patient notified.  Will make Renee Cameron aware as she is scheduled for INR check on 04/12/2014.

## 2014-04-09 NOTE — Telephone Encounter (Addendum)
After further review of chart, patient's Coumadin was d/c'd at most recent hospital visit to [facility] on 04/07/2014 due to bleeding.  Dr. Bronson Ing was also in agreement with this given her recent event.  Therefore, her appointment with Lattie Haw will be cancelled.  Patient verbalized understanding.

## 2014-04-12 ENCOUNTER — Ambulatory Visit: Payer: Self-pay | Admitting: *Deleted

## 2014-04-12 DIAGNOSIS — G4700 Insomnia, unspecified: Secondary | ICD-10-CM | POA: Diagnosis not present

## 2014-04-12 DIAGNOSIS — F419 Anxiety disorder, unspecified: Secondary | ICD-10-CM | POA: Diagnosis not present

## 2014-04-12 DIAGNOSIS — Z6820 Body mass index (BMI) 20.0-20.9, adult: Secondary | ICD-10-CM | POA: Diagnosis not present

## 2014-04-12 DIAGNOSIS — I480 Paroxysmal atrial fibrillation: Secondary | ICD-10-CM

## 2014-04-12 DIAGNOSIS — Z79899 Other long term (current) drug therapy: Secondary | ICD-10-CM | POA: Diagnosis not present

## 2014-04-12 DIAGNOSIS — D509 Iron deficiency anemia, unspecified: Secondary | ICD-10-CM | POA: Diagnosis not present

## 2014-04-12 DIAGNOSIS — D649 Anemia, unspecified: Secondary | ICD-10-CM | POA: Diagnosis not present

## 2014-04-12 DIAGNOSIS — Z5181 Encounter for therapeutic drug level monitoring: Secondary | ICD-10-CM

## 2014-04-19 ENCOUNTER — Other Ambulatory Visit (HOSPITAL_COMMUNITY): Payer: Self-pay | Admitting: Internal Medicine

## 2014-04-19 ENCOUNTER — Ambulatory Visit (HOSPITAL_COMMUNITY)
Admission: RE | Admit: 2014-04-19 | Discharge: 2014-04-19 | Disposition: A | Discharge status: Home / Self Care | Payer: Medicare Other | Source: Ambulatory Visit | Attending: Internal Medicine | Admitting: Internal Medicine

## 2014-04-19 DIAGNOSIS — I10 Essential (primary) hypertension: Secondary | ICD-10-CM | POA: Diagnosis not present

## 2014-04-19 DIAGNOSIS — I517 Cardiomegaly: Secondary | ICD-10-CM | POA: Diagnosis not present

## 2014-04-19 DIAGNOSIS — F419 Anxiety disorder, unspecified: Secondary | ICD-10-CM | POA: Diagnosis not present

## 2014-04-19 DIAGNOSIS — J189 Pneumonia, unspecified organism: Secondary | ICD-10-CM | POA: Diagnosis not present

## 2014-04-19 DIAGNOSIS — J9811 Atelectasis: Secondary | ICD-10-CM | POA: Diagnosis not present

## 2014-04-19 DIAGNOSIS — R5383 Other fatigue: Secondary | ICD-10-CM | POA: Diagnosis not present

## 2014-06-28 ENCOUNTER — Ambulatory Visit: Payer: Medicare Other | Admitting: Cardiovascular Disease

## 2014-06-30 ENCOUNTER — Other Ambulatory Visit (HOSPITAL_COMMUNITY)
Admission: RE | Admit: 2014-06-30 | Discharge: 2014-06-30 | Disposition: A | Discharge status: Home / Self Care | Payer: Medicare Other | Source: Ambulatory Visit | Attending: Cardiovascular Disease | Admitting: Cardiovascular Disease

## 2014-06-30 ENCOUNTER — Telehealth: Payer: Self-pay

## 2014-06-30 ENCOUNTER — Ambulatory Visit (INDEPENDENT_AMBULATORY_CARE_PROVIDER_SITE_OTHER): Payer: Medicare Other | Admitting: Cardiovascular Disease

## 2014-06-30 VITALS — BP 126/80 | HR 81 | Ht 64.0 in | Wt 127.0 lb

## 2014-06-30 DIAGNOSIS — E785 Hyperlipidemia, unspecified: Secondary | ICD-10-CM | POA: Diagnosis not present

## 2014-06-30 DIAGNOSIS — Z79899 Other long term (current) drug therapy: Secondary | ICD-10-CM | POA: Diagnosis not present

## 2014-06-30 DIAGNOSIS — I482 Chronic atrial fibrillation, unspecified: Secondary | ICD-10-CM

## 2014-06-30 DIAGNOSIS — Z7189 Other specified counseling: Secondary | ICD-10-CM

## 2014-06-30 DIAGNOSIS — I10 Essential (primary) hypertension: Secondary | ICD-10-CM

## 2014-06-30 LAB — CBC WITH DIFFERENTIAL/PLATELET
BASOS ABS: 0 10*3/uL (ref 0.0–0.1)
BASOS PCT: 0 % (ref 0–1)
Eosinophils Absolute: 0.1 10*3/uL (ref 0.0–0.7)
Eosinophils Relative: 2 % (ref 0–5)
HCT: 42.9 % (ref 36.0–46.0)
Hemoglobin: 14 g/dL (ref 12.0–15.0)
LYMPHS ABS: 2.2 10*3/uL (ref 0.7–4.0)
Lymphocytes Relative: 32 % (ref 12–46)
MCH: 30.5 pg (ref 26.0–34.0)
MCHC: 32.6 g/dL (ref 30.0–36.0)
MCV: 93.5 fL (ref 78.0–100.0)
Monocytes Absolute: 0.6 10*3/uL (ref 0.1–1.0)
Monocytes Relative: 9 % (ref 3–12)
Neutro Abs: 4 10*3/uL (ref 1.7–7.7)
Neutrophils Relative %: 57 % (ref 43–77)
Platelets: 177 10*3/uL (ref 150–400)
RBC: 4.59 MIL/uL (ref 3.87–5.11)
RDW: 13.8 % (ref 11.5–15.5)
WBC: 6.9 10*3/uL (ref 4.0–10.5)

## 2014-06-30 MED ORDER — RIVAROXABAN 15 MG PO TABS: 15.0000 mg | ORAL_TABLET | Freq: Every day | ORAL | Status: DC

## 2014-06-30 NOTE — Telephone Encounter (Signed)
-----   Message from Herminio Commons, MD sent at 06/30/2014 11:35 AM EDT ----- Normal Hgb. Will need repeat CBC (without diff) after being on Xarelto for 10 days.

## 2014-06-30 NOTE — Patient Instructions (Signed)
Your physician recommends that you schedule a follow-up appointment in: 2 MONTHS with Dr. Bronson Ing  Your physician recommends that you return for lab work in: 7 -10 days after starting Xarelto CBC   Start Xarelto 15 mg daily ( TAKE 1 TABLET DAILY WITH SUPPER)  Thanks for choosing [facility]!!!

## 2014-06-30 NOTE — Progress Notes (Signed)
Patient ID: Renee Cameron, female   DOB: 1924/07/09, 81 y.o.   MRN: [id number]      SUBJECTIVE: Renee Cameron has a history of chronic atrial fibrillation and essential hypertension. She has a history of bilateral rectus sheath hematomas while on warfarin with a markedly supratherapeutic INR, and it was subsequently discontinued.  She also has a history of occlusive superficial thrombophlebitis involving the antecubital aspect of the cephalic vein.  She denies any symptoms of chest pain, palpitations, shortness of breath, lightheadedness, dizziness, leg swelling, abdominal pain, bleeding problems, orthopnea, PND, and syncope.  She wants to get back to exercising in the pool.   Soc: Her husband passed away 8 years ago. She has an adopted daughter who lives in the area.   Review of Systems: As per "subjective", otherwise negative.  No Known Allergies  Current Outpatient Prescriptions  Medication Sig Dispense Refill  . ALPRAZolam (XANAX) 0.5 MG tablet Take 0.5 mg by mouth at bedtime as needed for anxiety or sleep.     Marland Kitchen atorvastatin (LIPITOR) 40 MG tablet Take 1 tablet (40 mg total) by mouth at bedtime. 30 tablet 6  . lisinopril (PRINIVIL,ZESTRIL) 20 MG tablet Take 1 tablet (20 mg total) by mouth daily. 90 tablet 3  . metoprolol succinate (TOPROL-XL) 25 MG 24 hr tablet TAKE 1 TABLET BY MOUTH EVERY DAY 30 tablet 6  . Multiple Vitamins-Minerals (CENTRUM SILVER PO) Take 1 tablet by mouth daily.      . Multiple Vitamins-Minerals (EYE VITAMINS PO) Take 2 capsules by mouth daily.    . Omega-3 Fatty Acids (FISH OIL) 1000 MG CAPS Take 1 capsule by mouth daily.       No current facility-administered medications for this visit.    Past Medical History  Diagnosis Date  . Atrial fibrillation 1993    permanent; onset in 1993  . Hyperthyroidism 2000    2000  . Hypertension     unspecified  . Osteoporosis   . Chronic anticoagulation     Past Surgical History  Procedure Laterality Date  .  Appendectomy    . Total abdominal hysterectomy w/ bilateral salpingoophorectomy    . Colonoscopy  2005    Dr. Laural Golden: few small diverticula. external hemorrhoids    History   Social History  . Marital Status: Widowed    Spouse Name: N/A  . Number of Children: N/A  . Years of Education: N/A   Occupational History  . Retired     [place] Topics  . Smoking status: Never Smoker   . Smokeless tobacco: Never Used  . Alcohol Use: No  . Drug Use: No  . Sexual Activity: Not on file   Other Topics Concern  . Not on file   Social History Narrative   Volunteers 2 days weekly     Filed Vitals:   06/30/14 0908  BP: 126/80  Pulse: 81  Height: 5\' 4"  (1.626 m)  Weight: 127 lb (57.607 kg)  SpO2: 97%    PHYSICAL EXAM General: NAD Neck: No JVD, no thyromegaly or thyroid nodule.  Lungs: Clear to auscultation bilaterally with normal respiratory effort. CV: Nondisplaced PMI. Irregular rhythm, normal rate, normal S1/S2, no significant murmur. No peripheral edema.Normal pedal pulses.  Abdomen: Soft, nontender, no hepatosplenomegaly, no distention.  Neurologic: Alert and oriented x 3.  Psych: Normal affect. Skin: Normal. Musculoskeletal: Normal range of motion, no gross deformities. Extremities: No clubbing or cyanosis.    ECG: Most recent ECG reviewed.  ASSESSMENT AND PLAN: 1. Chronic atrial fibrillation: Heart rate is reasonably controlled on current dose of metoprolol. She is asymptomatic with respect to her arrhythmia. She has had no recurrence of bleeding problems. CHADSVASC score is 4, thus putting her at a high thromboembolic risk.  I will cautiously initiate a low-dose of Xarelto 15 mg daily and check a CBC within 7-10 days. I have instructed her to stop it immediately if she were to develop bleeding problems.  2. Essential hypertension: Well controlled on lisinopril. No changes.  3. Hyperlipidemia: Maintained on  Lipitor 40 mg.  Dispo: f/u 2 months.   Kate Sable, M.D., F.A.C.C.

## 2014-07-08 ENCOUNTER — Other Ambulatory Visit (HOSPITAL_COMMUNITY)
Admission: RE | Admit: 2014-07-08 | Discharge: 2014-07-08 | Disposition: A | Discharge status: Home / Self Care | Payer: Medicare Other | Source: Ambulatory Visit | Attending: Cardiovascular Disease | Admitting: Cardiovascular Disease

## 2014-07-08 DIAGNOSIS — Z5181 Encounter for therapeutic drug level monitoring: Secondary | ICD-10-CM | POA: Insufficient documentation

## 2014-07-08 DIAGNOSIS — Z79899 Other long term (current) drug therapy: Secondary | ICD-10-CM | POA: Diagnosis not present

## 2014-07-08 LAB — PROTIME-INR
INR: 1.43 (ref 0.00–1.49)
Prothrombin Time: 17.5 seconds — ABNORMAL HIGH (ref 11.6–15.2)

## 2014-07-08 LAB — CBC WITH DIFFERENTIAL/PLATELET
Basophils Absolute: 0 10*3/uL (ref 0.0–0.1)
Basophils Relative: 0 % (ref 0–1)
EOS ABS: 0.1 10*3/uL (ref 0.0–0.7)
EOS PCT: 1 % (ref 0–5)
HEMATOCRIT: 42.7 % (ref 36.0–46.0)
Hemoglobin: 14.1 g/dL (ref 12.0–15.0)
Lymphocytes Relative: 26 % (ref 12–46)
Lymphs Abs: 1.6 10*3/uL (ref 0.7–4.0)
MCH: 30.6 pg (ref 26.0–34.0)
MCHC: 33 g/dL (ref 30.0–36.0)
MCV: 92.6 fL (ref 78.0–100.0)
MONO ABS: 0.4 10*3/uL (ref 0.1–1.0)
Monocytes Relative: 7 % (ref 3–12)
NEUTROS ABS: 4.2 10*3/uL (ref 1.7–7.7)
NEUTROS PCT: 66 % (ref 43–77)
Platelets: 174 10*3/uL (ref 150–400)
RBC: 4.61 MIL/uL (ref 3.87–5.11)
RDW: 13.8 % (ref 11.5–15.5)
WBC: 6.3 10*3/uL (ref 4.0–10.5)

## 2014-08-30 ENCOUNTER — Other Ambulatory Visit: Payer: Self-pay | Admitting: *Deleted

## 2014-08-30 MED ORDER — LISINOPRIL 20 MG PO TABS: 20.0000 mg | ORAL_TABLET | Freq: Every day | ORAL | Status: DC

## 2014-08-31 ENCOUNTER — Ambulatory Visit: Payer: Medicare Other | Admitting: Cardiovascular Disease

## 2014-09-08 ENCOUNTER — Encounter: Payer: Self-pay | Admitting: Cardiovascular Disease

## 2014-09-08 ENCOUNTER — Ambulatory Visit (INDEPENDENT_AMBULATORY_CARE_PROVIDER_SITE_OTHER): Payer: Medicare Other | Admitting: Cardiovascular Disease

## 2014-09-08 VITALS — BP 122/74 | HR 99 | Ht 63.0 in | Wt 129.8 lb

## 2014-09-08 DIAGNOSIS — I10 Essential (primary) hypertension: Secondary | ICD-10-CM

## 2014-09-08 DIAGNOSIS — I482 Chronic atrial fibrillation, unspecified: Secondary | ICD-10-CM

## 2014-09-08 DIAGNOSIS — E785 Hyperlipidemia, unspecified: Secondary | ICD-10-CM

## 2014-09-08 DIAGNOSIS — Z7189 Other specified counseling: Secondary | ICD-10-CM

## 2014-09-08 NOTE — Patient Instructions (Signed)
Your physician wants you to follow-up in: 6 months with Dr Virgina Jock will receive a reminder letter in the mail two months in advance. If you don't receive a letter, please call our office to schedule the follow-up appointment.    Your physician recommends that you continue on your current medications as directed. Please refer to the Current Medication list given to you today.    Thank you for choosing [facility] !

## 2014-09-08 NOTE — Progress Notes (Signed)
Patient ID: Renee Cameron, female   DOB: 21-May-1924, 81 y.o.   MRN: [id number]      SUBJECTIVE: Mrs. Weier has a history of chronic atrial fibrillation and essential hypertension. She has a history of bilateral rectus sheath hematomas while on warfarin with a markedly supratherapeutic INR, and it was subsequently discontinued.  She also has a history of occlusive superficial thrombophlebitis involving the antecubital aspect of the cephalic vein.  She denies any symptoms of chest pain, palpitations, shortness of breath, lightheadedness, dizziness, leg swelling, abdominal pain, bleeding problems, orthopnea, PND, and syncope.  She is walking 3 days per week and feels very well.   Soc: Her husband passed away 8 years ago. She has an adopted daughter who lives in the area.   Review of Systems: As per "subjective", otherwise negative.  No Known Allergies  Current Outpatient Prescriptions  Medication Sig Dispense Refill  . ALPRAZolam (XANAX) 0.5 MG tablet Take 0.5 mg by mouth at bedtime as needed for anxiety or sleep.     Marland Kitchen atorvastatin (LIPITOR) 40 MG tablet Take 1 tablet (40 mg total) by mouth at bedtime. 30 tablet 6  . lisinopril (PRINIVIL,ZESTRIL) 20 MG tablet Take 1 tablet (20 mg total) by mouth daily. 90 tablet 3  . metoprolol succinate (TOPROL-XL) 25 MG 24 hr tablet TAKE 1 TABLET BY MOUTH EVERY DAY 30 tablet 6  . Multiple Vitamins-Minerals (CENTRUM SILVER PO) Take 1 tablet by mouth daily.      . Multiple Vitamins-Minerals (EYE VITAMINS PO) Take 2 capsules by mouth daily.    . Omega-3 Fatty Acids (FISH OIL) 1000 MG CAPS Take 1 capsule by mouth daily.      . Rivaroxaban (XARELTO) 15 MG TABS tablet Take 1 tablet (15 mg total) by mouth daily with supper. 30 tablet 3   No current facility-administered medications for this visit.    Past Medical History  Diagnosis Date  . Atrial fibrillation 1993    permanent; onset in 1993  . Hyperthyroidism 2000    2000  . Hypertension    unspecified  . Osteoporosis   . Chronic anticoagulation     Past Surgical History  Procedure Laterality Date  . Appendectomy    . Total abdominal hysterectomy w/ bilateral salpingoophorectomy    . Colonoscopy  2005    Dr. Laural Golden: few small diverticula. external hemorrhoids    Social History   Social History  . Marital Status: Widowed    Spouse Name: N/A  . Number of Children: N/A  . Years of Education: N/A   Occupational History  . Retired     [place] Topics  . Smoking status: Never Smoker   . Smokeless tobacco: Never Used  . Alcohol Use: No  . Drug Use: No  . Sexual Activity: Not on file   Other Topics Concern  . Not on file   Social History Narrative   Volunteers 2 days weekly     Filed Vitals:   09/08/14 0828  BP: 122/74  Pulse: 99  Height: 5\' 3"  (1.6 m)  Weight: 129 lb 12.8 oz (58.877 kg)  SpO2: 96%    PHYSICAL EXAM General: NAD Neck: No JVD, no thyromegaly or thyroid nodule.  Lungs: Clear to auscultation bilaterally with normal respiratory effort. CV: Nondisplaced PMI. Irregular rhythm, normal rate, normal S1/S2, no significant murmur. No peripheral edema.Normal pedal pulses.  Abdomen: Soft, nontender, no hepatosplenomegaly, no distention.  Neurologic: Alert and oriented x 3.  Psych: Normal affect.  Skin: Normal. Musculoskeletal: Normal range of motion, no gross deformities. Extremities: No clubbing or cyanosis.   ECG: Most recent ECG reviewed.      ASSESSMENT AND PLAN: 1. Chronic atrial fibrillation: Heart rate is reasonably controlled on current dose of metoprolol. She is asymptomatic with respect to her arrhythmia. She has had no recurrence of bleeding problems. CHADSVASC score is 4, thus putting her at a high thromboembolic risk.  I will continue low-dose Xarelto 15 mg daily. Hgb stable at 14.1 on 07/08/14. I have instructed her to stop it immediately if she were to develop bleeding  problems.  2. Essential hypertension: Well controlled on lisinopril. No changes.  3. Hyperlipidemia: Maintained on Lipitor 40 mg.  Dispo: f/u 6 months.    Kate Sable, M.D., F.A.C.C.

## 2014-09-15 ENCOUNTER — Other Ambulatory Visit: Payer: Self-pay | Admitting: *Deleted

## 2014-09-15 MED ORDER — ATORVASTATIN CALCIUM 40 MG PO TABS: 40.0000 mg | ORAL_TABLET | Freq: Every day | ORAL | Status: DC

## 2014-11-15 ENCOUNTER — Other Ambulatory Visit: Payer: Self-pay | Admitting: Cardiovascular Disease

## 2014-11-20 ENCOUNTER — Other Ambulatory Visit: Payer: Self-pay | Admitting: Cardiovascular Disease

## 2014-11-23 DIAGNOSIS — Z23 Encounter for immunization: Secondary | ICD-10-CM | POA: Diagnosis not present

## 2015-01-13 ENCOUNTER — Other Ambulatory Visit: Payer: Self-pay

## 2015-01-13 MED ORDER — RIVAROXABAN 15 MG PO TABS: 15.0000 mg | ORAL_TABLET | Freq: Every day | ORAL | Status: DC

## 2015-01-13 NOTE — Telephone Encounter (Signed)
Refill xarelto complete

## 2015-03-14 ENCOUNTER — Ambulatory Visit (INDEPENDENT_AMBULATORY_CARE_PROVIDER_SITE_OTHER): Payer: Medicare Other | Admitting: Cardiovascular Disease

## 2015-03-14 ENCOUNTER — Encounter: Payer: Self-pay | Admitting: Cardiovascular Disease

## 2015-03-14 VITALS — BP 106/70 | HR 76 | Ht 64.0 in | Wt 128.0 lb

## 2015-03-14 DIAGNOSIS — I482 Chronic atrial fibrillation, unspecified: Secondary | ICD-10-CM

## 2015-03-14 DIAGNOSIS — E785 Hyperlipidemia, unspecified: Secondary | ICD-10-CM | POA: Diagnosis not present

## 2015-03-14 DIAGNOSIS — Z5181 Encounter for therapeutic drug level monitoring: Secondary | ICD-10-CM

## 2015-03-14 DIAGNOSIS — I10 Essential (primary) hypertension: Secondary | ICD-10-CM

## 2015-03-14 NOTE — Patient Instructions (Signed)
Your physician wants you to follow-up in:  1 year with Dr Koneswaran You will receive a reminder letter in the mail two months in advance. If you don't receive a letter, please call our office to schedule the follow-up appointment.    Your physician recommends that you continue on your current medications as directed. Please refer to the Current Medication list given to you today.    If you need a refill on your cardiac medications before your next appointment, please call your pharmacy.     Thank you for choosing [facility] !        

## 2015-03-14 NOTE — Progress Notes (Signed)
Patient ID: Renee Cameron, female   DOB: 29-Sep-1924, 82 y.o.   MRN: [id number]      SUBJECTIVE: Renee Cameron has a history of chronic atrial fibrillation and essential hypertension. She has a history of bilateral rectus sheath hematomas while on warfarin with a markedly supratherapeutic INR, and it was subsequently discontinued.  She also has a history of occlusive superficial thrombophlebitis involving the antecubital aspect of the cephalic vein.  She denies any symptoms of chest pain, palpitations, shortness of breath, lightheadedness, dizziness, leg swelling, abdominal pain, bleeding problems, orthopnea, PND, and syncope.    Review of Systems: As per "subjective", otherwise negative.  No Known Allergies  Current Outpatient Prescriptions  Medication Sig Dispense Refill  . ALPRAZolam (XANAX) 0.5 MG tablet Take 0.5 mg by mouth at bedtime as needed for anxiety or sleep.     Marland Kitchen atorvastatin (LIPITOR) 40 MG tablet Take 1 tablet (40 mg total) by mouth at bedtime. 30 tablet 6  . lisinopril (PRINIVIL,ZESTRIL) 20 MG tablet Take 1 tablet (20 mg total) by mouth daily. 90 tablet 3  . metoprolol succinate (TOPROL-XL) 25 MG 24 hr tablet TAKE 1 TABLET BY MOUTH EVERY DAY 30 tablet 6  . Multiple Vitamins-Minerals (CENTRUM SILVER PO) Take 1 tablet by mouth daily.      . Multiple Vitamins-Minerals (EYE VITAMINS PO) Take 2 capsules by mouth daily.    . Omega-3 Fatty Acids (FISH OIL) 1000 MG CAPS Take 1 capsule by mouth daily.      . Rivaroxaban (XARELTO) 15 MG TABS tablet Take 1 tablet (15 mg total) by mouth daily with supper. 30 tablet 6   No current facility-administered medications for this visit.    Past Medical History  Diagnosis Date  . Atrial fibrillation ([place]) 1993    permanent; onset in 1993  . Hyperthyroidism 2000    2000  . Hypertension     unspecified  . Osteoporosis   . Chronic anticoagulation     Past Surgical History  Procedure Laterality Date  . Appendectomy    . Total  abdominal hysterectomy w/ bilateral salpingoophorectomy    . Colonoscopy  2005    Dr. Laural Golden: few small diverticula. external hemorrhoids    Social History   Social History  . Marital Status: Widowed    Spouse Name: N/A  . Number of Children: N/A  . Years of Education: N/A   Occupational History  . Retired     [place] Topics  . Smoking status: Never Smoker   . Smokeless tobacco: Never Used  . Alcohol Use: No  . Drug Use: No  . Sexual Activity: Not on file   Other Topics Concern  . Not on file   Social History Narrative   Volunteers 2 days weekly     Filed Vitals:   03/14/15 1257  BP: 106/70  Pulse: 76  Height: 5\' 4"  (1.626 m)  Weight: 128 lb (58.06 kg)  SpO2: 95%    PHYSICAL EXAM General: NAD Neck: No JVD, no thyromegaly or thyroid nodule.  Lungs: Clear to auscultation bilaterally with normal respiratory effort. CV: Nondisplaced PMI. Irregular rhythm, normal rate, normal S1/S2, no significant murmur. No peripheral edema.Normal pedal pulses.  Abdomen: Soft, nontender, no distention.  Neurologic: Alert and oriented x 3.  Psych: Normal affect. Skin: Normal. Musculoskeletal: Normal range of motion, no gross deformities. Extremities: No clubbing or cyanosis.   ECG: Most recent ECG reviewed.      ASSESSMENT AND PLAN: 1. Chronic  atrial fibrillation: Heart rate is well controlled on current dose of metoprolol. She is asymptomatic with respect to her arrhythmia. She has had no recurrence of bleeding problems. CHADSVASC score is 4, thus putting her at a high thromboembolic risk.  I will continue low-dose Xarelto 15 mg daily. I have instructed her to stop it immediately if she were to develop bleeding problems.  2. Essential hypertension: Well controlled on lisinopril. No changes.  3. Hyperlipidemia: Maintained on Lipitor 40 mg.  Dispo: f/u 1 year.  Kate Sable, M.D., F.A.C.C.

## 2015-03-18 DIAGNOSIS — H524 Presbyopia: Secondary | ICD-10-CM | POA: Diagnosis not present

## 2015-03-18 DIAGNOSIS — H3530 Unspecified macular degeneration: Secondary | ICD-10-CM | POA: Diagnosis not present

## 2015-03-18 DIAGNOSIS — H52222 Regular astigmatism, left eye: Secondary | ICD-10-CM | POA: Diagnosis not present

## 2015-03-18 DIAGNOSIS — H5203 Hypermetropia, bilateral: Secondary | ICD-10-CM | POA: Diagnosis not present

## 2015-05-05 DIAGNOSIS — I10 Essential (primary) hypertension: Secondary | ICD-10-CM | POA: Diagnosis not present

## 2015-05-05 DIAGNOSIS — E782 Mixed hyperlipidemia: Secondary | ICD-10-CM | POA: Diagnosis not present

## 2015-05-05 DIAGNOSIS — R7301 Impaired fasting glucose: Secondary | ICD-10-CM | POA: Diagnosis not present

## 2015-05-10 DIAGNOSIS — I10 Essential (primary) hypertension: Secondary | ICD-10-CM | POA: Diagnosis not present

## 2015-05-10 DIAGNOSIS — E782 Mixed hyperlipidemia: Secondary | ICD-10-CM | POA: Diagnosis not present

## 2015-05-10 DIAGNOSIS — I482 Chronic atrial fibrillation: Secondary | ICD-10-CM | POA: Diagnosis not present

## 2015-05-13 ENCOUNTER — Other Ambulatory Visit: Payer: Self-pay | Admitting: Cardiovascular Disease

## 2015-06-18 ENCOUNTER — Other Ambulatory Visit: Payer: Self-pay | Admitting: Cardiovascular Disease

## 2015-08-22 ENCOUNTER — Other Ambulatory Visit: Payer: Self-pay | Admitting: Cardiovascular Disease

## 2015-08-30 ENCOUNTER — Other Ambulatory Visit: Payer: Self-pay | Admitting: Cardiovascular Disease

## 2015-08-30 MED ORDER — LISINOPRIL 20 MG PO TABS: 20.0000 mg | ORAL_TABLET | Freq: Every day | ORAL | 3 refills | Status: DC

## 2015-08-30 NOTE — Telephone Encounter (Signed)
Refilled lisinopril

## 2015-09-09 ENCOUNTER — Other Ambulatory Visit: Payer: Self-pay

## 2015-09-26 DIAGNOSIS — R7301 Impaired fasting glucose: Secondary | ICD-10-CM | POA: Diagnosis not present

## 2015-09-26 DIAGNOSIS — E782 Mixed hyperlipidemia: Secondary | ICD-10-CM | POA: Diagnosis not present

## 2015-09-26 DIAGNOSIS — I10 Essential (primary) hypertension: Secondary | ICD-10-CM | POA: Diagnosis not present

## 2015-09-28 DIAGNOSIS — R7301 Impaired fasting glucose: Secondary | ICD-10-CM | POA: Diagnosis not present

## 2015-09-28 DIAGNOSIS — I10 Essential (primary) hypertension: Secondary | ICD-10-CM | POA: Diagnosis not present

## 2015-09-28 DIAGNOSIS — Z0000 Encounter for general adult medical examination without abnormal findings: Secondary | ICD-10-CM | POA: Diagnosis not present

## 2015-09-28 DIAGNOSIS — I482 Chronic atrial fibrillation: Secondary | ICD-10-CM | POA: Diagnosis not present

## 2015-09-28 DIAGNOSIS — E875 Hyperkalemia: Secondary | ICD-10-CM | POA: Diagnosis not present

## 2015-09-28 DIAGNOSIS — E782 Mixed hyperlipidemia: Secondary | ICD-10-CM | POA: Diagnosis not present

## 2015-09-28 DIAGNOSIS — Z23 Encounter for immunization: Secondary | ICD-10-CM | POA: Diagnosis not present

## 2016-01-29 ENCOUNTER — Encounter (HOSPITAL_COMMUNITY): Payer: Self-pay | Admitting: Cardiology

## 2016-01-29 ENCOUNTER — Emergency Department (HOSPITAL_COMMUNITY): Payer: Medicare Other

## 2016-01-29 ENCOUNTER — Emergency Department (HOSPITAL_COMMUNITY)
Admission: EM | Admit: 2016-01-29 | Discharge: 2016-01-29 | Disposition: A | Discharge status: Home / Self Care | Payer: Medicare Other | Attending: Emergency Medicine | Admitting: Emergency Medicine

## 2016-01-29 DIAGNOSIS — J069 Acute upper respiratory infection, unspecified: Secondary | ICD-10-CM

## 2016-01-29 DIAGNOSIS — I10 Essential (primary) hypertension: Secondary | ICD-10-CM | POA: Diagnosis not present

## 2016-01-29 DIAGNOSIS — Z79899 Other long term (current) drug therapy: Secondary | ICD-10-CM | POA: Diagnosis not present

## 2016-01-29 DIAGNOSIS — R05 Cough: Secondary | ICD-10-CM | POA: Diagnosis not present

## 2016-01-29 DIAGNOSIS — B9789 Other viral agents as the cause of diseases classified elsewhere: Secondary | ICD-10-CM

## 2016-01-29 DIAGNOSIS — R079 Chest pain, unspecified: Secondary | ICD-10-CM | POA: Diagnosis not present

## 2016-01-29 LAB — BASIC METABOLIC PANEL
ANION GAP: 11 (ref 5–15)
BUN: 13 mg/dL (ref 6–20)
CALCIUM: 9.4 mg/dL (ref 8.9–10.3)
CO2: 23 mmol/L (ref 22–32)
Chloride: 99 mmol/L — ABNORMAL LOW (ref 101–111)
Creatinine, Ser: 0.69 mg/dL (ref 0.44–1.00)
GFR calc Af Amer: >60 mL/min (ref >60)
GFR calc non Af Amer: >60 mL/min (ref >60)
GLUCOSE: 115 mg/dL — AB (ref 65–99)
Potassium: 3.7 mmol/L (ref 3.5–5.1)
Sodium: 133 mmol/L — ABNORMAL LOW (ref 135–145)

## 2016-01-29 LAB — CBC WITH DIFFERENTIAL/PLATELET
BASOS ABS: 0 10*3/uL (ref 0.0–0.1)
Basophils Relative: 0 %
Eosinophils Absolute: 0 10*3/uL (ref 0.0–0.7)
Eosinophils Relative: 1 %
HEMATOCRIT: 43 % (ref 36.0–46.0)
Hemoglobin: 14.4 g/dL (ref 12.0–15.0)
LYMPHS ABS: 1.5 10*3/uL (ref 0.7–4.0)
Lymphocytes Relative: 25 %
MCH: 30.1 pg (ref 26.0–34.0)
MCHC: 33.5 g/dL (ref 30.0–36.0)
MCV: 90 fL (ref 78.0–100.0)
Monocytes Absolute: 0.5 10*3/uL (ref 0.1–1.0)
Monocytes Relative: 8 %
NEUTROS ABS: 4 10*3/uL (ref 1.7–7.7)
Neutrophils Relative %: 66 %
Platelets: 222 10*3/uL (ref 150–400)
RBC: 4.78 MIL/uL (ref 3.87–5.11)
RDW: 13 % (ref 11.5–15.5)
WBC: 6.1 10*3/uL (ref 4.0–10.5)

## 2016-01-29 LAB — I-STAT TROPONIN, ED: Troponin i, poc: 0.02 ng/mL (ref 0.00–0.08)

## 2016-01-29 LAB — I-STAT CG4 LACTIC ACID, ED: LACTIC ACID, VENOUS: 1.45 mmol/L (ref 0.5–1.9)

## 2016-01-29 MED ORDER — SODIUM CHLORIDE 0.9 % IV SOLN
Freq: Once | INTRAVENOUS | Status: AC
  Administered 2016-01-29: 09:00:00 via INTRAVENOUS

## 2016-01-29 MED ORDER — SODIUM CHLORIDE 0.9 % IV BOLUS (SEPSIS)
500.0000 mL | Freq: Once | INTRAVENOUS | Status: AC
  Administered 2016-01-29: 500 mL via INTRAVENOUS

## 2016-01-29 MED ORDER — OSELTAMIVIR PHOSPHATE 75 MG PO CAPS
75.0000 mg | ORAL_CAPSULE | Freq: Once | ORAL | Status: AC
  Administered 2016-01-29: 75 mg via ORAL
  Filled 2016-01-29: qty 1

## 2016-01-29 MED ORDER — OSELTAMIVIR PHOSPHATE 75 MG PO CAPS: 75.0000 mg | ORAL_CAPSULE | Freq: Two times a day (BID) | ORAL | 0 refills | Status: DC

## 2016-01-29 NOTE — Discharge Instructions (Signed)
Take Tylenol for any fever or body aches. Continue over-the-counter cough medication. Take Tamiflu as prescribed until all gone. Rest, drink plenty of fluids, return to emergency department if worsening symptoms.

## 2016-01-29 NOTE — ED Provider Notes (Signed)
[facility] DEPT Provider Note   CSN: [id number] Arrival date & time: 01/29/16  [id number]     History   Chief Complaint Chief Complaint  Patient presents with  . Cough    HPI Renee Cameron is a 83 y.o. female.  HPI Renee Cameron is a 83 y.o. female with hx of afib, on xarelto, HTN, hyperthyroidism, presents to ED with complaint of cough, Weakness, nausea, decreased appetite for about 3 days. Patient lives at home alone, patient's daughter lives close by. Patient's daughter states the patient has been coughing, having some nasal congestion, nausea, and a few loose stools. Denies any vomiting. No fever. She has been taking Delsym cough medication to off with cough and states it has been somewhat helpful. She denies any chest pain or shortness of breath. She denies abdominal pain. No recent ill contacts. Reports having her flu shot this year. She states she has not eaten much yesterday, but reports drinking fluids. Reports generalized weakness and malaise. Reports history of pneumonia and states it feels the same.  Past Medical History:  Diagnosis Date  . Atrial fibrillation ([place]) 1993   permanent; onset in 1993  . Chronic anticoagulation   . Hypertension    unspecified  . Hyperthyroidism 2000   2000  . Osteoporosis     Patient Active Problem List   Diagnosis Date Noted  . Abdominal pain in female   . Diarrhea   . Rectus sheath hematoma   . Acute blood loss anemia 03/14/2014  . Bruise, trunk 03/14/2014  . Vomiting 03/11/2014  . CAP (community acquired pneumonia) 03/11/2014  . Hyponatremia 03/11/2014  . Nausea   . Encounter for therapeutic drug monitoring 02/05/2013  . Atrial fibrillation ([place])   . Hyperthyroidism   . Hypertension   . Chronic anticoagulation     Past Surgical History:  Procedure Laterality Date  . APPENDECTOMY    . COLONOSCOPY  2005   Dr. Laural Golden: few small diverticula. external hemorrhoids  . TOTAL ABDOMINAL HYSTERECTOMY W/ BILATERAL  SALPINGOOPHORECTOMY      OB History    No data available       Home Medications    Prior to Admission medications   Medication Sig Start Date End Date Taking? Authorizing Provider  ALPRAZolam Duanne Moron) 0.5 MG tablet Take 0.5 mg by mouth at bedtime as needed for anxiety or sleep.     Historical Provider, MD  atorvastatin (LIPITOR) 40 MG tablet TAKE 1 TABLET BY MOUTH AT BEDTIME 06/20/15   Herminio Commons, MD  lisinopril (PRINIVIL,ZESTRIL) 20 MG tablet Take 1 tablet (20 mg total) by mouth daily. 08/30/15   Herminio Commons, MD  metoprolol succinate (TOPROL-XL) 25 MG 24 hr tablet TAKE 1 TABLET BY MOUTH EVERY DAY 11/15/14   Herminio Commons, MD  metoprolol succinate (TOPROL-XL) 25 MG 24 hr tablet TAKE 1 TABLET BY MOUTH EVERY DAY 05/13/15   Herminio Commons, MD  Multiple Vitamins-Minerals (CENTRUM SILVER PO) Take 1 tablet by mouth daily.      Historical Provider, MD  Multiple Vitamins-Minerals (EYE VITAMINS PO) Take 2 capsules by mouth daily.    Historical Provider, MD  Omega-3 Fatty Acids (FISH OIL) 1000 MG CAPS Take 1 capsule by mouth daily.      Historical Provider, MD  XARELTO 15 MG TABS tablet TAKE 1 TABLET(15 MG) BY MOUTH DAILY WITH DINNER 08/22/15   Herminio Commons, MD    Family History Family History  Problem Relation Age of Onset  . Prostate cancer  Father   . Stroke Father   . Heart disease Mother   . Coronary artery disease Sister     with prior CABG surgery    Social History Social History  Substance Use Topics  . Smoking status: Never Smoker  . Smokeless tobacco: Never Used  . Alcohol use No     Allergies   Patient has no known allergies.   Review of Systems Review of Systems  Constitutional: Positive for activity change, appetite change and fatigue. Negative for chills and fever.  HENT: Positive for congestion. Negative for sore throat and trouble swallowing.   Respiratory: Positive for cough. Negative for chest tightness and shortness of breath.     Cardiovascular: Negative for chest pain, palpitations and leg swelling.  Gastrointestinal: Positive for diarrhea and nausea. Negative for abdominal pain and vomiting.  Genitourinary: Negative for dysuria, flank pain and pelvic pain.  Musculoskeletal: Positive for myalgias. Negative for arthralgias, neck pain and neck stiffness.  Skin: Negative for rash.  Neurological: Positive for weakness. Negative for dizziness and headaches.  All other systems reviewed and are negative.    Physical Exam Updated Vital Signs BP (!) 180/102 (BP Location: Left Arm)   Pulse 100   Temp 98.2 F (36.8 C) (Oral)   Resp 24   Ht 5\' 5"  (1.651 m)   Wt 56.7 kg   SpO2 97%   BMI 20.80 kg/m   Physical Exam  Constitutional: She is oriented to person, place, and time. She appears well-developed and well-nourished. No distress.  HENT:  Head: Normocephalic.  Right Ear: External ear normal.  Left Ear: External ear normal.  Nose: Nose normal.  Mouth/Throat: Oropharynx is clear and moist.  Eyes: Conjunctivae are normal.  Neck: Normal range of motion. Neck supple.  No meningismus  Cardiovascular: Normal heart sounds.  An irregular rhythm present.  Pulmonary/Chest: Effort normal and breath sounds normal. No respiratory distress. She has no wheezes. She has no rales.  Abdominal: Soft. Bowel sounds are normal. She exhibits no distension. There is no tenderness. There is no rebound.  Musculoskeletal: She exhibits no edema.  Neurological: She is alert and oriented to person, place, and time.  Skin: Skin is warm and dry.  Psychiatric: She has a normal mood and affect. Her behavior is normal.  Nursing note and vitals reviewed.    ED Treatments / Results  Labs (all labs ordered are listed, but only abnormal results are displayed) Labs Reviewed  BASIC METABOLIC PANEL - Abnormal; Notable for the following:       Result Value   Sodium 133 (*)    Chloride 99 (*)    Glucose, Bld 115 (*)    All other components  within normal limits  CBC WITH DIFFERENTIAL/PLATELET  I-STAT TROPOININ, ED  I-STAT CG4 LACTIC ACID, ED  I-STAT CG4 LACTIC ACID, ED    EKG  EKG Interpretation  Date/Time:  Sunday January 29 2016 08:26:23 EST Ventricular Rate:  105 PR Interval:    QRS Duration: 137 QT Interval:  449 QTC Calculation: 594 R Axis:   100 Text Interpretation:  Atrial fibrillation Right bundle branch block Nonspecific T abnormalities, lateral leads No significant change was found Confirmed by CAMPOS  MD, Lennette Bihari ([id number]) on 01/29/2016 8:52:23 AM       Radiology Dg Chest 2 View  Result Date: 01/29/2016 CLINICAL DATA:  Chest pain and cough for several days. EXAM: CHEST  2 VIEW COMPARISON:  04/19/2014 FINDINGS: There is moderate to marked cardiac enlargement. Aortic atherosclerosis noted. No  pleural effusion or edema. No airspace opacities. The bones are osteopenic. There is a scoliosis deformity involving the thoracic spine. IMPRESSION: No active cardiopulmonary disease. Electronically Signed   By: Kerby Moors M.D.   On: 01/29/2016 09:41    Procedures Procedures (including critical care time)  Medications Ordered in ED Medications  0.9 %  sodium chloride infusion (not administered)  sodium chloride 0.9 % bolus 500 mL (not administered)     Initial Impression / Assessment and Plan / ED Course  I have reviewed the triage vital signs and the nursing notes.  Pertinent labs & imaging results that were available during my care of the patient were reviewed by me and considered in my medical decision making (see chart for details).    8:44 AM Patient seen and examined, patient is here with cough, generalized body aches and malaise. Reports history of pneumonia. Will get chest x-ray, lab work, IV fluids given for dehydration. Lungs are clear on exam. ECG shows afib- chronic. Hypertensive, did not take her BP meds today. No hypoxia.   11:17 AM Patient's vital signs improved with fluids. Her lab work is  unremarkable. Chest x-ray negative for pneumonia. EKG and troponin unremarkable, A. fib, chronic. Question possible influenza, will start on Tamiflu given her age and symptoms. First dose given in emergency department. I discussed patient with Dr. Venora Maples. At this time, patient is stable for discharge home with normal vital signs, normal lab work, normal chest x-ray. She was tolerating fluids in emergency department without difficulty. She walked in the hallway with oxygen saturations staying above 95% and no difficulty ambulating. I discussed at length signs and symptoms that should prompt her back into emergency department in both her and her daughter voiced understanding. They're comfortable going home with strict return precautions.  Vitals:   01/29/16 0824 01/29/16 0900 01/29/16 1000 01/29/16 1030  BP: (!) 180/102 157/85 144/71 138/79  Pulse: 100 76 104 80  Resp: 24 15 22 14   Temp: 98.2 F (36.8 C)     TempSrc: Oral     SpO2: 97% 95% 97% 96%  Weight: 56.7 kg     Height: 5\' 5"  (1.651 m)        Final Clinical Impressions(s) / ED Diagnoses   Final diagnoses:  Viral URI with cough    New Prescriptions New Prescriptions   OSELTAMIVIR (TAMIFLU) 75 MG CAPSULE    Take 1 capsule (75 mg total) by mouth every 12 (twelve) hours.     Jeannett Senior, PA-C 01/29/16 [place], MD 01/29/16 1234

## 2016-01-29 NOTE — ED Triage Notes (Signed)
Coughing ,  Nausea and diarrhea times several days.

## 2016-01-29 NOTE — ED Notes (Addendum)
Pt ambulated around nurses O2 stayed  between 95-97

## 2016-02-01 DIAGNOSIS — R05 Cough: Secondary | ICD-10-CM | POA: Diagnosis not present

## 2016-02-01 DIAGNOSIS — J060 Acute laryngopharyngitis: Secondary | ICD-10-CM | POA: Diagnosis not present

## 2016-02-20 DIAGNOSIS — R7301 Impaired fasting glucose: Secondary | ICD-10-CM | POA: Diagnosis not present

## 2016-02-20 DIAGNOSIS — E782 Mixed hyperlipidemia: Secondary | ICD-10-CM | POA: Diagnosis not present

## 2016-02-20 DIAGNOSIS — D518 Other vitamin B12 deficiency anemias: Secondary | ICD-10-CM | POA: Diagnosis not present

## 2016-02-20 DIAGNOSIS — I482 Chronic atrial fibrillation: Secondary | ICD-10-CM | POA: Diagnosis not present

## 2016-02-20 DIAGNOSIS — E119 Type 2 diabetes mellitus without complications: Secondary | ICD-10-CM | POA: Diagnosis not present

## 2016-02-20 DIAGNOSIS — I10 Essential (primary) hypertension: Secondary | ICD-10-CM | POA: Diagnosis not present

## 2016-02-20 DIAGNOSIS — D509 Iron deficiency anemia, unspecified: Secondary | ICD-10-CM | POA: Diagnosis not present

## 2016-02-20 DIAGNOSIS — K700 Alcoholic fatty liver: Secondary | ICD-10-CM | POA: Diagnosis not present

## 2016-02-22 DIAGNOSIS — I10 Essential (primary) hypertension: Secondary | ICD-10-CM | POA: Diagnosis not present

## 2016-02-22 DIAGNOSIS — I482 Chronic atrial fibrillation: Secondary | ICD-10-CM | POA: Diagnosis not present

## 2016-02-22 DIAGNOSIS — E782 Mixed hyperlipidemia: Secondary | ICD-10-CM | POA: Diagnosis not present

## 2016-02-22 DIAGNOSIS — R7301 Impaired fasting glucose: Secondary | ICD-10-CM | POA: Diagnosis not present

## 2016-03-27 ENCOUNTER — Encounter: Payer: Self-pay | Admitting: Cardiovascular Disease

## 2016-03-27 ENCOUNTER — Ambulatory Visit (INDEPENDENT_AMBULATORY_CARE_PROVIDER_SITE_OTHER): Payer: Medicare Other | Admitting: Cardiovascular Disease

## 2016-03-27 VITALS — BP 144/76 | HR 93 | Ht 63.0 in | Wt 122.0 lb

## 2016-03-27 DIAGNOSIS — I10 Essential (primary) hypertension: Secondary | ICD-10-CM | POA: Diagnosis not present

## 2016-03-27 DIAGNOSIS — E7800 Pure hypercholesterolemia, unspecified: Secondary | ICD-10-CM

## 2016-03-27 DIAGNOSIS — I482 Chronic atrial fibrillation, unspecified: Secondary | ICD-10-CM

## 2016-03-27 NOTE — Progress Notes (Signed)
SUBJECTIVE: Renee Cameron presents for annual follow-up. She has a history of chronic atrial fibrillation and essential hypertension. She has a history of bilateral rectus sheath hematomas while on warfarin with a markedly supratherapeutic INR, and it was subsequently discontinued.  She also has a history of occlusive superficial thrombophlebitis involving the antecubital aspect of the cephalic vein.  She denies any symptoms of chest pain, palpitations, shortness of breath, lightheadedness, dizziness, leg swelling, abdominal pain, bleeding problems, orthopnea, PND, and syncope.  She continues to do her own housework and cooks for herself. She said her daughter does not allow her to do her own yard work.  She celebrated her 83th birthday last month with a boyfriend who is 93 years old.   Review of Systems: As per "subjective", otherwise negative.  No Known Allergies  Current Outpatient Prescriptions  Medication Sig Dispense Refill  . ALPRAZolam (XANAX) 0.5 MG tablet Take 0.5 mg by mouth at bedtime as needed for anxiety or sleep.     Marland Kitchen lisinopril (PRINIVIL,ZESTRIL) 20 MG tablet Take 1 tablet (20 mg total) by mouth daily. 90 tablet 3  . metoprolol succinate (TOPROL-XL) 25 MG 24 hr tablet TAKE 1 TABLET BY MOUTH EVERY DAY 90 tablet 3  . Multiple Vitamins-Minerals (CENTRUM SILVER PO) Take 1 tablet by mouth daily.      . Multiple Vitamins-Minerals (EYE VITAMINS PO) Take 2 capsules by mouth daily.    . Omega-3 Fatty Acids (FISH OIL) 1000 MG CAPS Take 1 capsule by mouth daily.      Alveda Reasons 15 MG TABS tablet TAKE 1 TABLET(15 MG) BY MOUTH DAILY WITH DINNER 30 tablet 6   No current facility-administered medications for this visit.     Past Medical History:  Diagnosis Date  . Atrial fibrillation ([place]) 1993   permanent; onset in 1993  . Chronic anticoagulation   . Hypertension    unspecified  . Hyperthyroidism 2000   2000  . Osteoporosis     Past Surgical History:  Procedure  Laterality Date  . APPENDECTOMY    . COLONOSCOPY  2005   Dr. Laural Golden: few small diverticula. external hemorrhoids  . TOTAL ABDOMINAL HYSTERECTOMY W/ BILATERAL SALPINGOOPHORECTOMY      Social History   Social History  . Marital status: Widowed    Spouse name: N/A  . Number of children: N/A  . Years of education: N/A   Occupational History  . Retired     [place] Topics  . Smoking status: Never Smoker  . Smokeless tobacco: Never Used  . Alcohol use No  . Drug use: No  . Sexual activity: Not on file   Other Topics Concern  . Not on file   Social History Narrative   Volunteers 2 days weekly     Vitals:   03/27/16 0940  BP: (!) 144/76  Pulse: 93  SpO2: 95%  Weight: 122 lb (55.3 kg)  Height: 5\' 3"  (1.6 m)    PHYSICAL EXAM General: NAD HEENT: Normal. Neck: No JVD, no thyromegaly. Lungs: Clear to auscultation bilaterally with normal respiratory effort. CV: Nondisplaced PMI.  Regular rate and irregular rhythm, normal S1/S2, no S3, no murmur. No pretibial or periankle edema.  No carotid bruit.   Abdomen: Soft, nontender, no distention.  Neurologic: Alert and oriented.  Psych: Normal affect. Skin: Normal. Musculoskeletal: No gross deformities.    ECG: Most recent ECG reviewed.      ASSESSMENT AND PLAN: 1. Chronic atrial fibrillation:  Symptomatically stable. Heart rate is well controlled on current dose of metoprolol. She has had no recurrence of bleeding problems. CHADSVASC score is 4, thus putting her at a high thromboembolic risk.  I will continue low-dose Xarelto 15 mg daily. I have instructed her to stop it immediately if she were to develop bleeding problems.  2. Essential hypertension: Reasonably controlled for age on lisinopril. No changes.  3. Hyperlipidemia: Maintained on Lipitor 40 mg.  Dispo: f/u 1 year.   Kate Sable, M.D., F.A.C.C.

## 2016-03-27 NOTE — Patient Instructions (Signed)
Your physician wants you to follow-up in: 1 Year with Dr. Dwana Curd. You will receive a reminder letter in the mail two months in advance. If you don't receive a letter, please call our office to schedule the follow-up appointment.  Your physician recommends that you continue on your current medications as directed. Please refer to the Current Medication list given to you today.  If you need a refill on your cardiac medications before your next appointment, please call your pharmacy.  Thank you for choosing [facility]!

## 2016-03-29 ENCOUNTER — Other Ambulatory Visit: Payer: Self-pay | Admitting: Cardiovascular Disease

## 2016-04-24 DIAGNOSIS — G3184 Mild cognitive impairment, so stated: Secondary | ICD-10-CM | POA: Diagnosis not present

## 2016-04-24 DIAGNOSIS — Z6820 Body mass index (BMI) 20.0-20.9, adult: Secondary | ICD-10-CM | POA: Diagnosis not present

## 2016-04-26 ENCOUNTER — Encounter (HOSPITAL_COMMUNITY): Payer: Self-pay | Admitting: Emergency Medicine

## 2016-04-26 ENCOUNTER — Emergency Department (HOSPITAL_COMMUNITY): Payer: Medicare Other

## 2016-04-26 ENCOUNTER — Inpatient Hospital Stay (HOSPITAL_COMMUNITY)
Admission: EM | Admit: 2016-04-26 | Discharge: 2016-04-27 | DRG: 392 | Disposition: A | Discharge status: Home / Self Care | Payer: Medicare Other | Attending: Internal Medicine | Admitting: Internal Medicine

## 2016-04-26 DIAGNOSIS — R110 Nausea: Principal | ICD-10-CM | POA: Diagnosis present

## 2016-04-26 DIAGNOSIS — F0390 Unspecified dementia without behavioral disturbance: Secondary | ICD-10-CM | POA: Diagnosis present

## 2016-04-26 DIAGNOSIS — I482 Chronic atrial fibrillation: Secondary | ICD-10-CM | POA: Diagnosis not present

## 2016-04-26 DIAGNOSIS — R748 Abnormal levels of other serum enzymes: Secondary | ICD-10-CM | POA: Diagnosis present

## 2016-04-26 DIAGNOSIS — M810 Age-related osteoporosis without current pathological fracture: Secondary | ICD-10-CM | POA: Diagnosis present

## 2016-04-26 DIAGNOSIS — R778 Other specified abnormalities of plasma proteins: Secondary | ICD-10-CM | POA: Diagnosis present

## 2016-04-26 DIAGNOSIS — R7989 Other specified abnormal findings of blood chemistry: Secondary | ICD-10-CM | POA: Diagnosis not present

## 2016-04-26 DIAGNOSIS — Z8249 Family history of ischemic heart disease and other diseases of the circulatory system: Secondary | ICD-10-CM

## 2016-04-26 DIAGNOSIS — Z823 Family history of stroke: Secondary | ICD-10-CM | POA: Diagnosis not present

## 2016-04-26 DIAGNOSIS — F419 Anxiety disorder, unspecified: Secondary | ICD-10-CM | POA: Diagnosis present

## 2016-04-26 DIAGNOSIS — Z90710 Acquired absence of both cervix and uterus: Secondary | ICD-10-CM | POA: Diagnosis not present

## 2016-04-26 DIAGNOSIS — I10 Essential (primary) hypertension: Secondary | ICD-10-CM | POA: Diagnosis not present

## 2016-04-26 DIAGNOSIS — Z8042 Family history of malignant neoplasm of prostate: Secondary | ICD-10-CM | POA: Diagnosis not present

## 2016-04-26 DIAGNOSIS — Z7901 Long term (current) use of anticoagulants: Secondary | ICD-10-CM

## 2016-04-26 DIAGNOSIS — I4821 Permanent atrial fibrillation: Secondary | ICD-10-CM | POA: Diagnosis present

## 2016-04-26 DIAGNOSIS — I4891 Unspecified atrial fibrillation: Secondary | ICD-10-CM | POA: Diagnosis not present

## 2016-04-26 HISTORY — DX: Unspecified dementia, unspecified severity, without behavioral disturbance, psychotic disturbance, mood disturbance, and anxiety: F03.90

## 2016-04-26 LAB — CBC
HEMATOCRIT: 43 % (ref 36.0–46.0)
HEMOGLOBIN: 14.2 g/dL (ref 12.0–15.0)
MCH: 30 pg (ref 26.0–34.0)
MCHC: 33 g/dL (ref 30.0–36.0)
MCV: 90.9 fL (ref 78.0–100.0)
Platelets: 201 10*3/uL (ref 150–400)
RBC: 4.73 MIL/uL (ref 3.87–5.11)
RDW: 13.8 % (ref 11.5–15.5)
WBC: 7.7 10*3/uL (ref 4.0–10.5)

## 2016-04-26 LAB — COMPREHENSIVE METABOLIC PANEL
ALBUMIN: 4.2 g/dL (ref 3.5–5.0)
ALT: 16 U/L (ref 14–54)
AST: 19 U/L (ref 15–41)
Alkaline Phosphatase: 73 U/L (ref 38–126)
Anion gap: 8 (ref 5–15)
BUN: 12 mg/dL (ref 6–20)
CHLORIDE: 102 mmol/L (ref 101–111)
CO2: 27 mmol/L (ref 22–32)
Calcium: 9.7 mg/dL (ref 8.9–10.3)
Creatinine, Ser: 0.64 mg/dL (ref 0.44–1.00)
GFR calc Af Amer: >60 mL/min (ref >60)
GFR calc non Af Amer: >60 mL/min (ref >60)
Glucose, Bld: 104 mg/dL — ABNORMAL HIGH (ref 65–99)
POTASSIUM: 4.3 mmol/L (ref 3.5–5.1)
SODIUM: 137 mmol/L (ref 135–145)
Total Bilirubin: 0.9 mg/dL (ref 0.3–1.2)
Total Protein: 7.2 g/dL (ref 6.5–8.1)

## 2016-04-26 LAB — TROPONIN I
Troponin I: 0.05 ng/mL (ref <0.03)
Troponin I: 0.05 ng/mL (ref <0.03)

## 2016-04-26 LAB — LIPASE, BLOOD: Lipase: 54 U/L — ABNORMAL HIGH (ref 11–51)

## 2016-04-26 MED ORDER — ONDANSETRON 4 MG PO TBDP
4.0000 mg | ORAL_TABLET | Freq: Once | ORAL | Status: AC
  Administered 2016-04-26: 4 mg via ORAL
  Filled 2016-04-26: qty 1

## 2016-04-26 MED ORDER — RIVAROXABAN 15 MG PO TABS
15.0000 mg | ORAL_TABLET | Freq: Every day | ORAL | Status: DC
  Administered 2016-04-26: 15 mg via ORAL
  Filled 2016-04-26 (×2): qty 1

## 2016-04-26 MED ORDER — SODIUM CHLORIDE 0.9 % IV SOLN
INTRAVENOUS | Status: DC
  Administered 2016-04-26: 23:00:00 via INTRAVENOUS

## 2016-04-26 MED ORDER — METOPROLOL SUCCINATE ER 25 MG PO TB24
25.0000 mg | ORAL_TABLET | Freq: Every day | ORAL | Status: DC
  Administered 2016-04-27: 25 mg via ORAL
  Filled 2016-04-26: qty 1

## 2016-04-26 MED ORDER — ACETAMINOPHEN 650 MG RE SUPP: 650.0000 mg | Freq: Four times a day (QID) | RECTAL | Status: DC | PRN

## 2016-04-26 MED ORDER — ALPRAZOLAM 0.5 MG PO TABS
0.2500 mg | ORAL_TABLET | Freq: Once | ORAL | Status: DC
  Filled 2016-04-26: qty 1

## 2016-04-26 MED ORDER — ALPRAZOLAM 0.5 MG PO TABS
0.5000 mg | ORAL_TABLET | ORAL | Status: AC
  Administered 2016-04-26: 0.5 mg via ORAL

## 2016-04-26 MED ORDER — ONDANSETRON HCL 4 MG PO TABS: 4.0000 mg | ORAL_TABLET | Freq: Four times a day (QID) | ORAL | Status: DC | PRN

## 2016-04-26 MED ORDER — ACETAMINOPHEN 325 MG PO TABS: 650.0000 mg | ORAL_TABLET | Freq: Four times a day (QID) | ORAL | Status: DC | PRN

## 2016-04-26 MED ORDER — LISINOPRIL 10 MG PO TABS
20.0000 mg | ORAL_TABLET | Freq: Every day | ORAL | Status: DC
Start: 2016-04-27 — End: 2016-04-27
  Administered 2016-04-27: 20 mg via ORAL
  Filled 2016-04-26: qty 2

## 2016-04-26 MED ORDER — ONDANSETRON HCL 4 MG/2ML IJ SOLN
4.0000 mg | Freq: Four times a day (QID) | INTRAMUSCULAR | Status: DC | PRN
Start: 2016-04-26 — End: 2016-04-27

## 2016-04-26 MED ORDER — ALPRAZOLAM 0.5 MG PO TABS: 0.5000 mg | ORAL_TABLET | Freq: Two times a day (BID) | ORAL | Status: DC | PRN

## 2016-04-26 MED ORDER — ENOXAPARIN SODIUM 40 MG/0.4ML ~~LOC~~ SOLN: 40.0000 mg | SUBCUTANEOUS | Status: DC

## 2016-04-26 NOTE — ED Notes (Signed)
Dr Alvino Chapel in to evaluate patient

## 2016-04-26 NOTE — ED Notes (Signed)
CRITICAL VALUE ALERT  Critical value received:  Troponin 0.05  Date of notification:  04/26/16  Time of notification:  1925  Critical value read back:Yes.    Nurse who received alert:  Joellyn Rued, RN  MD notified (1st page):  Dr Alvino Chapel  Time of first page:  [birth year]25  MD notified (2nd page):  Time of second page:  Responding MD:  Dr Alvino Chapel  Time MD responded:  [phone]

## 2016-04-26 NOTE — H&P (Addendum)
History and Physical    Renee Cameron:[id number] DOB: 1924-09-13 DOA: 04/26/2016  Referring MD/NP/PA: Dr. Alvino Chapel PCP: Renee Neighbors, MD  Patient coming from: home  Chief Complaint: Nausea  HPI: Renee Cameron is a 83 y.o. female with medical history significant of afib on Xarelto, anxiety, hypertension, and dementia; who presents with complaints of nausea for at least 2 days. Patient was recently started on Aricept for dementia and has taken it nightly for 2 days. She reports feelings of nausea and intermittent jerks that are new. Denies any vomiting, dysuria, urinary frequency, constipation, diarrhea, chest pain, shortness of breath, falls, or significant leg swelling. Patient reports normally being healthy without significant issues. It is unclear if she took all of her medications this morning. Patient still reports living at home and is able to complete all of her ADLs without need of assistance.  ED Course: Upon admission into the emergency department patient was seen to be afebrile, pulse 78 pill 122, respirations 18, blood pressure 170/116, O2 saturations maintained. Labs revealed lipase 54, troponin 0.05, and all other labs are relatively within normal limits.  Review of Systems: As per HPI otherwise 10 point review of systems negative.   Past Medical History:  Diagnosis Date  . Atrial fibrillation ([place]) 1993   permanent; onset in 1993  . Chronic anticoagulation   . Dementia   . Hypertension    unspecified  . Hyperthyroidism 2000   2000  . Osteoporosis     Past Surgical History:  Procedure Laterality Date  . APPENDECTOMY    . COLONOSCOPY  2005   Dr. Laural Golden: few small diverticula. external hemorrhoids  . TOTAL ABDOMINAL HYSTERECTOMY W/ BILATERAL SALPINGOOPHORECTOMY       reports that she has never smoked. She has never used smokeless tobacco. She reports that she does not drink alcohol or use drugs.  No Known Allergies  Family History  Problem Relation Age of  Onset  . Prostate cancer Father   . Stroke Father   . Heart disease Mother   . Coronary artery disease Sister     with prior CABG surgery    Prior to Admission medications   Medication Sig Start Date End Date Taking? Authorizing Provider  ALPRAZolam Duanne Moron) 0.5 MG tablet Take 0.5 mg by mouth at bedtime as needed for anxiety or sleep.     Historical Provider, MD  lisinopril (PRINIVIL,ZESTRIL) 20 MG tablet Take 1 tablet (20 mg total) by mouth daily. 08/30/15   Herminio Commons, MD  metoprolol succinate (TOPROL-XL) 25 MG 24 hr tablet TAKE 1 TABLET BY MOUTH EVERY DAY 05/13/15   Herminio Commons, MD  Multiple Vitamins-Minerals (CENTRUM SILVER PO) Take 1 tablet by mouth daily.      Historical Provider, MD  Multiple Vitamins-Minerals (EYE VITAMINS PO) Take 2 capsules by mouth daily.    Historical Provider, MD  Omega-3 Fatty Acids (FISH OIL) 1000 MG CAPS Take 1 capsule by mouth daily.      Historical Provider, MD  XARELTO 15 MG TABS tablet TAKE 1 TABLET(15 MG) BY MOUTH DAILY WITH DINNER 03/29/16   Herminio Commons, MD    Physical Exam:  Constitutional: Elderly female who appears younger than stated age 53:   04/26/16 1600 04/26/16 1812 04/26/16 1830 04/26/16 1900  BP: (!) 170/116 (!) 158/118 (!) 157/96 127/88  Pulse:  78 (!) 122 (!) 109  Resp:  18    Temp:      TempSrc:      SpO2:  97%  96% 96%  Weight: 56.7 kg (125 lb)     Height: 5\' 4"  (1.626 m)      Eyes: PERRL, lids and conjunctivae normal ENMT: Mucous membranes are moist. Posterior pharynx clear of any exudate or lesions.Normal dentition.  Neck: normal, supple, no masses, no thyromegaly Respiratory: clear to auscultation bilaterally, no wheezing, no crackles. Normal respiratory effort. No accessory muscle use.  Cardiovascular: Irregular irregular, no murmurs / rubs / gallops. No extremity edema. 2+ pedal pulses. No carotid bruits.  Abdomen: no tenderness, no masses palpated. No hepatosplenomegaly. Bowel sounds positive.    Musculoskeletal: no clubbing / cyanosis. No joint deformity upper and lower extremities. Good ROM, no contractures. Normal muscle tone.  Skin: no rashes, lesions, ulcers. No induration Neurologic: CN 2-12 grossly intact. Sensation intact, DTR normal. Strength 5/5 in all 4.  Psychiatric: Normal judgment and insight. Alert and oriented x 3. Anxious mood.  Abnormal short-term memory.    Labs on Admission: I have personally reviewed following labs and imaging studies  CBC:  Recent Labs Lab 04/26/16 1716  WBC 7.7  HGB 14.2  HCT 43.0  MCV 90.9  PLT 130   Basic Metabolic Panel:  Recent Labs Lab 04/26/16 1716  NA 137  K 4.3  CL 102  CO2 27  GLUCOSE 104*  BUN 12  CREATININE 0.64  CALCIUM 9.7   GFR: Estimated Creatinine Clearance: 40.4 mL/min (by C-G formula based on SCr of 0.64 mg/dL). Liver Function Tests:  Recent Labs Lab 04/26/16 1716  AST 19  ALT 16  ALKPHOS 73  BILITOT 0.9  PROT 7.2  ALBUMIN 4.2    Recent Labs Lab 04/26/16 1716  LIPASE 54*   No results for input(s): AMMONIA in the last 168 hours. Coagulation Profile: No results for input(s): INR, PROTIME in the last 168 hours. Cardiac Enzymes:  Recent Labs Lab 04/26/16 1818  TROPONINI 0.05*   BNP (last 3 results) No results for input(s): PROBNP in the last 8760 hours. HbA1C: No results for input(s): HGBA1C in the last 72 hours. CBG: No results for input(s): GLUCAP in the last 168 hours. Lipid Profile: No results for input(s): CHOL, HDL, LDLCALC, TRIG, CHOLHDL, LDLDIRECT in the last 72 hours. Thyroid Function Tests: No results for input(s): TSH, T4TOTAL, FREET4, T3FREE, THYROIDAB in the last 72 hours. Anemia Panel: No results for input(s): VITAMINB12, FOLATE, FERRITIN, TIBC, IRON, RETICCTPCT in the last 72 hours. Urine analysis: No results found for: COLORURINE, APPEARANCEUR, LABSPEC, PHURINE, GLUCOSEU, HGBUR, BILIRUBINUR, KETONESUR, PROTEINUR, UROBILINOGEN, NITRITE, LEUKOCYTESUR Sepsis  Labs: No results found for this or any previous visit (from the past 240 hour(s)).   Radiological Exams on Admission: No results found.  EKG: Independently reviewed. Atrial fibrillation  Assessment/Plan Nausea: Acute. Patient presents with a 2 day history of nausea symptoms without vomiting reported. Lipase is mildly elevated, but no acute abdominal pain symptoms. - Admit to a telemetry bed - Hold Aricept as possible cause of symptoms - Check urinalysis and TSH - IV fluids NS at 75 ml/hr - Antiemetics as needed - Continue heart healthy diet as tolerated  Atrial fibrillation on anticoagulation therapy:Chronic. CHADSVASC score is 4. Currently on Xarelto. - continue Xarelto and metoprolol  Elevated Troponin: Initial troponin elevated 0.05. On admission. She denies any significant chest pain symptoms. - trend troponins  Elevated lipase: Mild elevation of lipase of 54 on admission. - Consider need of further imaging studies if symptoms persist or worsen  Essential hypertension:  - Continue metoprolol and lisinopril  Dementia - Hold Aricept  Anxiety - Continue Xanax scheduled  DVT prophylaxis: Xarelto Code Status: Full Family Communication: Discussed plan of care with the patient in daughter who is present at bedside. Disposition Plan: Likely discharge home once medically stable.   Consults called: None  Admission status: Observation  Norval Morton MD [facility] Pager [phone]  If 7PM-7AM, please contact night-coverage [email or web address] Password [facility]  04/26/2016, 7:49 PM

## 2016-04-26 NOTE — ED Triage Notes (Signed)
Patient has been feeling nauseated and whole body jerks occasionally,  These s/s started x 2 days ago after starting donepezil

## 2016-04-26 NOTE — ED Provider Notes (Signed)
[facility] DEPT Provider Note   CSN: [id number] Arrival date & time: 04/26/16  1551     History   Chief Complaint Chief Complaint  Patient presents with  . Nausea    HPI Renee Cameron is a 83 y.o. female.  HPI Patient was sent with nausea. Began 2 days ago after starting Aricept. No vomiting. No diarrhea. No abdominal pain. No fevers. No headache. No confusion. States also had some jerking. States both her for hands just jerked. Not currently having the jerking. No headache. No chest pain. No dysuria.   Past Medical History:  Diagnosis Date  . Atrial fibrillation ([place]) 1993   permanent; onset in 1993  . Chronic anticoagulation   . Dementia   . Hypertension    unspecified  . Hyperthyroidism 2000   2000  . Osteoporosis     Patient Active Problem List   Diagnosis Date Noted  . Abdominal pain in female   . Diarrhea   . Rectus sheath hematoma   . Acute blood loss anemia 03/14/2014  . Bruise, trunk 03/14/2014  . Vomiting 03/11/2014  . CAP (community acquired pneumonia) 03/11/2014  . Hyponatremia 03/11/2014  . Nausea   . Encounter for therapeutic drug monitoring 02/05/2013  . Atrial fibrillation ([place])   . Hyperthyroidism   . Hypertension   . Chronic anticoagulation     Past Surgical History:  Procedure Laterality Date  . APPENDECTOMY    . COLONOSCOPY  2005   Dr. Laural Golden: few small diverticula. external hemorrhoids  . TOTAL ABDOMINAL HYSTERECTOMY W/ BILATERAL SALPINGOOPHORECTOMY      OB History    No data available       Home Medications    Prior to Admission medications   Medication Sig Start Date End Date Taking? Authorizing Provider  ALPRAZolam Duanne Moron) 0.5 MG tablet Take 0.5 mg by mouth at bedtime as needed for anxiety or sleep.     Historical Provider, MD  lisinopril (PRINIVIL,ZESTRIL) 20 MG tablet Take 1 tablet (20 mg total) by mouth daily. 08/30/15   Herminio Commons, MD  metoprolol succinate (TOPROL-XL) 25 MG 24 hr tablet TAKE 1 TABLET  BY MOUTH EVERY DAY 05/13/15   Herminio Commons, MD  Multiple Vitamins-Minerals (CENTRUM SILVER PO) Take 1 tablet by mouth daily.      Historical Provider, MD  Multiple Vitamins-Minerals (EYE VITAMINS PO) Take 2 capsules by mouth daily.    Historical Provider, MD  Omega-3 Fatty Acids (FISH OIL) 1000 MG CAPS Take 1 capsule by mouth daily.      Historical Provider, MD  XARELTO 15 MG TABS tablet TAKE 1 TABLET(15 MG) BY MOUTH DAILY WITH DINNER 03/29/16   Herminio Commons, MD    Family History Family History  Problem Relation Age of Onset  . Prostate cancer Father   . Stroke Father   . Heart disease Mother   . Coronary artery disease Sister     with prior CABG surgery    Social History Social History  Substance Use Topics  . Smoking status: Never Smoker  . Smokeless tobacco: Never Used  . Alcohol use No     Allergies   Patient has no known allergies.   Review of Systems Review of Systems  Constitutional: Negative for appetite change.  HENT: Negative for congestion.   Respiratory: Negative for shortness of breath.   Cardiovascular: Negative for chest pain and leg swelling.  Gastrointestinal: Positive for nausea. Negative for abdominal pain and anal bleeding.  Endocrine: Negative for polyuria.  Genitourinary: Negative for flank pain.  Musculoskeletal: Negative for back pain.  Neurological: Positive for tremors. Negative for syncope, speech difficulty and weakness.  Hematological: Negative for adenopathy.  Psychiatric/Behavioral: Negative for behavioral problems.     Physical Exam Updated Vital Signs BP 127/88   Pulse (!) 109   Temp 98.7 F (37.1 C) (Oral)   Resp 18   Ht 5\' 4"  (1.626 m)   Wt 125 lb (56.7 kg)   SpO2 96%   BMI 21.46 kg/m   Physical Exam  Constitutional: She is oriented to person, place, and time. She appears well-developed.  HENT:  Head: Atraumatic.  Eyes: EOM are normal.  Neck: Neck supple.  Cardiovascular: Normal rate.   Pulmonary/Chest:  Effort normal.  Abdominal: Soft.  Musculoskeletal: She exhibits no edema.  Neurological: She is alert and oriented to person, place, and time.  Finger-nose test bilateral. Did have slight tremor that appear to extinguish.  Psychiatric: She has a normal mood and affect.     ED Treatments / Results  Labs (all labs ordered are listed, but only abnormal results are displayed) Labs Reviewed  LIPASE, BLOOD - Abnormal; Notable for the following:       Result Value   Lipase 54 (*)    All other components within normal limits  COMPREHENSIVE METABOLIC PANEL - Abnormal; Notable for the following:    Glucose, Bld 104 (*)    All other components within normal limits  TROPONIN I - Abnormal; Notable for the following:    Troponin I 0.05 (*)    All other components within normal limits  CBC  URINALYSIS, ROUTINE W REFLEX MICROSCOPIC    EKG  EKG Interpretation  Date/Time:  Thursday April 26 2016 19:11:12 EDT Ventricular Rate:  98 PR Interval:    QRS Duration: 143 QT Interval:  399 QTC Calculation: 428 R Axis:   101 Text Interpretation:  Atrial fibrillation Paired ventricular premature complexes Right bundle branch block Lateral infarct, old Confirmed by Alvino Chapel  MD, Alishba [place] [phone]) on 04/26/2016 7:29:01 PM       Radiology No results found.  Procedures Procedures (including critical care time)  Medications Ordered in ED Medications  ondansetron (ZOFRAN-ODT) disintegrating tablet 4 mg (4 mg Oral Given 04/26/16 1809)     Initial Impression / Assessment and Plan / ED Course  I have reviewed the triage vital signs and the nursing notes.  Pertinent labs & imaging results that were available during my care of the patient were reviewed by me and considered in my medical decision making (see chart for details).     Patient presents with nausea. Began after she started Aricept. No vomiting diarrhea. Has had some shaking which appears to be anxiety related with it too. No chest pain.  No trouble breathing. She was however rather hypertensive on arrival. EKG reassuring but initial troponin minimally elevated. With troponin elevation I could is reasonable for admission for monitoring make sure this is not either a non-STEMI or strain from hypertension. History of chronic A. fib.  Final Clinical Impressions(s) / ED Diagnoses   Final diagnoses:  Nausea  Hypertension, unspecified type  Elevated troponin    New Prescriptions New Prescriptions   No medications on file     Davonna Belling, MD 04/26/16 1939

## 2016-04-27 DIAGNOSIS — Z7901 Long term (current) use of anticoagulants: Secondary | ICD-10-CM | POA: Diagnosis not present

## 2016-04-27 DIAGNOSIS — R110 Nausea: Secondary | ICD-10-CM | POA: Diagnosis not present

## 2016-04-27 DIAGNOSIS — F0390 Unspecified dementia without behavioral disturbance: Secondary | ICD-10-CM | POA: Diagnosis not present

## 2016-04-27 DIAGNOSIS — M810 Age-related osteoporosis without current pathological fracture: Secondary | ICD-10-CM | POA: Diagnosis not present

## 2016-04-27 DIAGNOSIS — I4891 Unspecified atrial fibrillation: Secondary | ICD-10-CM | POA: Diagnosis not present

## 2016-04-27 DIAGNOSIS — I10 Essential (primary) hypertension: Secondary | ICD-10-CM | POA: Diagnosis not present

## 2016-04-27 LAB — BASIC METABOLIC PANEL
ANION GAP: 6 (ref 5–15)
BUN: 11 mg/dL (ref 6–20)
CHLORIDE: 102 mmol/L (ref 101–111)
CO2: 28 mmol/L (ref 22–32)
Calcium: 8.8 mg/dL — ABNORMAL LOW (ref 8.9–10.3)
Creatinine, Ser: 0.68 mg/dL (ref 0.44–1.00)
GFR calc Af Amer: >60 mL/min (ref >60)
GLUCOSE: 110 mg/dL — AB (ref 65–99)
POTASSIUM: 3.5 mmol/L (ref 3.5–5.1)
Sodium: 136 mmol/L (ref 135–145)

## 2016-04-27 LAB — CBC
HCT: 41.7 % (ref 36.0–46.0)
HEMOGLOBIN: 13.8 g/dL (ref 12.0–15.0)
MCH: 30.4 pg (ref 26.0–34.0)
MCHC: 33.1 g/dL (ref 30.0–36.0)
MCV: 91.9 fL (ref 78.0–100.0)
Platelets: 184 10*3/uL (ref 150–400)
RBC: 4.54 MIL/uL (ref 3.87–5.11)
RDW: 14 % (ref 11.5–15.5)
WBC: 6.5 10*3/uL (ref 4.0–10.5)

## 2016-04-27 LAB — TSH: TSH: 3.373 u[IU]/mL (ref 0.350–4.500)

## 2016-04-27 LAB — TROPONIN I: Troponin I: 0.05 ng/mL (ref <0.03)

## 2016-04-27 NOTE — Discharge Summary (Signed)
Physician Discharge Summary  Renee Cameron VZC:[id number] DOB: 05/22/1924 DOA: 04/26/2016  PCP: Wende Neighbors, MD  Admit date: 04/26/2016 Discharge date: 04/27/2016  Time spent: 45 minutes  Recommendations for Outpatient Follow-up:  -Will be discharged home today. -Advised to follow up with PCP in 2 weeks.   Discharge Diagnoses:  Principal Problem:   Nausea Active Problems:   Atrial fibrillation (H[facility])   Hypertension   Chronic anticoagulation   Elevated troponin   Elevated lipase   Dementia   Discharge Condition: Stable and improved  Filed Weights   04/26/16 1600  Weight: 56.7 kg (125 lb)    History of present illness:  As per Dr. Tamala Julian on 4/19:  Renee Cameron is a 83 y.o. female with medical history significant of afib on Xarelto, anxiety, hypertension, and dementia; who presents with complaints of nausea for at least 2 days. Patient was recently started on Aricept for dementia and has taken it nightly for 2 days. She reports feelings of nausea and intermittent jerks that are new. Denies any vomiting, dysuria, urinary frequency, constipation, diarrhea, chest pain, shortness of breath, falls, or significant leg swelling. Patient reports normally being healthy without significant issues. It is unclear if she took all of her medications this morning. Patient still reports living at home and is able to complete all of her ADLs without need of assistance.  ED Course: Upon admission into the emergency department patient was seen to be afebrile, pulse 78 pill 122, respirations 18, blood pressure 170/116, O2 saturations maintained. Labs revealed lipase 54, troponin 0.05, and all other labs are relatively within normal limits.  Hospital Course:    Nausea -Resolved; etiology unclear. -Could have been related to aricept that was started a few days prior to admission. -Has been taken off aricept for now. -Had a slightly elevated lipase of 54, but no abdominal pain to consider  possibility of acute pancreatitis. -In any case, she is back to baseline and would like to be discharged home today.  Rest of chronic medical conditions have been stable during this <24 hour hospitalization.  Procedures:  None   Consultations:  None  Discharge Instructions  Discharge Instructions    Diet - low sodium heart healthy    Complete by:  As directed    Increase activity slowly    Complete by:  As directed      Allergies as of 04/27/2016   No Known Allergies     Medication List    STOP taking these medications   donepezil 5 MG tablet Commonly known as:  ARICEPT   NON FORMULARY     TAKE these medications   ALPRAZolam 0.5 MG tablet Commonly known as:  XANAX Take 0.5 mg by mouth at bedtime as needed for anxiety or sleep.   lisinopril 20 MG tablet Commonly known as:  PRINIVIL,ZESTRIL Take 1 tablet (20 mg total) by mouth daily.   metoprolol succinate 25 MG 24 hr tablet Commonly known as:  TOPROL-XL TAKE 1 TABLET BY MOUTH EVERY DAY   PRESERVISION AREDS 2+MULTI VIT Caps Take 2 capsules by mouth daily.   XARELTO 15 MG Tabs tablet Generic drug:  Rivaroxaban TAKE 1 TABLET(15 MG) BY MOUTH DAILY WITH DINNER      No Known Allergies Follow-up Information    Wende Neighbors, MD. Schedule an appointment as soon as possible for a visit on 05/07/2016.   Specialty:  Internal Medicine Why:  10:40 Contact information: [place] [address] [phone]  The results of significant diagnostics from this hospitalization (including imaging, microbiology, ancillary and laboratory) are listed below for reference.    Significant Diagnostic Studies: Dg Chest 2 View  Result Date: 04/26/2016 CLINICAL DATA:  Nausea.  Jerking of the body. EXAM: CHEST  2 VIEW COMPARISON:  01/29/2016 FINDINGS: Chronic cardiomegaly. Aortic atherosclerosis. The right lung is clear. The left lung is clear except for mild chronic volume loss at the base secondary  to the cardiomegaly. No effusion. There is chronic spinal curvature convex to the right. No acute bone finding. IMPRESSION: Chronic cardiomegaly. Chronic mild left base volume loss. No active process otherwise. Electronically Signed   By: Nelson Chimes M.D.   On: 04/26/2016 20:12    Microbiology: No results found for this or any previous visit (from the past 240 hour(s)).   Labs: Basic Metabolic Panel:  Recent Labs Lab 04/26/16 1716 04/27/16 0413  NA 137 136  K 4.3 3.5  CL 102 102  CO2 27 28  GLUCOSE 104* 110*  BUN 12 11  CREATININE 0.64 0.68  CALCIUM 9.7 8.8*   Liver Function Tests:  Recent Labs Lab 04/26/16 1716  AST 19  ALT 16  ALKPHOS 73  BILITOT 0.9  PROT 7.2  ALBUMIN 4.2    Recent Labs Lab 04/26/16 1716  LIPASE 54*   No results for input(s): AMMONIA in the last 168 hours. CBC:  Recent Labs Lab 04/26/16 1716 04/27/16 0413  WBC 7.7 6.5  HGB 14.2 13.8  HCT 43.0 41.7  MCV 90.9 91.9  PLT 201 184   Cardiac Enzymes:  Recent Labs Lab 04/26/16 1818 04/26/16 2216 04/27/16 0413  TROPONINI 0.05* 0.05* 0.05*   BNP: BNP (last 3 results) No results for input(s): BNP in the last 8760 hours.  ProBNP (last 3 results) No results for input(s): PROBNP in the last 8760 hours.  CBG: No results for input(s): GLUCAP in the last 168 hours.     SignedLelon Frohlich  [facility] Pager: [phone] 04/27/2016, 1:27 PM

## 2016-04-27 NOTE — Care Management Obs Status (Signed)
[facility] NOTIFICATION   Patient Details  Name: Renee Cameron MRN: [id number] Date of Birth: December 31, 1924   Medicare Observation Status Notification Given:  No (discharged within 24 hours)    Kesi Perrow, Chauncey Reading, RN 04/27/2016, 12:25 PM

## 2016-05-05 ENCOUNTER — Other Ambulatory Visit: Payer: Self-pay | Admitting: Cardiovascular Disease

## 2016-05-24 DIAGNOSIS — G3184 Mild cognitive impairment, so stated: Secondary | ICD-10-CM | POA: Diagnosis not present

## 2016-05-24 DIAGNOSIS — Z09 Encounter for follow-up examination after completed treatment for conditions other than malignant neoplasm: Secondary | ICD-10-CM | POA: Diagnosis not present

## 2016-05-24 DIAGNOSIS — R110 Nausea: Secondary | ICD-10-CM | POA: Diagnosis not present

## 2016-06-19 DIAGNOSIS — D509 Iron deficiency anemia, unspecified: Secondary | ICD-10-CM | POA: Diagnosis not present

## 2016-06-19 DIAGNOSIS — R7301 Impaired fasting glucose: Secondary | ICD-10-CM | POA: Diagnosis not present

## 2016-06-19 DIAGNOSIS — I10 Essential (primary) hypertension: Secondary | ICD-10-CM | POA: Diagnosis not present

## 2016-06-21 DIAGNOSIS — E782 Mixed hyperlipidemia: Secondary | ICD-10-CM | POA: Diagnosis not present

## 2016-06-21 DIAGNOSIS — R7301 Impaired fasting glucose: Secondary | ICD-10-CM | POA: Diagnosis not present

## 2016-06-21 DIAGNOSIS — G3184 Mild cognitive impairment, so stated: Secondary | ICD-10-CM | POA: Diagnosis not present

## 2016-06-21 DIAGNOSIS — I482 Chronic atrial fibrillation: Secondary | ICD-10-CM | POA: Diagnosis not present

## 2016-06-21 DIAGNOSIS — E875 Hyperkalemia: Secondary | ICD-10-CM | POA: Diagnosis not present

## 2016-06-21 DIAGNOSIS — I10 Essential (primary) hypertension: Secondary | ICD-10-CM | POA: Diagnosis not present

## 2016-07-09 DIAGNOSIS — Z6820 Body mass index (BMI) 20.0-20.9, adult: Secondary | ICD-10-CM | POA: Diagnosis not present

## 2016-07-09 DIAGNOSIS — G3184 Mild cognitive impairment, so stated: Secondary | ICD-10-CM | POA: Diagnosis not present

## 2016-08-02 ENCOUNTER — Other Ambulatory Visit: Payer: Self-pay | Admitting: Cardiovascular Disease

## 2016-08-06 DIAGNOSIS — G3184 Mild cognitive impairment, so stated: Secondary | ICD-10-CM | POA: Diagnosis not present

## 2016-08-06 DIAGNOSIS — Z6821 Body mass index (BMI) 21.0-21.9, adult: Secondary | ICD-10-CM | POA: Diagnosis not present

## 2016-08-09 ENCOUNTER — Other Ambulatory Visit: Payer: Self-pay | Admitting: Cardiovascular Disease

## 2016-10-22 DIAGNOSIS — Z23 Encounter for immunization: Secondary | ICD-10-CM | POA: Diagnosis not present

## 2016-10-22 DIAGNOSIS — F329 Major depressive disorder, single episode, unspecified: Secondary | ICD-10-CM | POA: Diagnosis not present

## 2016-10-22 DIAGNOSIS — G3184 Mild cognitive impairment, so stated: Secondary | ICD-10-CM | POA: Diagnosis not present

## 2016-10-22 DIAGNOSIS — M25512 Pain in left shoulder: Secondary | ICD-10-CM | POA: Diagnosis not present

## 2016-10-22 DIAGNOSIS — F411 Generalized anxiety disorder: Secondary | ICD-10-CM | POA: Diagnosis not present

## 2016-10-22 DIAGNOSIS — Z6821 Body mass index (BMI) 21.0-21.9, adult: Secondary | ICD-10-CM | POA: Diagnosis not present

## 2016-10-22 DIAGNOSIS — H9192 Unspecified hearing loss, left ear: Secondary | ICD-10-CM | POA: Diagnosis not present

## 2016-10-30 ENCOUNTER — Other Ambulatory Visit: Payer: Self-pay | Admitting: Cardiovascular Disease

## 2016-11-06 ENCOUNTER — Other Ambulatory Visit: Payer: Self-pay | Admitting: Cardiovascular Disease

## 2016-11-14 DIAGNOSIS — G3184 Mild cognitive impairment, so stated: Secondary | ICD-10-CM | POA: Diagnosis not present

## 2016-11-14 DIAGNOSIS — S4992XA Unspecified injury of left shoulder and upper arm, initial encounter: Secondary | ICD-10-CM | POA: Diagnosis not present

## 2016-11-14 DIAGNOSIS — F418 Other specified anxiety disorders: Secondary | ICD-10-CM | POA: Diagnosis not present

## 2016-11-14 DIAGNOSIS — H6122 Impacted cerumen, left ear: Secondary | ICD-10-CM | POA: Diagnosis not present

## 2016-11-14 DIAGNOSIS — H9192 Unspecified hearing loss, left ear: Secondary | ICD-10-CM | POA: Diagnosis not present

## 2016-12-25 DIAGNOSIS — H9192 Unspecified hearing loss, left ear: Secondary | ICD-10-CM | POA: Diagnosis not present

## 2016-12-25 DIAGNOSIS — G3184 Mild cognitive impairment, so stated: Secondary | ICD-10-CM | POA: Diagnosis not present

## 2016-12-25 DIAGNOSIS — R7301 Impaired fasting glucose: Secondary | ICD-10-CM | POA: Diagnosis not present

## 2016-12-25 DIAGNOSIS — E782 Mixed hyperlipidemia: Secondary | ICD-10-CM | POA: Diagnosis not present

## 2016-12-25 DIAGNOSIS — S4992XA Unspecified injury of left shoulder and upper arm, initial encounter: Secondary | ICD-10-CM | POA: Diagnosis not present

## 2016-12-25 DIAGNOSIS — F418 Other specified anxiety disorders: Secondary | ICD-10-CM | POA: Diagnosis not present

## 2016-12-25 DIAGNOSIS — D509 Iron deficiency anemia, unspecified: Secondary | ICD-10-CM | POA: Diagnosis not present

## 2016-12-25 DIAGNOSIS — I10 Essential (primary) hypertension: Secondary | ICD-10-CM | POA: Diagnosis not present

## 2016-12-25 DIAGNOSIS — H6122 Impacted cerumen, left ear: Secondary | ICD-10-CM | POA: Diagnosis not present

## 2016-12-28 DIAGNOSIS — E782 Mixed hyperlipidemia: Secondary | ICD-10-CM | POA: Diagnosis not present

## 2016-12-28 DIAGNOSIS — R7301 Impaired fasting glucose: Secondary | ICD-10-CM | POA: Diagnosis not present

## 2016-12-28 DIAGNOSIS — I10 Essential (primary) hypertension: Secondary | ICD-10-CM | POA: Diagnosis not present

## 2016-12-28 DIAGNOSIS — E875 Hyperkalemia: Secondary | ICD-10-CM | POA: Diagnosis not present

## 2016-12-28 DIAGNOSIS — F339 Major depressive disorder, recurrent, unspecified: Secondary | ICD-10-CM | POA: Diagnosis not present

## 2016-12-28 DIAGNOSIS — Z6820 Body mass index (BMI) 20.0-20.9, adult: Secondary | ICD-10-CM | POA: Diagnosis not present

## 2016-12-28 DIAGNOSIS — G3184 Mild cognitive impairment, so stated: Secondary | ICD-10-CM | POA: Diagnosis not present

## 2016-12-28 DIAGNOSIS — I482 Chronic atrial fibrillation: Secondary | ICD-10-CM | POA: Diagnosis not present

## 2017-03-28 DIAGNOSIS — I10 Essential (primary) hypertension: Secondary | ICD-10-CM | POA: Diagnosis not present

## 2017-03-28 DIAGNOSIS — I482 Chronic atrial fibrillation: Secondary | ICD-10-CM | POA: Diagnosis not present

## 2017-03-28 DIAGNOSIS — G3184 Mild cognitive impairment, so stated: Secondary | ICD-10-CM | POA: Diagnosis not present

## 2017-03-28 DIAGNOSIS — Z6820 Body mass index (BMI) 20.0-20.9, adult: Secondary | ICD-10-CM | POA: Diagnosis not present

## 2017-03-28 DIAGNOSIS — F39 Unspecified mood [affective] disorder: Secondary | ICD-10-CM | POA: Diagnosis not present

## 2017-04-05 DIAGNOSIS — Z111 Encounter for screening for respiratory tuberculosis: Secondary | ICD-10-CM | POA: Diagnosis not present

## 2017-04-24 ENCOUNTER — Ambulatory Visit: Payer: Medicare Other | Admitting: Cardiovascular Disease

## 2017-05-08 DIAGNOSIS — I442 Atrioventricular block, complete: Secondary | ICD-10-CM

## 2017-05-08 HISTORY — DX: Atrioventricular block, complete: I44.2

## 2017-05-28 DIAGNOSIS — R7301 Impaired fasting glucose: Secondary | ICD-10-CM | POA: Diagnosis not present

## 2017-05-28 DIAGNOSIS — E782 Mixed hyperlipidemia: Secondary | ICD-10-CM | POA: Diagnosis not present

## 2017-05-29 ENCOUNTER — Other Ambulatory Visit: Payer: Self-pay

## 2017-05-29 ENCOUNTER — Emergency Department (HOSPITAL_COMMUNITY): Payer: Medicare Other

## 2017-05-29 ENCOUNTER — Inpatient Hospital Stay (HOSPITAL_COMMUNITY)
Admission: EM | Admit: 2017-05-29 | Discharge: 2017-06-01 | DRG: 244 | Disposition: A | Discharge status: Home / Self Care | Payer: Medicare Other | Source: Other Acute Inpatient Hospital | Attending: Cardiovascular Disease | Admitting: Cardiovascular Disease

## 2017-05-29 ENCOUNTER — Encounter (HOSPITAL_COMMUNITY): Payer: Self-pay | Admitting: Emergency Medicine

## 2017-05-29 DIAGNOSIS — I4821 Permanent atrial fibrillation: Secondary | ICD-10-CM

## 2017-05-29 DIAGNOSIS — Z823 Family history of stroke: Secondary | ICD-10-CM

## 2017-05-29 DIAGNOSIS — Z90722 Acquired absence of ovaries, bilateral: Secondary | ICD-10-CM

## 2017-05-29 DIAGNOSIS — E0590 Thyrotoxicosis, unspecified without thyrotoxic crisis or storm: Secondary | ICD-10-CM | POA: Diagnosis present

## 2017-05-29 DIAGNOSIS — Z8042 Family history of malignant neoplasm of prostate: Secondary | ICD-10-CM | POA: Diagnosis not present

## 2017-05-29 DIAGNOSIS — I10 Essential (primary) hypertension: Secondary | ICD-10-CM | POA: Diagnosis present

## 2017-05-29 DIAGNOSIS — I481 Persistent atrial fibrillation: Secondary | ICD-10-CM | POA: Diagnosis present

## 2017-05-29 DIAGNOSIS — Z79899 Other long term (current) drug therapy: Secondary | ICD-10-CM | POA: Diagnosis not present

## 2017-05-29 DIAGNOSIS — Z950 Presence of cardiac pacemaker: Secondary | ICD-10-CM

## 2017-05-29 DIAGNOSIS — Z9049 Acquired absence of other specified parts of digestive tract: Secondary | ICD-10-CM

## 2017-05-29 DIAGNOSIS — J90 Pleural effusion, not elsewhere classified: Secondary | ICD-10-CM | POA: Diagnosis not present

## 2017-05-29 DIAGNOSIS — M810 Age-related osteoporosis without current pathological fracture: Secondary | ICD-10-CM | POA: Diagnosis present

## 2017-05-29 DIAGNOSIS — I482 Chronic atrial fibrillation: Secondary | ICD-10-CM | POA: Diagnosis present

## 2017-05-29 DIAGNOSIS — J811 Chronic pulmonary edema: Secondary | ICD-10-CM | POA: Diagnosis not present

## 2017-05-29 DIAGNOSIS — Z8249 Family history of ischemic heart disease and other diseases of the circulatory system: Secondary | ICD-10-CM | POA: Diagnosis not present

## 2017-05-29 DIAGNOSIS — F0390 Unspecified dementia without behavioral disturbance: Secondary | ICD-10-CM | POA: Diagnosis present

## 2017-05-29 DIAGNOSIS — I361 Nonrheumatic tricuspid (valve) insufficiency: Secondary | ICD-10-CM | POA: Diagnosis not present

## 2017-05-29 DIAGNOSIS — Z7901 Long term (current) use of anticoagulants: Secondary | ICD-10-CM | POA: Diagnosis not present

## 2017-05-29 DIAGNOSIS — R946 Abnormal results of thyroid function studies: Secondary | ICD-10-CM

## 2017-05-29 DIAGNOSIS — I442 Atrioventricular block, complete: Principal | ICD-10-CM | POA: Diagnosis present

## 2017-05-29 DIAGNOSIS — Z90710 Acquired absence of both cervix and uterus: Secondary | ICD-10-CM | POA: Diagnosis not present

## 2017-05-29 HISTORY — DX: Permanent atrial fibrillation: I48.21

## 2017-05-29 HISTORY — DX: Atrioventricular block, complete: I44.2

## 2017-05-29 LAB — COMPREHENSIVE METABOLIC PANEL
ALK PHOS: 78 U/L (ref 38–126)
ALT: 33 U/L (ref 14–54)
ANION GAP: 9 (ref 5–15)
AST: 33 U/L (ref 15–41)
Albumin: 4.3 g/dL (ref 3.5–5.0)
BUN: 19 mg/dL (ref 6–20)
CALCIUM: 9.5 mg/dL (ref 8.9–10.3)
CO2: 27 mmol/L (ref 22–32)
Chloride: 97 mmol/L — ABNORMAL LOW (ref 101–111)
Creatinine, Ser: 0.95 mg/dL (ref 0.44–1.00)
GFR calc non Af Amer: 51 mL/min — ABNORMAL LOW (ref >60)
GFR, EST AFRICAN AMERICAN: 59 mL/min — AB (ref >60)
Glucose, Bld: 145 mg/dL — ABNORMAL HIGH (ref 65–99)
Potassium: 4.3 mmol/L (ref 3.5–5.1)
SODIUM: 133 mmol/L — AB (ref 135–145)
Total Bilirubin: 1.6 mg/dL — ABNORMAL HIGH (ref 0.3–1.2)
Total Protein: 7.2 g/dL (ref 6.5–8.1)

## 2017-05-29 LAB — PROTIME-INR
INR: 1.32
PROTHROMBIN TIME: 16.3 s — AB (ref 11.4–15.2)

## 2017-05-29 LAB — CBC WITH DIFFERENTIAL/PLATELET
BASOS ABS: 0 10*3/uL (ref 0.0–0.1)
BASOS PCT: 0 %
EOS ABS: 0 10*3/uL (ref 0.0–0.7)
Eosinophils Relative: 0 %
HCT: 41.1 % (ref 36.0–46.0)
HEMOGLOBIN: 13.4 g/dL (ref 12.0–15.0)
LYMPHS ABS: 1 10*3/uL (ref 0.7–4.0)
Lymphocytes Relative: 9 %
MCH: 30.5 pg (ref 26.0–34.0)
MCHC: 32.6 g/dL (ref 30.0–36.0)
MCV: 93.4 fL (ref 78.0–100.0)
Monocytes Absolute: 0.8 10*3/uL (ref 0.1–1.0)
Monocytes Relative: 7 %
NEUTROS PCT: 84 %
Neutro Abs: 9.3 10*3/uL — ABNORMAL HIGH (ref 1.7–7.7)
Platelets: 186 10*3/uL (ref 150–400)
RBC: 4.4 MIL/uL (ref 3.87–5.11)
RDW: 13.8 % (ref 11.5–15.5)
WBC: 11.2 10*3/uL — AB (ref 4.0–10.5)

## 2017-05-29 LAB — TROPONIN I: Troponin I: 0.1 ng/mL (ref <0.03)

## 2017-05-29 LAB — TSH: TSH: 8.847 u[IU]/mL — ABNORMAL HIGH (ref 0.350–4.500)

## 2017-05-29 LAB — MAGNESIUM: Magnesium: 1.8 mg/dL (ref 1.7–2.4)

## 2017-05-29 MED ORDER — ALPRAZOLAM 0.5 MG PO TABS
0.5000 mg | ORAL_TABLET | Freq: Every evening | ORAL | Status: DC | PRN
  Administered 2017-05-29 – 2017-05-31 (×2): 0.5 mg via ORAL
  Filled 2017-05-29 (×2): qty 1

## 2017-05-29 MED ORDER — DESVENLAFAXINE SUCCINATE ER 50 MG PO TB24
50.0000 mg | ORAL_TABLET | Freq: Every day | ORAL | Status: DC
  Administered 2017-05-29 – 2017-05-31 (×3): 50 mg via ORAL
  Filled 2017-05-29 (×5): qty 1

## 2017-05-29 MED ORDER — MEMANTINE HCL 5 MG PO TABS
10.0000 mg | ORAL_TABLET | Freq: Every day | ORAL | Status: DC
  Administered 2017-05-29 – 2017-05-31 (×3): 10 mg via ORAL
  Filled 2017-05-29 (×3): qty 2

## 2017-05-29 MED ORDER — ONDANSETRON HCL 4 MG/2ML IJ SOLN
4.0000 mg | Freq: Once | INTRAMUSCULAR | Status: AC
  Administered 2017-05-29: 4 mg via INTRAVENOUS

## 2017-05-29 MED ORDER — METOCLOPRAMIDE HCL 5 MG/ML IJ SOLN
5.0000 mg | Freq: Once | INTRAMUSCULAR | Status: AC
  Administered 2017-05-29: 5 mg via INTRAVENOUS
  Filled 2017-05-29: qty 2

## 2017-05-29 MED ORDER — SODIUM CHLORIDE 0.9 % IV SOLN
INTRAVENOUS | Status: DC
  Administered 2017-05-29 – 2017-05-31 (×3): via INTRAVENOUS

## 2017-05-29 MED ORDER — ONDANSETRON HCL 4 MG/2ML IJ SOLN
INTRAMUSCULAR | Status: AC
  Filled 2017-05-29: qty 2

## 2017-05-29 MED ORDER — ONDANSETRON HCL 4 MG/2ML IJ SOLN
4.0000 mg | Freq: Four times a day (QID) | INTRAMUSCULAR | Status: DC | PRN
  Administered 2017-05-29 – 2017-05-30 (×3): 4 mg via INTRAVENOUS
  Filled 2017-05-29 (×3): qty 2

## 2017-05-29 NOTE — ED Triage Notes (Signed)
Pt presents to ED today for nausea. Upon vitals pt has heart rate of 33. Pt pale and weakness with work of breathing noted.

## 2017-05-29 NOTE — Consult Note (Signed)
CARDIOLOGY CONSULT NOTE       Patient ID: Renee Cameron MRN: [id number] DOB/AGE: 1924-10-10 84 y.o.  Admit date: 05/29/2017 Referring Physician: Sabra Heck Primary Physician: Celene Squibb, MD Primary Cardiologist: Jacinta Shoe Reason for Consultation: Heart Block  Active Problems:   * No active hospital problems. *   HPI:  84 y.o. resident of [facility] with 36 hours of malaise, fatigue and nausea. History of chronic afib on beta blocker and anticoagulation In ER found to be in sinus with CHB rates 38-40 No chest pain or history of CAD. No vomiting fever diarrhea or melena No history of renal failure or hyperkalemia. Labs pending ECG with CHB PVC no acute MI no afib. She has been taking meds as indicated and not extra. Daughter with her today denies any mental status changes Patient ambulatory with good quality of life and willing to have PPM if needed   ROS All other systems reviewed and negative except as noted above  Past Medical History:  Diagnosis Date  . Atrial fibrillation ([place]) 1993   permanent; onset in 1993  . Chronic anticoagulation   . Dementia   . Hypertension    unspecified  . Hyperthyroidism 2000   2000  . Osteoporosis     Family History  Problem Relation Age of Onset  . Prostate cancer Father   . Stroke Father   . Heart disease Mother   . Coronary artery disease Sister        with prior CABG surgery    Social History   Socioeconomic History  . Marital status: Widowed    Spouse name: Not on file  . Number of children: Not on file  . Years of education: Not on file  . Highest education level: Not on file  Occupational History  . Occupation: Retired    Comment: [place]  . Financial resource strain: Not on file  . Food insecurity:    Worry: Not on file    Inability: Not on file  . Transportation needs:    Medical: Not on file    Non-medical: Not on file  Tobacco Use  . Smoking status: Never Smoker  . Smokeless  tobacco: Never Used  Substance and Sexual Activity  . Alcohol use: No    Alcohol/week: 0.0 oz  . Drug use: No  . Sexual activity: Not on file  Lifestyle  . Physical activity:    Days per week: Not on file    Minutes per session: Not on file  . Stress: Not on file  Relationships  . Social connections:    Talks on phone: Not on file    Gets together: Not on file    Attends religious service: Not on file    Active member of club or organization: Not on file    Attends meetings of clubs or organizations: Not on file    Relationship status: Not on file  . Intimate partner violence:    Fear of current or ex partner: Not on file    Emotionally abused: Not on file    Physically abused: Not on file    Forced sexual activity: Not on file  Other Topics Concern  . Not on file  Social History Narrative   Volunteers 2 days weekly    Past Surgical History:  Procedure Laterality Date  . APPENDECTOMY    . COLONOSCOPY  2005   Dr. Laural Golden: few small diverticula. external hemorrhoids  . TOTAL ABDOMINAL HYSTERECTOMY W/ BILATERAL SALPINGOOPHORECTOMY        .  sodium chloride 100 mL/hr at 05/29/17 1300    Physical Exam: Blood pressure (!) 171/74, pulse (!) 33, temperature (!) 97.5 F (36.4 C), temperature source Oral, resp. rate (!) 22, weight 134 lb (60.8 kg), SpO2 94 %.   Affect appropriate Healthy:  appears stated age 42: normal Neck supple with no adenopathy JVP normal no bruits no thyromegaly Lungs clear with no wheezing and good diaphragmatic motion Heart:  S1/S2 SEM  murmur, no rub, gallop or click PMI normal Abdomen: benighn, BS positve, no tenderness, no AAA no bruit.  No HSM or HJR post appy  Distal pulses intact with no bruits No edema Neuro non-focal Skin warm and dry No muscular weakness   Labs:   Lab Results  Component Value Date   WBC 6.5 04/27/2016   HGB 13.8 04/27/2016   HCT 41.7 04/27/2016   MCV 91.9 04/27/2016   PLT 184 04/27/2016   No results for  input(s): NA, K, CL, CO2, BUN, CREATININE, CALCIUM, PROT, BILITOT, ALKPHOS, ALT, AST, GLUCOSE in the last 168 hours.  Invalid input(s): LABALBU Lab Results  Component Value Date   TROPONINI 0.05 (HH) 04/27/2016    Lab Results  Component Value Date   CHOL 192 08/14/2010   CHOL 220 (H) 06/28/2010   CHOL 231 05/08/2007   Lab Results  Component Value Date   HDL 46 08/14/2010   HDL 41 06/28/2010   HDL 45 05/08/2007   Lab Results  Component Value Date   LDLCALC 113 (H) 08/14/2010   LDLCALC 145 (H) 06/28/2010   LDLCALC 149 05/08/2007   Lab Results  Component Value Date   TRIG 165 (H) 08/14/2010   TRIG 170 (H) 06/28/2010   TRIG 185 05/08/2007   Lab Results  Component Value Date   CHOLHDL 4.2 08/14/2010   CHOLHDL 5.4 06/28/2010   No results found for: LDLDIRECT    Radiology: No results found.  EKG: CHF with PVC no afib    ASSESSMENT AND PLAN:  CHB:  Unusual has she has chronic afib. Hold xarelot hold beta blocker transfer to [facility] if she does not improve AV conduction off beta blocker and no reversible causes will need PPM Friday. Check troponin, TSH , Cr , K  TTE as well   HTN;  Permissive BP while in CHB    Nausea:  Abdomen is benign r/o MI follow exam check amylase/lipase   [facility] called spoke with cardmaster arrange transfer   Signed: Jenkins Rouge 05/29/2017, 1:13 PM

## 2017-05-29 NOTE — ED Notes (Signed)
ED Provider at bedside. 

## 2017-05-29 NOTE — Plan of Care (Signed)
  Problem: Activity: Goal: Risk for activity intolerance will decrease Outcome: Progressing   Problem: Education: Goal: Knowledge of General Education information will improve Outcome: Completed/Met   

## 2017-05-29 NOTE — ED Notes (Signed)
CRITICAL VALUE ALERT  Critical Value:  Troponin 0.10  Date & Time Notied:  05/29/2017, 1352  Provider Notified: Dr. Sabra Heck,   Orders Received/Actions taken: See chart

## 2017-05-29 NOTE — ED Notes (Signed)
Pt has pads placed and is being monitored on the [facility]

## 2017-05-29 NOTE — Progress Notes (Signed)
Pt c/o nausea.  Idolina Primer, RN

## 2017-05-29 NOTE — ED Provider Notes (Signed)
[name] EMERGENCY DEPARTMENT Provider Note   CSN: [id number] Arrival date & time: 05/29/17  1211     History   Chief Complaint Chief Complaint  Patient presents with  . Bradycardia    HPI Renee Cameron is a 84 y.o. female.  HPI  The patient is a 84 year old female, she has a known history of permanent atrial fibrillation who currently takes metoprolol, she is also on lisinopril and Xarelto.  She noted last evening in the late afternoon that she was feeling generally weak with nausea and did not want to eat dinner last night.  Her symptoms persisted throughout the morning and have become quite severe prompting a visit to the emergency department.  She denies chest pain back pain swelling of the legs fevers chills vomiting or diarrhea.  She does not recall her last bowel movement but does not complain of any abdominal pain.  Specifically there is no chest pain at this time.  She has never had a heart attack that she knows of.  Her daughter accompanies her and give some additional information that she feels like her face may be a little bit swollen but otherwise mental status is at her baseline.  Past Medical History:  Diagnosis Date  . Atrial fibrillation ([place]) 1993   permanent; onset in 1993  . Chronic anticoagulation   . Dementia   . Hypertension    unspecified  . Hyperthyroidism 2000   2000  . Osteoporosis     Patient Active Problem List   Diagnosis Date Noted  . Complete heart block ([place]) 05/29/2017  . Elevated troponin 04/26/2016  . Elevated lipase 04/26/2016  . Dementia 04/26/2016  . Abdominal pain in female   . Diarrhea   . Rectus sheath hematoma   . Acute blood loss anemia 03/14/2014  . Bruise, trunk 03/14/2014  . Vomiting 03/11/2014  . CAP (community acquired pneumonia) 03/11/2014  . Hyponatremia 03/11/2014  . Nausea   . Encounter for therapeutic drug monitoring 02/05/2013  . Atrial fibrillation ([place])   . Hyperthyroidism   . Hypertension   .  Chronic anticoagulation     Past Surgical History:  Procedure Laterality Date  . APPENDECTOMY    . COLONOSCOPY  2005   Dr. Laural Golden: few small diverticula. external hemorrhoids  . TOTAL ABDOMINAL HYSTERECTOMY W/ BILATERAL SALPINGOOPHORECTOMY       OB History   None      Home Medications    Prior to Admission medications   Medication Sig Start Date End Date Taking? Authorizing Provider  ALPRAZolam Duanne Moron) 0.5 MG tablet Take 0.5 mg by mouth at bedtime as needed for anxiety or sleep.     [provider]  desvenlafaxine (PRISTIQ) 50 MG 24 hr tablet Take 1 tablet by mouth daily. 05/18/17   [provider]  lisinopril (PRINIVIL,ZESTRIL) 20 MG tablet TAKE 1 TABLET(20 MG) BY MOUTH DAILY 11/06/16   Herminio Commons, MD  memantine (NAMENDA) 10 MG tablet Take 1 tablet by mouth daily. 04/20/17   [provider]  metoprolol succinate (TOPROL-XL) 25 MG 24 hr tablet TAKE 1 TABLET BY MOUTH EVERY DAY 05/13/15   Herminio Commons, MD  Multiple Vitamins-Minerals (PRESERVISION AREDS 2+MULTI VIT) CAPS Take 2 capsules by mouth daily.    [provider]  XARELTO 15 MG TABS tablet TAKE 1 TABLET(15 MG) BY MOUTH DAILY WITH DINNER 10/30/16   Herminio Commons, MD    Family History Family History  Problem Relation Age of Onset  . Prostate cancer  Father   . Stroke Father   . Heart disease Mother   . Coronary artery disease Sister        with prior CABG surgery    Social History Social History   Tobacco Use  . Smoking status: Never Smoker  . Smokeless tobacco: Never Used  Substance Use Topics  . Alcohol use: No    Alcohol/week: 0.0 oz  . Drug use: No     Allergies   Patient has no known allergies.   Review of Systems Review of Systems  All other systems reviewed and are negative.    Physical Exam Updated Vital Signs BP 135/71 (BP Location: Left Arm)   Pulse (!) 40   Temp 98.2 F (36.8 C) (Oral)   Resp 16   Wt 60.8 kg (134 lb)   SpO2 (!)  76%   BMI 23.00 kg/m   Physical Exam  Constitutional: She appears well-developed and well-nourished. She appears distressed.  HENT:  Head: Normocephalic and atraumatic.  Mouth/Throat: Oropharynx is clear and moist. No oropharyngeal exudate.  Eyes: Pupils are equal, round, and reactive to light. Conjunctivae and EOM are normal. Right eye exhibits no discharge. Left eye exhibits no discharge. No scleral icterus.  Neck: Normal range of motion. Neck supple. No JVD present. No thyromegaly present.  Cardiovascular: Regular rhythm, normal heart sounds and intact distal pulses. Exam reveals no gallop and no friction rub.  No murmur heard. Bradycardia, strong pulses at the radial arteries, no JVD or peripheral edema  Pulmonary/Chest: Effort normal and breath sounds normal. No respiratory distress. She has no wheezes. She has no rales.  Abdominal: Soft. Bowel sounds are normal. She exhibits no distension and no mass. There is no tenderness.  Mild abdominal distention with tympanitic sounds to percussion, no tenderness  Musculoskeletal: Normal range of motion. She exhibits no edema or tenderness.  Lymphadenopathy:    She has no cervical adenopathy.  Neurological: She is alert. Coordination normal.  Awake alert and following commands, she appears generally weak but has no focal weakness.  Skin: Skin is warm and dry. No rash noted. No erythema.  Psychiatric: She has a normal mood and affect. Her behavior is normal.  Nursing note and vitals reviewed.    ED Treatments / Results  Labs (all labs ordered are listed, but only abnormal results are displayed) Labs Reviewed  COMPREHENSIVE METABOLIC PANEL - Abnormal; Notable for the following components:      Result Value   Sodium 133 (*)    Chloride 97 (*)    Glucose, Bld 145 (*)    Total Bilirubin 1.6 (*)    GFR calc non Af Amer 51 (*)    GFR calc Af Amer 59 (*)    All other components within normal limits  TROPONIN I - Abnormal; Notable for the  following components:   Troponin I 0.10 (*)    All other components within normal limits  CBC WITH DIFFERENTIAL/PLATELET - Abnormal; Notable for the following components:   WBC 11.2 (*)    Neutro Abs 9.3 (*)    All other components within normal limits  PROTIME-INR - Abnormal; Notable for the following components:   Prothrombin Time 16.3 (*)    All other components within normal limits    EKG EKG Interpretation  Date/Time:  Wednesday May 29 2017 12:36:55 EDT Ventricular Rate:  40 PR Interval:    QRS Duration: 144 QT Interval:  677 QTC Calculation: 553 R Axis:   -93 Text Interpretation:  Complete AV  block with wide QRS complex Ventricular premature complex Nonspecific IVCD with LAD Since last tracing now with what appear to be complete heart block / AV block / dissociation Confirmed by Noemi Chapel [phone]) on 05/29/2017 12:53:08 PM   Radiology Dg Chest Port 1 View  Result Date: 05/29/2017 CLINICAL DATA:  Nausea, weakness and bradycardia. EXAM: PORTABLE CHEST 1 VIEW COMPARISON:  04/26/2016 FINDINGS: Stable cardiac enlargement. Temporary pacing pads present. Lungs show no overt airspace edema but potentially mild interstitial edema. No pneumothorax or significant pleural effusions. IMPRESSION: Cardiac enlargement and potential mild interstitial edema. Electronically Signed   By: Aletta Edouard M.D.   On: 05/29/2017 13:20    Procedures .Critical Care Performed by: Noemi Chapel, MD Authorized by: Noemi Chapel, MD   Critical care provider statement:    Critical care time (minutes):  35   Critical care time was exclusive of:  Separately billable procedures and treating other patients and teaching time   Critical care was necessary to treat or prevent imminent or life-threatening deterioration of the following conditions:  Cardiac failure   Critical care was time spent personally by me on the following activities:  Blood draw for specimens, development of treatment plan with  patient or surrogate, discussions with consultants, evaluation of patient's response to treatment, examination of patient, obtaining history from patient or surrogate, ordering and performing treatments and interventions, ordering and review of laboratory studies, ordering and review of radiographic studies, pulse oximetry, re-evaluation of patient's condition and review of old charts   (including critical care time)  Medications Ordered in ED Medications  0.9 %  sodium chloride infusion ( Intravenous New Bag/Given 05/29/17 1300)  metoCLOPramide (REGLAN) injection 5 mg (5 mg Intravenous Given 05/29/17 1353)  ondansetron (ZOFRAN) injection 4 mg (4 mg Intravenous Given 05/29/17 1523)     Initial Impression / Assessment and Plan / ED Course  I have reviewed the triage vital signs and the nursing notes.  Pertinent labs & imaging results that were available during my care of the patient were reviewed by me and considered in my medical decision making (see chart for details).  Clinical Course as of May 30 1634  Wed May 29, 2017  1303 D/w Dr. Johnsie Cancel -commands hospitalist admission, 48 hours off the beta-blocker, sent to [facility], he will come by and consult in person and have the cardiology service consult on arrival to [facility].   [BM]  6045 CBC with mild leukocytosis - other labs pending at this time    [BM]  4098 Metabolic panel is remarkable only for a bilirubin of 1.6, sodium of 133, glucose of 145.  The troponin is elevated at 0.1 however it is unclear why this is elevated.  We will have this discussion with Dr. Johnsie Cancel   [BM]    Clinical Course User Index [BM] Noemi Chapel, MD   EKG is significantly abnormal showing a wide-complex bradycardia, there are no definite obvious P waves correlating with a QRS complexes however given her history of atrial fibrillation we may not be seeing P waves anyway.  Local care services provided for this patient who has a significant and potentially  life-threatening cardiac problem with an arrhythmia, multiple services involved including cardiology and hospitalist, pacer pads will be placed on the patient but at this time they are not needed.  Multiple repeat evaluations, ongoing bradycardia, ongoing nausea.  Avoid QT prolonging agents due to already having some electrical abnormality's of the heart.  Final Clinical Impressions(s) / ED Diagnoses  Final diagnoses:  Complete heart block (HCC)      Noemi Chapel, MD 05/29/17 [phone]

## 2017-05-29 NOTE — ED Notes (Signed)
Family at bedside. 

## 2017-05-30 ENCOUNTER — Inpatient Hospital Stay (HOSPITAL_COMMUNITY): Payer: Medicare Other

## 2017-05-30 ENCOUNTER — Encounter (HOSPITAL_COMMUNITY): Payer: Self-pay | Admitting: General Practice

## 2017-05-30 DIAGNOSIS — I361 Nonrheumatic tricuspid (valve) insufficiency: Secondary | ICD-10-CM

## 2017-05-30 LAB — MRSA PCR SCREENING: MRSA BY PCR: NEGATIVE

## 2017-05-30 LAB — ECHOCARDIOGRAM COMPLETE
Height: 64 in
WEIGHTICAEL: 2168 [oz_av]

## 2017-05-30 MED ORDER — PROCHLORPERAZINE EDISYLATE 10 MG/2ML IJ SOLN
10.0000 mg | Freq: Four times a day (QID) | INTRAMUSCULAR | Status: DC | PRN
Start: 2017-05-30 — End: 2017-06-01
  Administered 2017-05-30: 10 mg via INTRAVENOUS
  Filled 2017-05-30: qty 2

## 2017-05-30 NOTE — Progress Notes (Signed)
  Echocardiogram 2D Echocardiogram has been performed.  Renee Cameron 05/30/2017, 9:53 AM

## 2017-05-30 NOTE — Significant Event (Addendum)
Rapid Response Event Note  Overview: Bradycardia/CHB  Initial Focused Assessment: Per RN, HR in the 20s to mid 30s, has been in the 30s and patient has been endorsing nausea. Patient denies CP/SOB, denies being lightheaded, and denies dizziness. + pulses in all extremities, skin pink, warm, and dry to touch. Heart and lung sounds normal. SBP 130s, sats > 96% on RA, not in acute distress. Patient is currently denies abdominal pain.  Interventions: -- RN paged CARDS MD, MD came to bedside -- Compazine 10mg  IV x 1 for nausea -- Defib pads on patient. -- Received a call at 0354 about HR in the 20s, patient is clammy, and is endorsing back pain. When I arrived, CARDS MD also arrived, patient was neuro intact, skin warm and dry, capillary refill < 2 seconds, SBP in the 160s, HR in the 30s.   Plan of Care: -- Manage nausea with medications  -- Atropine and/or Dopamine if needed -- 0400- STAT LABS (Lactic Acid, BMP, CBC).   Event Summary:   at    [facility] Time 2255 End Time 2315  Second Call Time 0354 Second Arrival Time [place] Second End Time [place]  Renee Cameron

## 2017-05-30 NOTE — Clinical Social Work Note (Signed)
Clinical Social Work Assessment  Patient Details  Name: Renee Cameron MRN: [id number] Date of Birth: 06/08/1924  Date of referral:  05/30/17               Reason for consult:  Facility Placement, Discharge Planning                Permission sought to share information with:  Facility Contact Representative, Family Supports Permission granted to share information::  Yes, Verbal Permission Granted  Name::     Donna Setliff  Agency::  [facility]  Relationship::  daughter  Contact Information:  [phone]  Housing/Transportation Living arrangements for the past 2 months:  Assisted Living Facility, Single Family Home Source of Information:  Patient Patient Interpreter Needed:  None Criminal Activity/Legal Involvement Pertinent to Current Situation/Hospitalization:  No - Comment as needed Significant Relationships:  Adult Children, Other Family Members Lives with:  Facility Resident Do you feel safe going back to the place where you live?  Yes Need for family participation in patient care:  No (Coment)  Care giving concerns: Patient from [facility] in [place].    Social Worker assessment / plan: CSW met with patient at bedside. Patient alert and oriented, sitting up on edge of bed. CSW introduced self and role. Patient confirmed she lives at [facility]; she reported she just moved there a week ago and was living at home prior to that. Patient's daughter lives in  as well.   Patient reported she walks well without the use of a walker or cane. Noted possible plan for pace maker, pending medical workup. CSW to follow and support with disposition planning.  Employment status:  Retired Insurance information:  Medicare PT Recommendations:  Not assessed at this time Information / Referral to community resources:  Skilled Nursing Facility  Patient/Family's Response to care: Patient appreciative of care.  Patient/Family's Understanding of and Emotional Response  to Diagnosis, Current Treatment, and Prognosis: Patient articulated understanding of her medical conditions. Patient agreeable to return to ALF at discharge.  Emotional Assessment Appearance:  Appears stated age Attitude/Demeanor/Rapport:  Engaged Affect (typically observed):  Calm, Appropriate, Accepting, Pleasant Orientation:  Oriented to Self, Oriented to Place, Oriented to  Time, Oriented to Situation Alcohol / Substance use:  Not Applicable Psych involvement (Current and /or in the community):  No (Comment)  Discharge Needs  Concerns to be addressed:  Discharge Planning Concerns, Care Coordination Readmission within the last 30 days:  No Current discharge risk:  None Barriers to Discharge:  Continued Medical Work up    , LCSW 05/30/2017, 4:05 PM  

## 2017-05-30 NOTE — Consult Note (Addendum)
ELECTROPHYSIOLOGY CONSULT NOTE    Patient ID: Renee Cameron MRN: [id number], DOB/AGE: Feb 01, 1924 84 y.o.  Admit date: 05/29/2017 Date of Consult: 05/30/2017  Primary Physician: Celene Squibb, MD Primary Cardiologist: Bronson Ing Electrophysiologist: Lovena Le  Patient Profile: Renee Cameron is a 84 y.o. female with a history of permanent atrial fibrillation, hypertension, and hyperthyroidism who is being seen today for the evaluation of complete heart block at the request of Dr Johnsie Cancel.  HPI:  Renee Cameron is a 84 y.o. female with the above past medical history. She presented to [facility] yesterday with a 3 day history of nausea and fatigue. She states that she has had increasing memory difficulties for the last 3 months. She was found to be in complete heart block. She is on Toprol at home and took last dose 05/29/17 am.  She currently denies chest pain, shortness of breath, dizziness, syncope, or pre-syncope.  No recent fevers or chills. Echo is pending   Past Medical History:  Diagnosis Date  . Atrial fibrillation ([place]) 1993   permanent; onset in 1993  . Chronic anticoagulation   . Complete heart block ([place]) 05/2017  . Dementia   . Hypertension    unspecified  . Hyperthyroidism 2000   2000  . Osteoporosis      Surgical History:  Past Surgical History:  Procedure Laterality Date  . ABDOMINAL HYSTERECTOMY    . APPENDECTOMY    . COLONOSCOPY  2005   Dr. Laural Golden: few small diverticula. external hemorrhoids  . TOTAL ABDOMINAL HYSTERECTOMY W/ BILATERAL SALPINGOOPHORECTOMY       Medications Prior to Admission  Medication Sig Dispense Refill Last Dose  . ALPRAZolam (XANAX) 0.5 MG tablet Take 0.5 mg by mouth at bedtime as needed for anxiety or sleep.    Past Week at Unknown time  . desvenlafaxine (PRISTIQ) 50 MG 24 hr tablet Take 1 tablet by mouth daily.     Marland Kitchen lisinopril (PRINIVIL,ZESTRIL) 20 MG tablet TAKE 1 TABLET(20 MG) BY MOUTH DAILY 90 tablet 0   . memantine (NAMENDA)  10 MG tablet Take 1 tablet by mouth daily.     . metoprolol succinate (TOPROL-XL) 25 MG 24 hr tablet TAKE 1 TABLET BY MOUTH EVERY DAY 90 tablet 3 04/26/2016 at Unknown time  . Multiple Vitamins-Minerals (PRESERVISION AREDS 2+MULTI VIT) CAPS Take 2 capsules by mouth daily.   04/26/2016 at Unknown time  . XARELTO 15 MG TABS tablet TAKE 1 TABLET(15 MG) BY MOUTH DAILY WITH DINNER 30 tablet 6     Inpatient Medications:  . desvenlafaxine  50 mg Oral Daily  . memantine  10 mg Oral Daily    Allergies: No Known Allergies  Social History   Socioeconomic History  . Marital status: Widowed    Spouse name: Not on file  . Number of children: Not on file  . Years of education: Not on file  . Highest education level: Not on file  Occupational History  . Occupation: Retired    Comment: [employer]  . Financial resource strain: Not on file  . Food insecurity:    Worry: Not on file    Inability: Not on file  . Transportation needs:    Medical: Not on file    Non-medical: Not on file  Tobacco Use  . Smoking status: Never Smoker  . Smokeless tobacco: Never Used  Substance and Sexual Activity  . Alcohol use: No    Alcohol/week: 0.0 oz  . Drug use: No  .  Sexual activity: Not on file  Lifestyle  . Physical activity:    Days per week: Not on file    Minutes per session: Not on file  . Stress: Not on file  Relationships  . Social connections:    Talks on phone: Not on file    Gets together: Not on file    Attends religious service: Not on file    Active member of club or organization: Not on file    Attends meetings of clubs or organizations: Not on file    Relationship status: Not on file  . Intimate partner violence:    Fear of current or ex partner: Not on file    Emotionally abused: Not on file    Physically abused: Not on file    Forced sexual activity: Not on file  Other Topics Concern  . Not on file  Social History Narrative   Volunteers 2 days  weekly     Family History  Problem Relation Age of Onset  . Prostate cancer Father   . Stroke Father   . Heart disease Mother   . Coronary artery disease Sister        with prior CABG surgery     Review of Systems: All other systems reviewed and are otherwise negative except as noted above.  Physical Exam: Vitals:   05/29/17 1500 05/29/17 1624 05/29/17 1954 05/30/17 0429  BP: (!) 151/64 135/71 (!) 171/65 140/65  Pulse: (!) 38 (!) 40 (!) 39 (!) 33  Resp: (!) 23 16 15 12   Temp:  98.2 F (36.8 C) (!) 97.5 F (36.4 C) 98.5 F (36.9 C)  TempSrc:  Oral Oral Oral  SpO2: 91% 92% 95% 100%  Weight:  134 lb (60.8 kg)  135 lb 8 oz (61.5 kg)  Height:  5\' 4"  (1.626 m)      GEN- The patient is elderly appearing, alert and oriented x 3 today.   HEENT: normocephalic, atraumatic; sclera clear, conjunctiva pink; hearing intact; oropharynx clear; neck supple Lungs- Clear to ausculation bilaterally, normal work of breathing.  No wheezes, rales, rhonchi Heart- Bradycardic regular rate and rhythm  GI- soft, non-tender, non-distended, bowel sounds present Extremities- no clubbing, cyanosis, or edema  MS- no significant deformity or atrophy Skin- warm and dry, no rash or lesion Psych- euthymic mood, full affect Neuro- strength and sensation are intact  Labs:   Lab Results  Component Value Date   WBC 11.2 (H) 05/29/2017   HGB 13.4 05/29/2017   HCT 41.1 05/29/2017   MCV 93.4 05/29/2017   PLT 186 05/29/2017    Recent Labs  Lab 05/29/17 1254  NA 133*  K 4.3  CL 97*  CO2 27  BUN 19  CREATININE 0.95  CALCIUM 9.5  PROT 7.2  BILITOT 1.6*  ALKPHOS 78  ALT 33  AST 33  GLUCOSE 145*      Radiology/Studies: Dg Chest Port 1 View  Result Date: 05/29/2017 CLINICAL DATA:  Nausea, weakness and bradycardia. EXAM: PORTABLE CHEST 1 VIEW COMPARISON:  04/26/2016 FINDINGS: Stable cardiac enlargement. Temporary pacing pads present. Lungs show no overt airspace edema but potentially mild  interstitial edema. No pneumothorax or significant pleural effusions. IMPRESSION: Cardiac enlargement and potential mild interstitial edema. Electronically Signed   By: Aletta Edouard M.D.   On: 05/29/2017 13:20    EKG:AF with complete heart block (personally reviewed)  TELEMETRY: complete heart block, V rates 30's (personally reviewed)  Assessment/Plan: 1.  Complete heart block Symptomatic in the setting of  BB use Will monitor for return of conduction today If persistent heart block tomorrow, plan pacemaker implantation Update echo  2.  HTN Stable No change required today  Dr Lovena Le to see later today  Signed, Chanetta Marshall, NP 05/30/2017 8:18 AM  EP attending  Patient seen and examined.  Agree with the findings as noted above.  She has presented with complete heart block with underlying atrial fibrillation and a ventricular rate of 33 bpm.  She is symptomatic.  Exam reveals a pleasant elderly woman in no acute distress.  Cardiovascular exam revealed regular bradycardia.  Lungs are clear bilaterally.  Extremities were warm with no edema.  She took Toprol yesterday.  She has not had syncope but does note fatigue and shortness of breath and weakness.  I discussed the treatment options with the patient.  We will give her an additional 24 hours to wash out her Toprol.  If she has no return of her conduction, she will undergo permanent pacemaker insertion tomorrow.   Cristopher Peru, MD

## 2017-05-31 ENCOUNTER — Encounter (HOSPITAL_COMMUNITY)
Admission: EM | Disposition: A | Discharge status: Home / Self Care | Payer: Self-pay | Source: Other Acute Inpatient Hospital | Attending: Cardiovascular Disease

## 2017-05-31 HISTORY — PX: PACEMAKER IMPLANT: EP1218

## 2017-05-31 LAB — LACTIC ACID, PLASMA
LACTIC ACID, VENOUS: 1.4 mmol/L (ref 0.5–1.9)
LACTIC ACID, VENOUS: 1.3 mmol/L (ref 0.5–1.9)

## 2017-05-31 LAB — BASIC METABOLIC PANEL
Anion gap: 11 (ref 5–15)
BUN: 21 mg/dL — ABNORMAL HIGH (ref 6–20)
CHLORIDE: 103 mmol/L (ref 101–111)
CO2: 20 mmol/L — AB (ref 22–32)
CREATININE: 1.01 mg/dL — AB (ref 0.44–1.00)
Calcium: 8.7 mg/dL — ABNORMAL LOW (ref 8.9–10.3)
GFR calc Af Amer: 55 mL/min — ABNORMAL LOW (ref >60)
GFR calc non Af Amer: 47 mL/min — ABNORMAL LOW (ref >60)
Glucose, Bld: 106 mg/dL — ABNORMAL HIGH (ref 65–99)
Potassium: 4.3 mmol/L (ref 3.5–5.1)
Sodium: 134 mmol/L — ABNORMAL LOW (ref 135–145)

## 2017-05-31 LAB — SURGICAL PCR SCREEN
MRSA, PCR: NEGATIVE
STAPHYLOCOCCUS AUREUS: NEGATIVE

## 2017-05-31 LAB — CBC
HEMATOCRIT: 38.9 % (ref 36.0–46.0)
HEMOGLOBIN: 12.3 g/dL (ref 12.0–15.0)
MCH: 30 pg (ref 26.0–34.0)
MCHC: 31.6 g/dL (ref 30.0–36.0)
MCV: 94.9 fL (ref 78.0–100.0)
Platelets: 146 10*3/uL — ABNORMAL LOW (ref 150–400)
RBC: 4.1 MIL/uL (ref 3.87–5.11)
RDW: 14 % (ref 11.5–15.5)
WBC: 9.9 10*3/uL (ref 4.0–10.5)

## 2017-05-31 LAB — PROTIME-INR
INR: 1.25
Prothrombin Time: 15.6 seconds — ABNORMAL HIGH (ref 11.4–15.2)

## 2017-05-31 LAB — APTT: APTT: 34 s (ref 24–36)

## 2017-05-31 SURGERY — PACEMAKER IMPLANT

## 2017-05-31 MED ORDER — METOPROLOL SUCCINATE ER 25 MG PO TB24
25.0000 mg | ORAL_TABLET | Freq: Every day | ORAL | Status: DC
  Administered 2017-05-31 – 2017-06-01 (×2): 25 mg via ORAL
  Filled 2017-05-31 (×2): qty 1

## 2017-05-31 MED ORDER — SODIUM CHLORIDE 0.9 % IV SOLN
80.0000 mg | INTRAVENOUS | Status: DC
  Filled 2017-05-31: qty 2

## 2017-05-31 MED ORDER — SODIUM CHLORIDE 0.9 % IV SOLN
INTRAVENOUS | Status: AC
  Filled 2017-05-31: qty 2

## 2017-05-31 MED ORDER — MEGARED OMEGA-3 KRILL OIL 500 MG PO CAPS: 500.0000 mg | ORAL_CAPSULE | Freq: Every day | ORAL | Status: DC

## 2017-05-31 MED ORDER — CEFAZOLIN SODIUM-DEXTROSE 2-4 GM/100ML-% IV SOLN
2.0000 g | INTRAVENOUS | Status: AC
  Administered 2017-05-31: 2 g via INTRAVENOUS
  Filled 2017-05-31: qty 100

## 2017-05-31 MED ORDER — ONDANSETRON HCL 4 MG/2ML IJ SOLN: 4.0000 mg | Freq: Four times a day (QID) | INTRAMUSCULAR | Status: DC | PRN

## 2017-05-31 MED ORDER — LIDOCAINE HCL (PF) 1 % IJ SOLN
INTRAMUSCULAR | Status: AC
  Filled 2017-05-31: qty 60

## 2017-05-31 MED ORDER — GENTAMICIN SULFATE 40 MG/ML IJ SOLN
INTRAMUSCULAR | Status: DC | PRN
  Administered 2017-05-31: 500 mL

## 2017-05-31 MED ORDER — DONEPEZIL HCL 5 MG PO TABS
5.0000 mg | ORAL_TABLET | Freq: Every day | ORAL | Status: DC
  Administered 2017-05-31: 5 mg via ORAL
  Filled 2017-05-31: qty 1

## 2017-05-31 MED ORDER — ACETAMINOPHEN 325 MG PO TABS
650.0000 mg | ORAL_TABLET | ORAL | Status: DC | PRN
  Administered 2017-05-31: 650 mg via ORAL
  Filled 2017-05-31: qty 2

## 2017-05-31 MED ORDER — ACETAMINOPHEN 325 MG PO TABS: 325.0000 mg | ORAL_TABLET | ORAL | Status: DC | PRN

## 2017-05-31 MED ORDER — CHLORHEXIDINE GLUCONATE 4 % EX LIQD: 60.0000 mL | Freq: Once | CUTANEOUS | Status: DC

## 2017-05-31 MED ORDER — CEFAZOLIN SODIUM-DEXTROSE 2-3 GM-%(50ML) IV SOLR: INTRAVENOUS | Status: DC | PRN

## 2017-05-31 MED ORDER — HEPARIN (PORCINE) IN NACL 2-0.9 UNITS/ML
INTRAMUSCULAR | Status: AC | PRN
  Administered 2017-05-31: 500 mL

## 2017-05-31 MED ORDER — METOPROLOL TARTRATE 5 MG/5ML IV SOLN
INTRAVENOUS | Status: DC | PRN
  Administered 2017-05-31: 5 mg via INTRAVENOUS

## 2017-05-31 MED ORDER — PRESERVISION AREDS 2+MULTI VIT PO CAPS: 2.0000 | ORAL_CAPSULE | Freq: Every day | ORAL | Status: DC

## 2017-05-31 MED ORDER — LIDOCAINE HCL (PF) 1 % IJ SOLN
INTRAMUSCULAR | Status: DC | PRN
  Administered 2017-05-31: 80 mL via INTRADERMAL

## 2017-05-31 MED ORDER — HEPARIN SODIUM (PORCINE) 1000 UNIT/ML IJ SOLN
INTRAMUSCULAR | Status: AC
  Filled 2017-05-31: qty 1

## 2017-05-31 MED ORDER — HEPARIN (PORCINE) IN NACL 1000-0.9 UT/500ML-% IV SOLN
INTRAVENOUS | Status: AC
  Filled 2017-05-31: qty 500

## 2017-05-31 MED ORDER — LIDOCAINE HCL (PF) 1 % IJ SOLN
INTRAMUSCULAR | Status: AC
  Filled 2017-05-31: qty 30

## 2017-05-31 MED ORDER — METOPROLOL TARTRATE 5 MG/5ML IV SOLN
INTRAVENOUS | Status: AC
  Filled 2017-05-31: qty 5

## 2017-05-31 MED ORDER — LISINOPRIL 5 MG PO TABS
5.0000 mg | ORAL_TABLET | Freq: Every day | ORAL | Status: DC
  Administered 2017-05-31: 5 mg via ORAL
  Filled 2017-05-31 (×2): qty 1

## 2017-05-31 MED ORDER — CHLORHEXIDINE GLUCONATE 4 % EX LIQD
60.0000 mL | Freq: Once | CUTANEOUS | Status: AC
  Administered 2017-05-31: 4 via TOPICAL
  Filled 2017-05-31: qty 60

## 2017-05-31 MED ORDER — CEFAZOLIN SODIUM-DEXTROSE 1-4 GM/50ML-% IV SOLN
1.0000 g | Freq: Four times a day (QID) | INTRAVENOUS | Status: AC
  Administered 2017-05-31 – 2017-06-01 (×3): 1 g via INTRAVENOUS
  Filled 2017-05-31 (×3): qty 50

## 2017-05-31 MED ORDER — CEFAZOLIN SODIUM-DEXTROSE 2-4 GM/100ML-% IV SOLN
INTRAVENOUS | Status: AC
  Filled 2017-05-31: qty 100

## 2017-05-31 SURGICAL SUPPLY — 7 items
CABLE SURGICAL S-101-97-12 (CABLE) ×2 IMPLANT
LEAD TENDRIL MRI 46CM LPA1200M (Lead) ×2 IMPLANT
LEAD TENDRIL MRI 52CM LPA1200M (Lead) ×2 IMPLANT
PACEMAKER ASSURITY DR-RF (Pacemaker) ×2 IMPLANT
PAD DEFIB LIFELINK (PAD) ×2 IMPLANT
SHEATH CLASSIC 8F (SHEATH) ×4 IMPLANT
TRAY PACEMAKER INSERTION (PACKS) ×3 IMPLANT

## 2017-05-31 NOTE — Consult Note (Signed)
            [facility] CM Primary Care Navigator  05/31/2017  Renee Cameron 1924-12-12 [id number]   Went to see patient at the bedside to identify possible discharge but staff reports that patient is off the unit for a procedure at cath lab. (PPM- permanent pacemaker implant)   Will attempt to see patientat another time once she is available in the room.     Addendum:    Went back to seepatient at the bedside to identify possible discharge needs butshe was already discharged over the weekend.  Patient is from [facility] in [place] and returned back to this facility after discharge.  Per chart review, patient was treated for symptomatic complete heart block and status post pacemaker implantation on this admission.  Patient has discharge instruction to follow-up with primary care provider, cardiology on 06/13/17.  Primary care provider's office called(Megan)to notify of patient's discharge andneed for post hospital follow-up and transition of care (TOC). Notified of health issues needing follow-up as well. Was notified that patient has a scheduled follow-up with primary care provider on 06/06/17.  Made aware to refer patient to [facility] CM if deemed necessary and appropriate for services.   For additional questions please contact:  Edwena Felty A. Kadie Balestrieri, BSN, RN-BC [facility] PRIMARY CARE Navigator Cell: [phone]

## 2017-05-31 NOTE — Progress Notes (Addendum)
Electrophysiology Rounding Note  Patient Name: Renee Cameron Date of Encounter: 05/31/2017  Primary Cardiologist: Bronson Ing Electrophysiologist: Lovena Le   Subjective   The patient is doing well today.  At this time, the patient denies chest pain, shortness of breath, or any new concerns.  Inpatient Medications    Scheduled Meds: . desvenlafaxine  50 mg Oral Daily  . memantine  10 mg Oral Daily   Continuous Infusions: . sodium chloride 100 mL/hr at 05/31/17 0130   PRN Meds: acetaminophen, ALPRAZolam, ondansetron (ZOFRAN) IV, prochlorperazine   Vital Signs    Vitals:   05/30/17 2251 05/31/17 0346 05/31/17 0405 05/31/17 0627  BP: (!) 155/59 (!) 175/62 (!) 169/61   Pulse: (!) 36 (!) 34 (!) 37   Resp: (!) 9 12 17    Temp:  98 F (36.7 C)  98.3 F (36.8 C)  TempSrc:  Oral  Oral  SpO2: 99% 99% 97%   Weight:      Height:        Intake/Output Summary (Last 24 hours) at 05/31/2017 0737 Last data filed at 05/31/2017 0400 Gross per 24 hour  Intake 1210 ml  Output -  Net 1210 ml   Filed Weights   05/29/17 1233 05/29/17 1624 05/30/17 0429  Weight: 134 lb (60.8 kg) 134 lb (60.8 kg) 135 lb 8 oz (61.5 kg)    Physical Exam    GEN- The patient is elderly appearing, alert and oriented x 3 today.   Head- normocephalic, atraumatic Eyes-  Sclera clear, conjunctiva pink Ears- hearing intact Oropharynx- clear Neck- supple Lungs- Clear to ausculation bilaterally, normal work of breathing Heart- Bradycardic regular rate and rhythm  GI- soft, NT, ND, + BS Extremities- no clubbing, cyanosis, or edema Skin- no rash or lesion Psych- euthymic mood, full affect Neuro- strength and sensation are intact  Labs    CBC Recent Labs    05/29/17 1254 05/31/17 0441  WBC 11.2* 9.9  NEUTROABS 9.3*  --   HGB 13.4 12.3  HCT 41.1 38.9  MCV 93.4 94.9  PLT 186 950*   Basic Metabolic Panel Recent Labs    05/29/17 1254 05/29/17 1726 05/31/17 0441  NA 133*  --  134*  K 4.3   --  4.3  CL 97*  --  103  CO2 27  --  20*  GLUCOSE 145*  --  106*  BUN 19  --  21*  CREATININE 0.95  --  1.01*  CALCIUM 9.5  --  8.7*  MG  --  1.8  --    Liver Function Tests Recent Labs    05/29/17 1254  AST 33  ALT 33  ALKPHOS 78  BILITOT 1.6*  PROT 7.2  ALBUMIN 4.3   Cardiac Enzymes Recent Labs    05/29/17 1254  TROPONINI 0.10*   Thyroid Function Tests Recent Labs    05/29/17 1726  TSH 8.847*    Telemetry    SR with complete heart block  (personally reviewed)  Radiology    Dg Chest Port 1 View  Result Date: 05/29/2017 CLINICAL DATA:  Nausea, weakness and bradycardia. EXAM: PORTABLE CHEST 1 VIEW COMPARISON:  04/26/2016 FINDINGS: Stable cardiac enlargement. Temporary pacing pads present. Lungs show no overt airspace edema but potentially mild interstitial edema. No pneumothorax or significant pleural effusions. IMPRESSION: Cardiac enlargement and potential mild interstitial edema. Electronically Signed   By: Aletta Edouard M.D.   On: 05/29/2017 13:20    Assessment & Plan    1.  Complete heart block  Plan for PPM implant today. Risks, benefits reviewed with patient and daughter who wish to proceed. She is in SR this morning, will plan for dual chamber PPM  2.  HTN Will re-evaluate after PPM implant  Signed, Chanetta Marshall, NP  05/31/2017, 7:37 AM   EP Attending  Patient seen and examined. Agree with above. The patient is still in CHB but she has reverted to NSR. Her rates remain in the low/mid 30's and her toprol has had 48 hours to wash out. I have reviewed the indications/risks/benefits/goals/expectations of PPM insertion with the patient and her daughter and they wish to proceed.   Mikle Bosworth.D.

## 2017-05-31 NOTE — NC FL2 (Signed)
[place] LEVEL OF CARE SCREENING TOOL     IDENTIFICATION  Patient Name: Renee Cameron Birthdate: 06-23-1924 Sex: female Admission Date (Current Location): 05/29/2017  [facility] and [place] Number:  Herbalist and Address:  [facility], [place] [address], [place], [place] [address]      Provider Number: [id number]  Attending Physician Name and Address:  Lorretta Harp, MD  Relative Name and Phone Number:  Dorann Lodge, daughter, [phone]    Current Level of Care: Hospital Recommended Level of Care: [employer] Prior Approval Number:    Date Approved/Denied:   PASRR Number:    Discharge Plan: Domiciliary (Rest home)(ALF)    Current Diagnoses: Patient Active Problem List   Diagnosis Date Noted  . Complete heart block ([place]) 05/29/2017  . Elevated troponin 04/26/2016  . Elevated lipase 04/26/2016  . Dementia 04/26/2016  . Abdominal pain in female   . Diarrhea   . Rectus sheath hematoma   . Acute blood loss anemia 03/14/2014  . Bruise, trunk 03/14/2014  . Vomiting 03/11/2014  . CAP (community acquired pneumonia) 03/11/2014  . Hyponatremia 03/11/2014  . Nausea   . Encounter for therapeutic drug monitoring 02/05/2013  . Atrial fibrillation ([place])   . Hyperthyroidism   . Hypertension   . Chronic anticoagulation     Orientation RESPIRATION BLADDER Height & Weight     Self, Time, Situation, Place  O2(nasal cannula 2L) Continent Weight: 135 lb 8 oz (61.5 kg) Height:  5\' 4"  (162.6 cm)  BEHAVIORAL SYMPTOMS/MOOD NEUROLOGICAL BOWEL NUTRITION STATUS      Continent Diet(please see DC summary)  AMBULATORY STATUS COMMUNICATION OF NEEDS Skin   (baseline) Verbally Normal                       Personal Care Assistance Level of Assistance  Bathing, Feeding, Dressing(baseline)           Functional Limitations Info  Sight, Hearing, Speech Sight Info: Adequate Hearing Info: Adequate Speech Info:  Adequate    SPECIAL CARE FACTORS FREQUENCY                       Contractures Contractures Info: Not present    Additional Factors Info  Code Status, Allergies, Psychotropic Code Status Info: Prior Allergies Info: No Known Allergies Psychotropic Info: pristiq, namenda         Current Medications (05/31/2017):  This is the current hospital active medication list Current Facility-Administered Medications  Medication Dose Route Frequency Provider Last Rate Last Dose  . 0.9 %  sodium chloride infusion   Intravenous Continuous Noemi Chapel, MD 100 mL/hr at 05/31/17 1220    . acetaminophen (TYLENOL) tablet 650 mg  650 mg Oral Q4H PRN Lujean Amel, MD   650 mg at 05/31/17 0418  . ALPRAZolam Duanne Moron) tablet 0.5 mg  0.5 mg Oral QHS PRN Ledora Bottcher, PA   0.5 mg at 05/29/17 2033  . ceFAZolin (ANCEF) IVPB 2g/100 mL premix  2 g Intravenous On Call Chanetta Marshall K, NP      . chlorhexidine (HIBICLENS) 4 % liquid 4 application  60 mL Topical Once Chanetta Marshall K, NP      . desvenlafaxine (PRISTIQ) 24 hr tablet 50 mg  50 mg Oral Daily Ledora Bottcher, PA   50 mg at 05/30/17 1714  . gentamicin (GARAMYCIN) 80 mg in sodium chloride 0.9 % 500 mL irrigation  80 mg  Irrigation On Call Lynnell Jude, [employer], NP      . memantine [facility]) tablet 10 mg  10 mg Oral Daily Ledora Bottcher, [place]   10 mg at 05/30/17 2036  . ondansetron (ZOFRAN) injection 4 mg  4 mg Intravenous Q6H PRN Ledora Bottcher, PA   4 mg at 05/30/17 2141  . prochlorperazine (COMPAZINE) injection 10 mg  10 mg Intravenous Q6H PRN Lujean Amel, MD   10 mg at 05/30/17 2306     Discharge Medications: Please see discharge summary for a list of discharge medications.  Relevant Imaging Results:  Relevant Lab Results:   Additional Information SSN: [id number]  Estanislado Emms, LCSW

## 2017-05-31 NOTE — Significant Event (Signed)
Called to patient's bedside for recurrence of extreme bradycardia with heart rates in the 20s to 30s.  Patient admitted for A. fib with complete heart block and is planned for permanent pacemaker placement in the morning.  She has been in the 30s throughout her admission; however, has dipped into the 20s several times this evening.  She has no symptoms of chest pain or shortness of breath; however, she is having intermittent nausea.  Compazine given earlier in the evening with some relief of symptoms.  Called again given recurrence of bradycardia and RN noting that patient is diaphoretic.  On my evaluation patient is alert and oriented, blood pressure is 175/62, heart rate in the 30s to high 20s.  Her cap refill is less than 1 second, her extremities are warm and well-perfused.  Given that she has a definitive solution to this problem pending in the morning and appears to be clinically stable at the moment, will not plan for a transvenous pacing wire at this time.  Should the patient develop hypotension, cool extremities, confusion, chest pain could consider infusion of dopamine or atropine.  Will confirm that she is adequately perfused by checking a lactate with morning labs this morning.  Magda Paganini, MD Cardiology fellow 4:18 AM 05/31/17

## 2017-05-31 NOTE — Discharge Summary (Addendum)
ELECTROPHYSIOLOGY PROCEDURE DISCHARGE SUMMARY    Patient ID: MELLANIE BEJARANO,  MRN: [id number], DOB/AGE: March 21, 1924 84 y.o.  Admit date: 05/29/2017 Discharge date: 06/01/2017  Primary Care Physician: Celene Squibb, MD Primary Cardiologist: Bronson Ing Electrophysiologist: Lovena Le  Primary Discharge Diagnosis:  Symptomatic complete heart block status post pacemaker implantation this admission  Secondary Discharge Diagnosis:  1.  Persistent atrial fibrillation 2.  Hypertension 3.  Hyperthyroidism  No Known Allergies   Procedures This Admission:  1.  Implantation of a St. Jude dual chamber PPM on 05/31/17 by Dr Lovena Le. See op note for full details.  There were no immediate post procedure complications. 2.  CXR on 06/01/17 demonstrated no pneumothorax status post device implantation.   Brief HPI/Hospital Course:  WILMER BERRYHILL is a 84 y.o. female [place] resident with permanent atrial fib, HTN, hyperthyroidism, dementia.  She presented to [facility] with nausea and fatigue. She was found to be in complete heart block. Despite age it was felt she had relatively good quality of life and was willing to undergo PPM if needed. She had no mental status changes. She was transferred to [facility] for further evaluation. Her heart block persisted despite BB washout. Risks, benefits, and alternatives to PPM implantation were reviewed with the patient who wished to proceed. The patient underwent implantation of a dual chamber PPM with details as outlined above.  She  was monitored on telemetry overnight which demonstrated V pacing.  Left chest was without hematoma or ecchymosis.  The device was interrogated and found to be functioning normally.  CXR was obtained and demonstrated no pneumothorax status post device implantation.  Wound care, arm mobility, and restrictions were reviewed with the patient.  The patient was examined and considered stable for discharge to home. F/u was arranged by EP  team per usual protocol with wound check and subsequent visit. EP team deemed no driving in setting of hx of dementia.  Per Dr. Lovena Le, Xarelto should not be resumed until Tuesday 06/04/17 - outlined this in 2 separate places on her AVS. Her lisinopril and metoprolol were resumed. She was temporarily on lisinopril 5mg  as inpatient but this was resumed at home dose at DC given elevated BP. She does have upcoming f/u with Dr. Bronson Ing which was pre-arranged, will keep this appt for BP check. Of note, TSH was elevated at 8.8. She was advised to f/u further with PCP for possible med adjustment. Dr. Lovena Le has seen and examined the patient today and feels she is stable for discharge.    Physical Exam: Vitals:   05/31/17 2212 05/31/17 2243 06/01/17 0639 06/01/17 0641  BP: (!) 162/82 140/81  (!) 160/79  Pulse: 72 72  73  Resp: 18 (!) 22  (!) 26  Temp:    98.2 F (36.8 C)  TempSrc:    Oral  SpO2: 90% 95%  92%  Weight:   137 lb 3.2 oz (62.2 kg)   Height:        Labs:   Lab Results  Component Value Date   WBC 9.9 05/31/2017   HGB 12.3 05/31/2017   HCT 38.9 05/31/2017   MCV 94.9 05/31/2017   PLT 146 (L) 05/31/2017    Recent Labs  Lab 05/29/17 1254 05/31/17 0441  NA 133* 134*  K 4.3 4.3  CL 97* 103  CO2 27 20*  BUN 19 21*  CREATININE 0.95 1.01*  CALCIUM 9.5 8.7*  PROT 7.2  --   BILITOT 1.6*  --   ALKPHOS  78  --   ALT 33  --   AST 33  --   GLUCOSE 145* 106*    Discharge Medications:  Allergies as of 06/01/2017   No Known Allergies     Medication List    TAKE these medications   ALPRAZolam 0.5 MG tablet Commonly known as:  XANAX Take 0.5 mg by mouth 2 (two) times daily.   desvenlafaxine 50 MG 24 hr tablet Commonly known as:  PRISTIQ Take 1 tablet by mouth daily.   donepezil 5 MG tablet Commonly known as:  ARICEPT Take 5 mg by mouth at bedtime.   lisinopril 20 MG tablet Commonly known as:  PRINIVIL,ZESTRIL TAKE 1 TABLET(20 MG) BY MOUTH DAILY   MEGARED OMEGA-3  KRILL OIL 500 MG Caps Take 500 mg by mouth daily at 12 noon.   memantine 10 MG tablet Commonly known as:  NAMENDA Take 1 tablet by mouth daily.   metoprolol succinate 25 MG 24 hr tablet Commonly known as:  TOPROL-XL TAKE 1 TABLET BY MOUTH EVERY DAY   PRESERVISION AREDS 2+MULTI VIT Caps Take 2 capsules by mouth daily.   XARELTO 15 MG Tabs tablet Generic drug:  Rivaroxaban TAKE 1 TABLET(15 MG) BY MOUTH DAILY WITH DINNER       Disposition:  Discharge Instructions    Diet - low sodium heart healthy   Complete by:  As directed    Discharge instructions   Complete by:  As directed    IMPORTANT: Do not restart your Xarelto until Tuesday 06/04/17.   Increase activity slowly   Complete by:  As directed    Please see attached sheet at the end of your After-Visit Summary for instructions on wound care, activity, and bathing.     Follow-up Information    Adrian Follow up on 06/13/2017.   Specialty:  Cardiology Why:  at [facility] - this is for a wound check visit at our [facility] location. Contact information: [address] [place]       Evans Lance, MD Follow up on 09/12/2017.   Specialty:  Cardiology Why:  at 10:15AM Contact information: [place] [address] [phone]        Celene Squibb, MD Follow up.   Specialty:  Internal Medicine Why:  TSH was elevated suggesting possible low thyroid - follow up with primary care/nursing home doctor for recheck. Contact information: [facility] [place] [address] [phone]        Herminio Commons, MD .   Specialty:  Cardiology Contact information: [address] [address] [phone]           Duration of Discharge Encounter: Greater than 30 minutes including physician time.  Signed, Chanetta Marshall, NP 05/31/2017 2:41 PM  Previously started discharge summary was addended/updated on  day of discharge to reflect most current information at time of discharge. Lisbeth Renshaw Dunn PA-C 06/01/2017 9:38 AM  EP Attending  Patient seen and examined. Agree with above. See my note as well.  Mikle Bosworth.D.

## 2017-06-01 ENCOUNTER — Inpatient Hospital Stay (HOSPITAL_COMMUNITY): Payer: Medicare Other

## 2017-06-01 ENCOUNTER — Encounter (HOSPITAL_COMMUNITY): Payer: Self-pay | Admitting: Physician Assistant

## 2017-06-01 DIAGNOSIS — R946 Abnormal results of thyroid function studies: Secondary | ICD-10-CM

## 2017-06-01 MED ORDER — LISINOPRIL 20 MG PO TABS
20.0000 mg | ORAL_TABLET | Freq: Every day | ORAL | Status: DC
  Administered 2017-06-01: 20 mg via ORAL
  Filled 2017-06-01: qty 1

## 2017-06-01 NOTE — NC FL2 (Deleted)
[place] LEVEL OF CARE SCREENING TOOL     IDENTIFICATION  Patient Name: Renee Cameron Birthdate: [birth year] Sex: female Admission Date (Current Location): 05/29/2017  [facility] and [place] Number:  Herbalist and Address:  [facility] [place]. [facility], [place] [address] [place], [place] [address]      Provider Number: [id number]  Attending Physician Name and Address:  No att. providers found  Relative Name and Phone Number:  Dorann Lodge, daughter, [phone]    Current Level of Care: Hospital Recommended Level of Care: [place] Prior Approval Number:    Date Approved/Denied:   PASRR Number:    Discharge Plan: Domiciliary (Rest home)(ALF)    Current Diagnoses: Patient Active Problem List   Diagnosis Date Noted  . Abnormal thyroid function test 06/01/2017  . Complete heart block ([place]) 05/29/2017  . Elevated troponin 04/26/2016  . Elevated lipase 04/26/2016  . Dementia 04/26/2016  . Abdominal pain in female   . Diarrhea   . Rectus sheath hematoma   . Acute blood loss anemia 03/14/2014  . Bruise, trunk 03/14/2014  . Vomiting 03/11/2014  . CAP (community acquired pneumonia) 03/11/2014  . Hyponatremia 03/11/2014  . Nausea   . Encounter for therapeutic drug monitoring 02/05/2013  . Permanent atrial fibrillation ([place])   . Hyperthyroidism   . Hypertension   . Chronic anticoagulation     Orientation RESPIRATION BLADDER Height & Weight     Self, Time, Situation, Place  O2(nasal cannula 2L) Continent Weight: 137 lb 3.2 oz (62.2 kg) Height:  5\' 4"  (162.6 cm)  BEHAVIORAL SYMPTOMS/MOOD NEUROLOGICAL BOWEL NUTRITION STATUS      Continent Diet(please see DC summary)  AMBULATORY STATUS COMMUNICATION OF NEEDS Skin   (baseline) Verbally Normal                       Personal Care Assistance Level of Assistance  Bathing, Feeding, Dressing(baseline)           Functional Limitations Info  Sight, Hearing, Speech  Sight Info: Adequate Hearing Info: Adequate Speech Info: Adequate    SPECIAL CARE FACTORS FREQUENCY                       Contractures Contractures Info: Not present    Additional Factors Info  Code Status, Allergies, Psychotropic Code Status Info: Prior Allergies Info: No Known Allergies Psychotropic Info: pristiq, namenda         Current Medications (06/01/2017):   TAKE these medications   ALPRAZolam 0.5 MG tablet Commonly known as:  XANAX Take 0.5 mg by mouth 2 (two) times daily.   desvenlafaxine 50 MG 24 hr tablet Commonly known as:  PRISTIQ Take 1 tablet by mouth daily.   donepezil 5 MG tablet Commonly known as:  ARICEPT Take 5 mg by mouth at bedtime.   lisinopril 20 MG tablet Commonly known as:  PRINIVIL,ZESTRIL TAKE 1 TABLET(20 MG) BY MOUTH DAILY   MEGARED OMEGA-3 KRILL OIL 500 MG Caps Take 500 mg by mouth daily at 12 noon.   memantine 10 MG tablet Commonly known as:  NAMENDA Take 1 tablet by mouth daily.   metoprolol succinate 25 MG 24 hr tablet Commonly known as:  TOPROL-XL TAKE 1 TABLET BY MOUTH EVERY DAY   PRESERVISION AREDS 2+MULTI VIT Caps Take 2 capsules by mouth daily.   XARELTO 15 MG Tabs tablet Generic drug:  Rivaroxaban TAKE 1 TABLET(15 MG) BY MOUTH DAILY WITH DINNER  Discharge Medications: Please see discharge summary for a list of discharge medications.  Relevant Imaging Results:  Relevant Lab Results:   Additional Information SSN: [id number]  Carolin Sicks, Latanya Presser

## 2017-06-01 NOTE — Progress Notes (Signed)
Progress Note  Patient Name: Renee Cameron Date of Encounter: 06/01/2017  Primary Cardiologist: Kate Sable, MD   Subjective   Minimal incisional pain this morning.  Inpatient Medications    Scheduled Meds: . desvenlafaxine  50 mg Oral Daily  . donepezil  5 mg Oral QHS  . lisinopril  5 mg Oral Daily  . memantine  10 mg Oral Daily  . metoprolol succinate  25 mg Oral Daily   Continuous Infusions: . sodium chloride 100 mL/hr at 05/31/17 1220  .  ceFAZolin (ANCEF) IV Stopped (06/01/17 0522)   PRN Meds: acetaminophen, acetaminophen, ALPRAZolam, ondansetron (ZOFRAN) IV, ondansetron (ZOFRAN) IV, prochlorperazine   Vital Signs    Vitals:   05/31/17 2212 05/31/17 2243 06/01/17 0639 06/01/17 0641  BP: (!) 162/82 140/81  (!) 160/79  Pulse: 72 72  73  Resp: 18 (!) 22  (!) 26  Temp:    98.2 F (36.8 C)  TempSrc:    Oral  SpO2: 90% 95%  92%  Weight:   137 lb 3.2 oz (62.2 kg)   Height:        Intake/Output Summary (Last 24 hours) at 06/01/2017 0905 Last data filed at 05/31/2017 2117 Gross per 24 hour  Intake -  Output 250 ml  Net -250 ml   Filed Weights   05/29/17 1624 05/30/17 0429 06/01/17 0639  Weight: 134 lb (60.8 kg) 135 lb 8 oz (61.5 kg) 137 lb 3.2 oz (62.2 kg)    Telemetry    Ventricular pacng - Personally Reviewed  ECG    Sinus tachy vs atrial tachy with ventricular pacing - Personally Reviewed  Physical Exam   GEN: well appearing elderly woman, No acute distress.   Neck: No JVD Cardiac: RRR, no murmurs, rubs, or gallops.  Respiratory: Clear to auscultation bilaterally. Incision with no hematoma. Minimal ecchymosis. GI: Soft, nontender, non-distended  MS: No edema; No deformity. Neuro:  Nonfocal  Psych: Normal affect   Labs    Chemistry Recent Labs  Lab 05/29/17 1254 05/31/17 0441  NA 133* 134*  K 4.3 4.3  CL 97* 103  CO2 27 20*  GLUCOSE 145* 106*  BUN 19 21*  CREATININE 0.95 1.01*  CALCIUM 9.5 8.7*  PROT 7.2  --   ALBUMIN  4.3  --   AST 33  --   ALT 33  --   ALKPHOS 78  --   BILITOT 1.6*  --   GFRNONAA 51* 47*  GFRAA 59* 55*  ANIONGAP 9 11     Hematology Recent Labs  Lab 05/29/17 1254 05/31/17 0441  WBC 11.2* 9.9  RBC 4.40 4.10  HGB 13.4 12.3  HCT 41.1 38.9  MCV 93.4 94.9  MCH 30.5 30.0  MCHC 32.6 31.6  RDW 13.8 14.0  PLT 186 146*    Cardiac Enzymes Recent Labs  Lab 05/29/17 1254  TROPONINI 0.10*   No results for input(s): TROPIPOC in the last 168 hours.   BNPNo results for input(s): BNP, PROBNP in the last 168 hours.   DDimer No results for input(s): DDIMER in the last 168 hours.   Radiology    No results found.  Cardiac Studies   none  Patient Profile     84 y.o. female admitted with CHB, on toprol. No recurrent conduction after toprol held.   Assessment & Plan    1. CHB - she is well, s/p PPM.  2. PPM - her device appears to be working normally. Will plan to DC home with  usual followup. 3. HTN - she will continue her current meds. 4. Atrial fib - I would like her to hold her Xarelto starting it back on Tuesday evening.   For questions or updates, please contact [place] Please consult [email or web address] for contact info under Cardiology/STEMI.      Signed, Cristopher Peru, MD  06/01/2017, 9:05 AM  Patient ID: Renee Cameron, female   DOB: December 06, 1924, 84 y.o.   MRN: [id number]

## 2017-06-01 NOTE — Discharge Instructions (Signed)
Supplemental Discharge Instructions for  Pacemaker Patients  Activity No heavy lifting or vigorous activity with your left/right arm for 6 to 8 weeks.  Do not raise your left/right arm above your head for one week.  Gradually raise your affected arm as drawn below.           __        06/04/17                   06/05/17                         06/06/17                    06/07/17  NO DRIVING   WOUND CARE - Keep the wound area clean and dry.  Do not get this area wet for one week. No showers for one week; you may shower on   06/07/17  . - The tape/steri-strips on your wound will fall off; do not pull them off.  No bandage is needed on the site.  DO  NOT apply any creams, oils, or ointments to the wound area. - If you notice any drainage or discharge from the wound, any swelling or bruising at the site, or you develop a fever > 101? F after you are discharged home, call the office at once.  Special Instructions - You are still able to use cellular telephones; use the ear opposite the side where you have your pacemaker/defibrillator.  Avoid carrying your cellular phone near your device. - When traveling through airports, show security personnel your identification card to avoid being screened in the metal detectors.  Ask the security personnel to use the hand wand. - Avoid arc welding equipment, MRI testing (magnetic resonance imaging), TENS units (transcutaneous nerve stimulators).  Call the office for questions about other devices. - Avoid electrical appliances that are in poor condition or are not properly grounded. - Microwave ovens are safe to be near or to operate.     Heart-Healthy Eating Plan Heart-healthy meal planning includes:  Limiting unhealthy fats.  Increasing healthy fats.  Making other small dietary changes.  You may need to talk with your doctor or a diet specialist (dietitian) to create an eating plan that is right for you. What types of fat should I  choose?  Choose healthy fats. These include olive oil and canola oil, flaxseeds, walnuts, almonds, and seeds.  Eat more omega-3 fats. These include salmon, mackerel, sardines, tuna, flaxseed oil, and ground flaxseeds. Try to eat fish at least twice each week.  Limit saturated fats. ? Saturated fats are often found in animal products, such as meats, butter, and cream. ? Plant sources of saturated fats include palm oil, palm kernel oil, and coconut oil.  Avoid foods with partially hydrogenated oils in them. These include stick margarine, some tub margarines, cookies, crackers, and other baked goods. These contain trans fats. What general guidelines do I need to follow?  Check food labels carefully. Identify foods with trans fats or high amounts of saturated fat.  Fill one half of your plate with vegetables and green salads. Eat 4-5 servings of vegetables per day. A serving of vegetables is: ? 1 cup of raw leafy vegetables. ?  cup of raw or cooked cut-up vegetables. ?  cup of vegetable juice.  Fill one fourth of your plate with whole grains. Look for the word "whole" as the first word in  the ingredient list.  Fill one fourth of your plate with lean protein foods.  Eat 4-5 servings of fruit per day. A serving of fruit is: ? One medium whole fruit. ?  cup of dried fruit. ?  cup of fresh, frozen, or canned fruit. ?  cup of 100% fruit juice.  Eat more foods that contain soluble fiber. These include apples, broccoli, carrots, beans, peas, and barley. Try to get 20-30 g of fiber per day.  Eat more home-cooked food. Eat less restaurant, buffet, and fast food.  Limit or avoid alcohol.  Limit foods high in starch and sugar.  Avoid fried foods.  Avoid frying your food. Try baking, boiling, grilling, or broiling it instead. You can also reduce fat by: ? Removing the skin from poultry. ? Removing all visible fats from meats. ? Skimming the fat off of stews, soups, and gravies before  serving them. ? Steaming vegetables in water or broth.  Lose weight if you are overweight.  Eat 4-5 servings of nuts, legumes, and seeds per week: ? One serving of dried beans or legumes equals  cup after being cooked. ? One serving of nuts equals 1 ounces. ? One serving of seeds equals  ounce or one tablespoon.  You may need to keep track of how much salt or sodium you eat. This is especially true if you have high blood pressure. Talk with your doctor or dietitian to get more information. What foods can I eat? Grains Breads, including [place], white, pita, wheat, raisin, rye, oatmeal, and [place]. Tortillas that are neither fried nor made with lard or trans fat. Low-fat rolls, including hotdog and hamburger buns and English muffins. Biscuits. Muffins. Waffles. Pancakes. Light popcorn. Whole-grain cereals. Flatbread. Melba toast. Pretzels. Breadsticks. Rusks. Low-fat snacks. Low-fat crackers, including oyster, saltine, matzo, graham, animal, and rye. Rice and pasta, including brown rice and pastas that are made with whole wheat. Vegetables All vegetables. Fruits All fruits, but limit coconut. Meats and Other Protein Sources Lean, well-trimmed beef, veal, pork, and lamb. Chicken and [place] without skin. All fish and shellfish. Wild duck, rabbit, pheasant, and venison. Egg whites or low-cholesterol egg substitutes. Dried beans, peas, lentils, and tofu. Seeds and most nuts. Dairy Low-fat or nonfat cheeses, including ricotta, string, and mozzarella. Skim or 1% milk that is liquid, powdered, or evaporated. Buttermilk that is made with low-fat milk. Nonfat or low-fat yogurt. Beverages Mineral water. Diet carbonated beverages. Sweets and Desserts Sherbets and fruit ices. Honey, jam, marmalade, jelly, and syrups. Meringues and gelatins. Pure sugar candy, such as hard candy, jelly beans, gumdrops, mints, marshmallows, and small amounts of dark chocolate. [employer] Eat all sweets and  desserts in moderation. Fats and Oils Nonhydrogenated (trans-free) margarines. Vegetable oils, including soybean, sesame, sunflower, olive, peanut, safflower, corn, canola, and cottonseed. Salad dressings or mayonnaise made with a vegetable oil. Limit added fats and oils that you use for cooking, baking, salads, and as spreads. Other Cocoa powder. Coffee and tea. All seasonings and condiments. The items listed above may not be a complete list of recommended foods or beverages. Contact your dietitian for more options. What foods are not recommended? Grains Breads that are made with saturated or trans fats, oils, or whole milk. Croissants. Butter rolls. Cheese breads. Sweet rolls. Donuts. Buttered popcorn. Chow mein noodles. High-fat crackers, such as cheese or butter crackers. Meats and Other Protein Sources Fatty meats, such as hotdogs, short ribs, sausage, spareribs, bacon, rib eye roast or steak, and mutton. High-fat deli meats,  such as salami and bologna. Caviar. Domestic duck and goose. Organ meats, such as kidney, liver, sweetbreads, and heart. Dairy Cream, sour cream, cream cheese, and creamed cottage cheese. Whole-milk cheeses, including blue (bleu), Monte[place] Jack, [place], [place], American, [place], Swiss, cheddar, [place], and [place]. Whole or 2% milk that is liquid, evaporated, or condensed. Whole buttermilk. Cream sauce or high-fat cheese sauce. Yogurt that is made from whole milk. Beverages Regular sodas and juice drinks with added sugar. Sweets and Desserts Frosting. Pudding. Cookies. Cakes other than angel food cake. Candy that has milk chocolate or white chocolate, hydrogenated fat, butter, coconut, or unknown ingredients. Buttered syrups. Full-fat ice cream or ice cream drinks. Fats and Oils Gravy that has suet, meat fat, or shortening. Cocoa butter, hydrogenated oils, palm oil, coconut oil, palm kernel oil. These can often be found in baked products, candy, fried foods, nondairy  creamers, and whipped toppings. Solid fats and shortenings, including bacon fat, salt pork, lard, and butter. Nondairy cream substitutes, such as coffee creamers and sour cream substitutes. Salad dressings that are made of unknown oils, cheese, or sour cream. The items listed above may not be a complete list of foods and beverages to avoid. Contact your dietitian for more information. This information is not intended to replace advice given to you by your health care provider. Make sure you discuss any questions you have with your health care provider. Document Released: 06/26/2011 Document Revised: 06/02/2015 Document Reviewed: 06/18/2013 Elsevier Interactive Patient Education  [employer].

## 2017-06-04 ENCOUNTER — Encounter (HOSPITAL_COMMUNITY): Payer: Self-pay | Admitting: Internal Medicine

## 2017-06-06 ENCOUNTER — Encounter: Payer: Self-pay | Admitting: Cardiovascular Disease

## 2017-06-06 ENCOUNTER — Ambulatory Visit (HOSPITAL_COMMUNITY)
Admission: RE | Admit: 2017-06-06 | Discharge: 2017-06-06 | Disposition: A | Discharge status: Home / Self Care | Payer: Medicare Other | Source: Ambulatory Visit | Attending: Cardiovascular Disease | Admitting: Cardiovascular Disease

## 2017-06-06 ENCOUNTER — Ambulatory Visit (INDEPENDENT_AMBULATORY_CARE_PROVIDER_SITE_OTHER): Payer: Medicare Other | Admitting: Cardiovascular Disease

## 2017-06-06 ENCOUNTER — Other Ambulatory Visit: Payer: Self-pay | Admitting: Cardiovascular Disease

## 2017-06-06 ENCOUNTER — Telehealth: Payer: Self-pay

## 2017-06-06 VITALS — BP 128/64 | HR 73 | Ht 64.0 in | Wt 138.0 lb

## 2017-06-06 DIAGNOSIS — Z9289 Personal history of other medical treatment: Secondary | ICD-10-CM | POA: Diagnosis not present

## 2017-06-06 DIAGNOSIS — Z950 Presence of cardiac pacemaker: Secondary | ICD-10-CM

## 2017-06-06 DIAGNOSIS — R7989 Other specified abnormal findings of blood chemistry: Secondary | ICD-10-CM

## 2017-06-06 DIAGNOSIS — I313 Pericardial effusion (noninflammatory): Secondary | ICD-10-CM | POA: Insufficient documentation

## 2017-06-06 DIAGNOSIS — F339 Major depressive disorder, recurrent, unspecified: Secondary | ICD-10-CM | POA: Diagnosis not present

## 2017-06-06 DIAGNOSIS — G3184 Mild cognitive impairment, so stated: Secondary | ICD-10-CM | POA: Diagnosis not present

## 2017-06-06 DIAGNOSIS — I081 Rheumatic disorders of both mitral and tricuspid valves: Secondary | ICD-10-CM | POA: Diagnosis not present

## 2017-06-06 DIAGNOSIS — R7301 Impaired fasting glucose: Secondary | ICD-10-CM | POA: Diagnosis not present

## 2017-06-06 DIAGNOSIS — I10 Essential (primary) hypertension: Secondary | ICD-10-CM | POA: Diagnosis not present

## 2017-06-06 DIAGNOSIS — Z6822 Body mass index (BMI) 22.0-22.9, adult: Secondary | ICD-10-CM | POA: Diagnosis not present

## 2017-06-06 DIAGNOSIS — R946 Abnormal results of thyroid function studies: Secondary | ICD-10-CM | POA: Diagnosis not present

## 2017-06-06 DIAGNOSIS — R0602 Shortness of breath: Secondary | ICD-10-CM | POA: Insufficient documentation

## 2017-06-06 DIAGNOSIS — I4821 Permanent atrial fibrillation: Secondary | ICD-10-CM

## 2017-06-06 DIAGNOSIS — R600 Localized edema: Secondary | ICD-10-CM

## 2017-06-06 DIAGNOSIS — I4891 Unspecified atrial fibrillation: Secondary | ICD-10-CM | POA: Diagnosis not present

## 2017-06-06 DIAGNOSIS — J90 Pleural effusion, not elsewhere classified: Secondary | ICD-10-CM | POA: Insufficient documentation

## 2017-06-06 DIAGNOSIS — E875 Hyperkalemia: Secondary | ICD-10-CM | POA: Diagnosis not present

## 2017-06-06 DIAGNOSIS — I517 Cardiomegaly: Secondary | ICD-10-CM | POA: Insufficient documentation

## 2017-06-06 DIAGNOSIS — I482 Chronic atrial fibrillation: Secondary | ICD-10-CM

## 2017-06-06 DIAGNOSIS — I509 Heart failure, unspecified: Secondary | ICD-10-CM | POA: Diagnosis not present

## 2017-06-06 DIAGNOSIS — E782 Mixed hyperlipidemia: Secondary | ICD-10-CM | POA: Diagnosis not present

## 2017-06-06 DIAGNOSIS — Z79899 Other long term (current) drug therapy: Secondary | ICD-10-CM

## 2017-06-06 LAB — ECHOCARDIOGRAM LIMITED
Height: 64 in
WEIGHTICAEL: 2208 [oz_av]

## 2017-06-06 MED ORDER — FUROSEMIDE 40 MG PO TABS: ORAL_TABLET | ORAL | 0 refills | Status: DC

## 2017-06-06 MED ORDER — POTASSIUM CHLORIDE CRYS ER 20 MEQ PO TBCR: EXTENDED_RELEASE_TABLET | ORAL | 0 refills | Status: DC

## 2017-06-06 NOTE — Patient Instructions (Addendum)
Your physician recommends that you schedule a follow-up appointment in: as needed with Dr.Koneswaran   Get a chest x-ray today   Your physician has requested that you have an echocardiogram TODAY. Echocardiography is a painless test that uses sound waves to create images of your heart. It provides your doctor with information about the size and shape of your heart and how well your heart's chambers and valves are working. This procedure takes approximately one hour. There are no restrictions for this procedure.   Your physician recommends that you continue on your current medications as directed. Please refer to the Current Medication list given to you today.    If you need a refill on your cardiac medications before your next appointment, please call your pharmacy.    No lab work today.    Thank you for choosing [facility] !

## 2017-06-06 NOTE — Progress Notes (Signed)
*  PRELIMINARY RESULTS* Echocardiogram Limted STAT Echocardiogram has been performed.  Samuel Germany 06/06/2017, 10:36 AM

## 2017-06-06 NOTE — Telephone Encounter (Signed)
Sharyn Lull at [facility] informed, faxed rx and lab slip to her

## 2017-06-06 NOTE — Telephone Encounter (Signed)
-----   Message from Herminio Commons, MD sent at 06/06/2017  2:23 PM EDT ----- Evidence for CHF. Start Lasix 40 mg daily and KCl 20 meq daily x 5 days. Obtain BMET in 3 days.

## 2017-06-06 NOTE — Progress Notes (Signed)
SUBJECTIVE: The patient presents for posthospitalization follow-up.  She recently underwent pacemaker implantation for symptomatic complete heart block.  She has a history of permanent atrial fibrillation and hypertension as well.  She has developed bilateral feet swelling and has been short of breath since she was discharged last week and.  She does not feel that her shortness of breath has gotten worse over the past week but is uncertain if her bilateral feet swelling has progressed over the past week.  I reviewed her chest x-ray performed on 06/01/2017.  It showed small bilateral pleural effusions with suspected atelectasis medially in the right middle lobe and in the left lower lobe.  There was mild enlargement of the cardiopericardial silhouette without edema.  TSH was mildly elevated at 8.847.  She denies chest pain, orthopnea, and paroxysmal nocturnal dyspnea.  She has been walking with a walker for the past week which is unusual for her.  Review of Systems: As per "subjective", otherwise negative.  No Known Allergies  Current Outpatient Medications  Medication Sig Dispense Refill  . ALPRAZolam (XANAX) 0.5 MG tablet Take 0.5 mg by mouth 2 (two) times daily.     Marland Kitchen desvenlafaxine (PRISTIQ) 50 MG 24 hr tablet Take 1 tablet by mouth daily.    Marland Kitchen donepezil (ARICEPT) 5 MG tablet Take 5 mg by mouth at bedtime.  2  . lisinopril (PRINIVIL,ZESTRIL) 20 MG tablet TAKE 1 TABLET(20 MG) BY MOUTH DAILY 90 tablet 0  . MEGARED OMEGA-3 KRILL OIL 500 MG CAPS Take 500 mg by mouth daily at 12 noon.    . memantine (NAMENDA) 10 MG tablet Take 1 tablet by mouth daily.    . metoprolol succinate (TOPROL-XL) 25 MG 24 hr tablet TAKE 1 TABLET BY MOUTH EVERY DAY 90 tablet 3  . Multiple Vitamins-Minerals (PRESERVISION AREDS 2+MULTI VIT) CAPS Take 2 capsules by mouth daily.    Alveda Reasons 15 MG TABS tablet TAKE 1 TABLET(15 MG) BY MOUTH DAILY WITH DINNER 30 tablet 6   No current facility-administered  medications for this visit.     Past Medical History:  Diagnosis Date  . Chronic anticoagulation   . Complete heart block ([place]) 05/2017  . Complete heart block ([place])    a. s/p STJ PPM 05/2017.  Marland Kitchen Dementia   . Hypertension    unspecified  . Hyperthyroidism 2000   2000  . Osteoporosis   . Permanent atrial fibrillation ([place]) 1993   permanent; onset in 1993    Past Surgical History:  Procedure Laterality Date  . ABDOMINAL HYSTERECTOMY    . APPENDECTOMY    . COLONOSCOPY  2005   Dr. Laural Golden: few small diverticula. external hemorrhoids  . PACEMAKER IMPLANT N/A 05/31/2017   Procedure: PACEMAKER IMPLANT;  Surgeon: Evans Lance, MD;  Location: [facility];  Service: Cardiovascular;  Laterality: N/A;  . TOTAL ABDOMINAL HYSTERECTOMY W/ BILATERAL SALPINGOOPHORECTOMY      Social History   Socioeconomic History  . Marital status: Widowed    Spouse name: Not on file  . Number of children: Not on file  . Years of education: Not on file  . Highest education level: Not on file  Occupational History  . Occupation: Retired    Comment: [employer]  . Financial resource strain: Not on file  . Food insecurity:    Worry: Not on file    Inability: Not on file  . Transportation needs:    Medical: Not on file  Non-medical: Not on file  Tobacco Use  . Smoking status: Never Smoker  . Smokeless tobacco: Never Used  Substance and Sexual Activity  . Alcohol use: No    Alcohol/week: 0.0 oz  . Drug use: No  . Sexual activity: Not on file  Lifestyle  . Physical activity:    Days per week: Not on file    Minutes per session: Not on file  . Stress: Not on file  Relationships  . Social connections:    Talks on phone: Not on file    Gets together: Not on file    Attends religious service: Not on file    Active member of club or organization: Not on file    Attends meetings of clubs or organizations: Not on file    Relationship status: Not on file  .  Intimate partner violence:    Fear of current or ex partner: Not on file    Emotionally abused: Not on file    Physically abused: Not on file    Forced sexual activity: Not on file  Other Topics Concern  . Not on file  Social History Narrative   Volunteers 2 days weekly     There were no vitals filed for this visit.  Wt Readings from Last 3 Encounters:  06/01/17 137 lb 3.2 oz (62.2 kg)  04/26/16 125 lb (56.7 kg)  03/27/16 122 lb (55.3 kg)     PHYSICAL EXAM General: NAD HEENT: Normal. Neck: No JVD, no thyromegaly. Lungs: Mildly diminished breath sounds in right lower lung field, no crackles or wheezes. CV: Regular rate and rhythm, normal S1/S2, no S3/S4, no murmur.  Bilateral peri-ankle and dorsal pedal edema.   Abdomen: Soft, nontender, no distention.  Neurologic: Alert and oriented.  Psych: Normal affect. Skin: Normal. Musculoskeletal: No gross deformities.    ECG: Most recent ECG reviewed.   Labs: Lab Results  Component Value Date/Time   K 4.3 05/31/2017 04:41 AM   BUN 21 (H) 05/31/2017 04:41 AM   CREATININE 1.01 (H) 05/31/2017 04:41 AM   CREATININE 0.91 06/28/2010 08:00 AM   ALT 33 05/29/2017 12:54 PM   TSH 8.847 (H) 05/29/2017 05:26 PM   TSH 4.2 and 06/08/2009   HGB 12.3 05/31/2017 04:41 AM     Lipids: Lab Results  Component Value Date/Time   LDLCALC 113 (H) 08/14/2010 07:10 AM   CHOL 192 08/14/2010 07:10 AM   TRIG 165 (H) 08/14/2010 07:10 AM   HDL 46 08/14/2010 07:10 AM       ASSESSMENT AND PLAN: 1.  Symptomatic complete heart block status post pacemaker implantation with bilateral ankle and dorsal pedal edema and shortness of breath: She has developed bilateral ankle and feet swelling and shortness of breath.  I will obtain a chest x-ray to assess for enlargement of pleural effusions and a limited echocardiogram to evaluate for a pericardial effusion today.  She is scheduled for wound and pacemaker check on June 6.  I will hold off on diuretics  until diagnostic testing is performed today.  2.  Permanent atrial fibrillation: Symptomatically stable.  She is on metoprolol and Xarelto 15 mg daily.  3.  Hypertension: Blood pressure is normal.  No changes to therapy.  4.  Elevated TSH: It is only mildly elevated.  Further management will be deferred to her PCP.   Disposition: Follow up with me as needed.  Follow-up with Dr. Lovena Le as scheduled.  She has a wound and pacemaker check on June 6.   Jamesetta So  Bronson Ing, M.D., F.A.C.C.

## 2017-06-10 ENCOUNTER — Other Ambulatory Visit (HOSPITAL_COMMUNITY)
Admission: RE | Admit: 2017-06-10 | Discharge: 2017-06-10 | Disposition: A | Discharge status: Home / Self Care | Payer: Medicare Other | Source: Ambulatory Visit | Attending: Cardiovascular Disease | Admitting: Cardiovascular Disease

## 2017-06-10 DIAGNOSIS — Z79899 Other long term (current) drug therapy: Secondary | ICD-10-CM | POA: Diagnosis not present

## 2017-06-10 LAB — BASIC METABOLIC PANEL
Anion gap: 10 (ref 5–15)
BUN: 12 mg/dL (ref 6–20)
CO2: 30 mmol/L (ref 22–32)
Calcium: 9.2 mg/dL (ref 8.9–10.3)
Chloride: 95 mmol/L — ABNORMAL LOW (ref 101–111)
Creatinine, Ser: 0.99 mg/dL (ref 0.44–1.00)
GFR calc Af Amer: 56 mL/min — ABNORMAL LOW (ref >60)
GFR, EST NON AFRICAN AMERICAN: 48 mL/min — AB (ref >60)
GLUCOSE: 125 mg/dL — AB (ref 65–99)
POTASSIUM: 3.9 mmol/L (ref 3.5–5.1)
Sodium: 135 mmol/L (ref 135–145)

## 2017-06-13 ENCOUNTER — Ambulatory Visit (INDEPENDENT_AMBULATORY_CARE_PROVIDER_SITE_OTHER): Payer: Medicare Other | Admitting: *Deleted

## 2017-06-13 VITALS — BP 122/78 | HR 70

## 2017-06-13 DIAGNOSIS — I4821 Permanent atrial fibrillation: Secondary | ICD-10-CM

## 2017-06-13 DIAGNOSIS — Z950 Presence of cardiac pacemaker: Secondary | ICD-10-CM

## 2017-06-13 DIAGNOSIS — I442 Atrioventricular block, complete: Secondary | ICD-10-CM

## 2017-06-13 DIAGNOSIS — I482 Chronic atrial fibrillation: Secondary | ICD-10-CM

## 2017-06-13 LAB — CUP PACEART INCLINIC DEVICE CHECK
Battery Remaining Longevity: 78 mo
Date Time Interrogation Session: 20190606160722
Implantable Lead Implant Date: 20190524
Implantable Lead Implant Date: 20190524
Implantable Lead Location: 753859
Implantable Pulse Generator Implant Date: 20190524
Lead Channel Impedance Value: 487.5 Ohm
Lead Channel Pacing Threshold Amplitude: 0.5 V
Lead Channel Sensing Intrinsic Amplitude: 12 mV
Lead Channel Sensing Intrinsic Amplitude: 4.8 mV
Lead Channel Setting Pacing Amplitude: 3.5 V
Lead Channel Setting Sensing Sensitivity: 4 mV
MDC IDC LEAD LOCATION: 753860
MDC IDC MSMT BATTERY VOLTAGE: 3.05 V
MDC IDC MSMT LEADCHNL RV IMPEDANCE VALUE: 650 Ohm
MDC IDC MSMT LEADCHNL RV PACING THRESHOLD PULSEWIDTH: 0.5 ms
MDC IDC PG SERIAL: 9022503
MDC IDC SET LEADCHNL RV PACING AMPLITUDE: 3.5 V
MDC IDC SET LEADCHNL RV PACING PULSEWIDTH: 0.5 ms
MDC IDC STAT BRADY RA PERCENT PACED: 18 %
MDC IDC STAT BRADY RV PERCENT PACED: 99.96 %
Pulse Gen Model: 2272

## 2017-06-13 NOTE — Progress Notes (Signed)
Wound check appointment. Steri-strips removed. Wound without redness or edema. Incision edges approximated, wound well healed. Normal device function. Thresholds, sensing, and impedances consistent with implant measurements. Device programmed at 3.5V for extra safety margin until 3 month visit. Histogram distribution appropriate for patient and level of activity. 91% AT/AF burden, +Xarelto; mode maintained DDIR per GT, plan to reassess rhythm at next f/u. No high ventricular rates noted. Patient educated about wound care, arm mobility, lifting restrictions, and Merlin monitor. ROV with GT/R on 09/19/17.  Patient reports ongoing ShOB since hospital d/c. BLE edema improved with furosemide (started by Dr. Bronson Ing). No additional med changes per Dr. Lovena Le. Dr. Lovena Le reviewed CXR and assessed patient, advised ShOB is not likely cardiac-related. He encouraged patient to start ambulating more. Patient and daughter verbalize understanding.

## 2017-06-27 DIAGNOSIS — I482 Chronic atrial fibrillation: Secondary | ICD-10-CM | POA: Diagnosis not present

## 2017-06-27 DIAGNOSIS — I10 Essential (primary) hypertension: Secondary | ICD-10-CM | POA: Diagnosis not present

## 2017-06-27 DIAGNOSIS — R946 Abnormal results of thyroid function studies: Secondary | ICD-10-CM | POA: Diagnosis not present

## 2017-06-27 DIAGNOSIS — F418 Other specified anxiety disorders: Secondary | ICD-10-CM | POA: Diagnosis not present

## 2017-06-27 DIAGNOSIS — E782 Mixed hyperlipidemia: Secondary | ICD-10-CM | POA: Diagnosis not present

## 2017-06-27 DIAGNOSIS — H6122 Impacted cerumen, left ear: Secondary | ICD-10-CM | POA: Diagnosis not present

## 2017-06-27 DIAGNOSIS — I509 Heart failure, unspecified: Secondary | ICD-10-CM | POA: Diagnosis not present

## 2017-06-27 DIAGNOSIS — G3184 Mild cognitive impairment, so stated: Secondary | ICD-10-CM | POA: Diagnosis not present

## 2017-06-27 DIAGNOSIS — Z6821 Body mass index (BMI) 21.0-21.9, adult: Secondary | ICD-10-CM | POA: Diagnosis not present

## 2017-06-27 DIAGNOSIS — F39 Unspecified mood [affective] disorder: Secondary | ICD-10-CM | POA: Diagnosis not present

## 2017-06-27 DIAGNOSIS — F339 Major depressive disorder, recurrent, unspecified: Secondary | ICD-10-CM | POA: Diagnosis not present

## 2017-06-27 DIAGNOSIS — E875 Hyperkalemia: Secondary | ICD-10-CM | POA: Diagnosis not present

## 2017-08-12 DIAGNOSIS — R946 Abnormal results of thyroid function studies: Secondary | ICD-10-CM | POA: Diagnosis not present

## 2017-08-12 DIAGNOSIS — E875 Hyperkalemia: Secondary | ICD-10-CM | POA: Diagnosis not present

## 2017-08-12 DIAGNOSIS — F418 Other specified anxiety disorders: Secondary | ICD-10-CM | POA: Diagnosis not present

## 2017-08-12 DIAGNOSIS — Z950 Presence of cardiac pacemaker: Secondary | ICD-10-CM | POA: Diagnosis not present

## 2017-08-12 DIAGNOSIS — H6122 Impacted cerumen, left ear: Secondary | ICD-10-CM | POA: Diagnosis not present

## 2017-08-12 DIAGNOSIS — E782 Mixed hyperlipidemia: Secondary | ICD-10-CM | POA: Diagnosis not present

## 2017-08-12 DIAGNOSIS — F339 Major depressive disorder, recurrent, unspecified: Secondary | ICD-10-CM | POA: Diagnosis not present

## 2017-08-12 DIAGNOSIS — I482 Chronic atrial fibrillation: Secondary | ICD-10-CM | POA: Diagnosis not present

## 2017-08-12 DIAGNOSIS — I10 Essential (primary) hypertension: Secondary | ICD-10-CM | POA: Diagnosis not present

## 2017-08-12 DIAGNOSIS — I509 Heart failure, unspecified: Secondary | ICD-10-CM | POA: Diagnosis not present

## 2017-08-12 DIAGNOSIS — R7301 Impaired fasting glucose: Secondary | ICD-10-CM | POA: Diagnosis not present

## 2017-08-12 DIAGNOSIS — F39 Unspecified mood [affective] disorder: Secondary | ICD-10-CM | POA: Diagnosis not present

## 2017-08-12 DIAGNOSIS — Z6820 Body mass index (BMI) 20.0-20.9, adult: Secondary | ICD-10-CM | POA: Diagnosis not present

## 2017-08-12 DIAGNOSIS — R600 Localized edema: Secondary | ICD-10-CM | POA: Diagnosis not present

## 2017-08-12 DIAGNOSIS — G3184 Mild cognitive impairment, so stated: Secondary | ICD-10-CM | POA: Diagnosis not present

## 2017-08-12 DIAGNOSIS — Z6821 Body mass index (BMI) 21.0-21.9, adult: Secondary | ICD-10-CM | POA: Diagnosis not present

## 2017-08-12 DIAGNOSIS — S4992XA Unspecified injury of left shoulder and upper arm, initial encounter: Secondary | ICD-10-CM | POA: Diagnosis not present

## 2017-09-12 ENCOUNTER — Encounter: Payer: Medicare Other | Admitting: Internal Medicine

## 2017-09-19 ENCOUNTER — Encounter: Payer: Self-pay | Admitting: Internal Medicine

## 2017-09-19 ENCOUNTER — Ambulatory Visit (INDEPENDENT_AMBULATORY_CARE_PROVIDER_SITE_OTHER): Payer: Medicare Other | Admitting: Internal Medicine

## 2017-09-19 VITALS — BP 122/64 | HR 69 | Ht 64.0 in | Wt 140.0 lb

## 2017-09-19 DIAGNOSIS — I442 Atrioventricular block, complete: Secondary | ICD-10-CM

## 2017-09-19 NOTE — Patient Instructions (Signed)
Medication Instructions:  Your physician recommends that you continue on your current medications as directed. Please refer to the Current Medication list given to you today.   Labwork: NONE  Testing/Procedures: NONE  Follow-Up: Your physician recommends that you schedule a follow-up appointment in: MAY 2020   Any Other Special Instructions Will Be Listed Below (If Applicable).     If you need a refill on your cardiac medications before your next appointment, please call your pharmacy.

## 2017-09-19 NOTE — Progress Notes (Signed)
HPI Renee Cameron returns today for followup of her PPM implanted for CHB approx 3 months ago. In the interim, she has done well with no chest pain, sob, or syncope.  No Known Allergies   Current Outpatient Medications  Medication Sig Dispense Refill  . ALPRAZolam (XANAX) 0.5 MG tablet Take 0.5 mg by mouth 2 (two) times daily.     Marland Kitchen desvenlafaxine (PRISTIQ) 50 MG 24 hr tablet Take 1 tablet by mouth daily.    Marland Kitchen donepezil (ARICEPT) 5 MG tablet Take 5 mg by mouth at bedtime.  2  . furosemide (LASIX) 20 MG tablet Take 20 mg by mouth daily.    Marland Kitchen lisinopril (PRINIVIL,ZESTRIL) 20 MG tablet TAKE 1 TABLET(20 MG) BY MOUTH DAILY 90 tablet 0  . MEGARED OMEGA-3 KRILL OIL 500 MG CAPS Take 500 mg by mouth daily at 12 noon.    . memantine (NAMENDA) 10 MG tablet Take 1 tablet by mouth daily.    . metoprolol succinate (TOPROL-XL) 25 MG 24 hr tablet TAKE 1 TABLET BY MOUTH EVERY DAY 90 tablet 3  . Multiple Vitamins-Minerals (PRESERVISION AREDS 2+MULTI VIT) CAPS Take 2 capsules by mouth daily.    . potassium chloride SA (KLOR-CON M20) 20 MEQ tablet Take 20 meq daily for 5 days 5 tablet 0  . XARELTO 15 MG TABS tablet TAKE 1 TABLET(15 MG) BY MOUTH DAILY WITH DINNER 30 tablet 6   No current facility-administered medications for this visit.      Past Medical History:  Diagnosis Date  . Chronic anticoagulation   . Complete heart block ([place]) 05/2017  . Complete heart block ([place])    a. s/p STJ PPM 05/2017.  Marland Kitchen Dementia   . Hypertension    unspecified  . Hyperthyroidism 2000   2000  . Osteoporosis   . Permanent atrial fibrillation ([place]) 1993   permanent; onset in 1993    ROS:   All systems reviewed and negative except as noted in the HPI.   Past Surgical History:  Procedure Laterality Date  . ABDOMINAL HYSTERECTOMY    . APPENDECTOMY    . COLONOSCOPY  2005   Dr. Laural Golden: few small diverticula. external hemorrhoids  . PACEMAKER IMPLANT N/A 05/31/2017   Procedure: PACEMAKER IMPLANT;  Surgeon:  Evans Lance, MD;  Location: [facility];  Service: Cardiovascular;  Laterality: N/A;  . TOTAL ABDOMINAL HYSTERECTOMY W/ BILATERAL SALPINGOOPHORECTOMY       Family History  Problem Relation Age of Onset  . Prostate cancer Father   . Stroke Father   . Heart disease Mother   . Coronary artery disease Sister        with prior CABG surgery     Social History   Socioeconomic History  . Marital status: Widowed    Spouse name: Not on file  . Number of children: Not on file  . Years of education: Not on file  . Highest education level: Not on file  Occupational History  . Occupation: Retired    Comment: [employer]  . Financial resource strain: Not on file  . Food insecurity:    Worry: Not on file    Inability: Not on file  . Transportation needs:    Medical: Not on file    Non-medical: Not on file  Tobacco Use  . Smoking status: Never Smoker  . Smokeless tobacco: Never Used  Substance and Sexual Activity  . Alcohol use: No    Alcohol/week: 0.0  standard drinks  . Drug use: No  . Sexual activity: Not on file  Lifestyle  . Physical activity:    Days per week: Not on file    Minutes per session: Not on file  . Stress: Not on file  Relationships  . Social connections:    Talks on phone: Not on file    Gets together: Not on file    Attends religious service: Not on file    Active member of club or organization: Not on file    Attends meetings of clubs or organizations: Not on file    Relationship status: Not on file  . Intimate partner violence:    Fear of current or ex partner: Not on file    Emotionally abused: Not on file    Physically abused: Not on file    Forced sexual activity: Not on file  Other Topics Concern  . Not on file  Social History Narrative   Volunteers 2 days weekly     BP 122/64 (BP Location: Right Arm)   Pulse 69   Ht 5\' 4"  (1.626 m)   Wt 140 lb (63.5 kg)   SpO2 94%   BMI 24.03 kg/m   Physical  Exam:  Well appearing NAD HEENT: Unremarkable Neck:  No JVD, no thyromegally Lymphatics:  No adenopathy Back:  No CVA tenderness Lungs:  Clear with no wheezes HEART:  Regular rate rhythm, no murmurs, no rubs, no clicks Abd:  soft, positive bowel sounds, no organomegally, no rebound, no guarding Ext:  2 plus pulses, no edema, no cyanosis, no clubbing Skin:  No rashes no nodules Neuro:  CN II through XII intact, motor grossly intact   DEVICE  Normal device function.  See PaceArt for details.   Assess/Plan: 1. CHB - she is s/p PPM and is asymptomatic. 2. Atrial fib - she is mostly in NSR. She will continue her current meds. I consider uptitration of her beta blocker but because her blood pressure is good, will hold off. 3. HTN - her blood pressure is well controlled. No change in meds.  Mikle Bosworth.D.

## 2017-09-28 ENCOUNTER — Encounter (HOSPITAL_COMMUNITY): Payer: Self-pay | Admitting: Emergency Medicine

## 2017-09-28 ENCOUNTER — Emergency Department (HOSPITAL_COMMUNITY): Payer: Medicare Other

## 2017-09-28 ENCOUNTER — Other Ambulatory Visit: Payer: Self-pay

## 2017-09-28 ENCOUNTER — Emergency Department (HOSPITAL_COMMUNITY)
Admission: EM | Admit: 2017-09-28 | Discharge: 2017-09-28 | Disposition: A | Discharge status: Home / Self Care | Payer: Medicare Other | Attending: Emergency Medicine | Admitting: Emergency Medicine

## 2017-09-28 DIAGNOSIS — I482 Chronic atrial fibrillation: Secondary | ICD-10-CM | POA: Insufficient documentation

## 2017-09-28 DIAGNOSIS — R0989 Other specified symptoms and signs involving the circulatory and respiratory systems: Secondary | ICD-10-CM | POA: Diagnosis not present

## 2017-09-28 DIAGNOSIS — Z950 Presence of cardiac pacemaker: Secondary | ICD-10-CM | POA: Diagnosis not present

## 2017-09-28 DIAGNOSIS — Z79899 Other long term (current) drug therapy: Secondary | ICD-10-CM | POA: Diagnosis not present

## 2017-09-28 DIAGNOSIS — I10 Essential (primary) hypertension: Secondary | ICD-10-CM | POA: Insufficient documentation

## 2017-09-28 DIAGNOSIS — Z7901 Long term (current) use of anticoagulants: Secondary | ICD-10-CM | POA: Diagnosis not present

## 2017-09-28 DIAGNOSIS — R600 Localized edema: Secondary | ICD-10-CM | POA: Diagnosis not present

## 2017-09-28 DIAGNOSIS — I4510 Unspecified right bundle-branch block: Secondary | ICD-10-CM | POA: Diagnosis not present

## 2017-09-28 DIAGNOSIS — M7989 Other specified soft tissue disorders: Secondary | ICD-10-CM | POA: Diagnosis not present

## 2017-09-28 DIAGNOSIS — F0390 Unspecified dementia without behavioral disturbance: Secondary | ICD-10-CM | POA: Diagnosis not present

## 2017-09-28 DIAGNOSIS — R222 Localized swelling, mass and lump, trunk: Secondary | ICD-10-CM | POA: Diagnosis not present

## 2017-09-28 LAB — CBC WITH DIFFERENTIAL/PLATELET
Basophils Absolute: 0 10*3/uL (ref 0.0–0.1)
Basophils Relative: 1 %
EOS ABS: 0.1 10*3/uL (ref 0.0–0.7)
EOS PCT: 1 %
HCT: 42.3 % (ref 36.0–46.0)
HEMOGLOBIN: 13.8 g/dL (ref 12.0–15.0)
LYMPHS ABS: 1.6 10*3/uL (ref 0.7–4.0)
Lymphocytes Relative: 26 %
MCH: 30.1 pg (ref 26.0–34.0)
MCHC: 32.6 g/dL (ref 30.0–36.0)
MCV: 92.2 fL (ref 78.0–100.0)
MONO ABS: 0.6 10*3/uL (ref 0.1–1.0)
MONOS PCT: 9 %
NEUTROS PCT: 63 %
Neutro Abs: 3.9 10*3/uL (ref 1.7–7.7)
Platelets: 177 10*3/uL (ref 150–400)
RBC: 4.59 MIL/uL (ref 3.87–5.11)
RDW: 14.4 % (ref 11.5–15.5)
WBC: 6.1 10*3/uL (ref 4.0–10.5)

## 2017-09-28 LAB — TYPE AND SCREEN
ABO/RH(D): O POS
ANTIBODY SCREEN: NEGATIVE

## 2017-09-28 LAB — COMPREHENSIVE METABOLIC PANEL
ALK PHOS: 97 U/L (ref 38–126)
ALT: 11 U/L (ref 0–44)
ANION GAP: 9 (ref 5–15)
AST: 21 U/L (ref 15–41)
Albumin: 4.2 g/dL (ref 3.5–5.0)
BUN: 11 mg/dL (ref 8–23)
CALCIUM: 9.3 mg/dL (ref 8.9–10.3)
CO2: 27 mmol/L (ref 22–32)
Chloride: 99 mmol/L (ref 98–111)
Creatinine, Ser: 0.89 mg/dL (ref 0.44–1.00)
GFR calc non Af Amer: 55 mL/min — ABNORMAL LOW (ref >60)
Glucose, Bld: 80 mg/dL (ref 70–99)
Potassium: 4 mmol/L (ref 3.5–5.1)
SODIUM: 135 mmol/L (ref 135–145)
TOTAL PROTEIN: 7.3 g/dL (ref 6.5–8.1)
Total Bilirubin: 1 mg/dL (ref 0.3–1.2)

## 2017-09-28 LAB — PROTIME-INR
INR: 1.2
Prothrombin Time: 15.1 seconds (ref 11.4–15.2)

## 2017-09-28 MED ORDER — IOPAMIDOL (ISOVUE-300) INJECTION 61%: 75.0000 mL | Freq: Once | INTRAVENOUS | Status: DC | PRN

## 2017-09-28 MED ORDER — IOHEXOL 300 MG/ML  SOLN
75.0000 mL | Freq: Once | INTRAMUSCULAR | Status: AC | PRN
  Administered 2017-09-28: 75 mL via INTRAVENOUS

## 2017-09-28 NOTE — ED Provider Notes (Signed)
[facility] EMERGENCY DEPARTMENT Provider Note   CSN: [id number] Arrival date & time: 09/28/17  9892     History   Chief Complaint Chief Complaint  Patient presents with  . Hand Injury    HPI RUE VALLADARES is a 84 y.o. female.  HPI  84 year old female, she has a known history of complete heart block status post pacemaker placement in May 2019, she also has a history of permanent atrial fibrillation, hypertension and dementia.  She is accompanied by her daughter who is the primary historian.  The patient currently lives at [facility] assisted living facility and was in her usual state of health until last night when she started to notice her hand swelling.  Today she noticed that her left upper extremity was almost completely swollen from the fingertips to the shoulder.  She states she does not have pain but has a feeling of tightness in the arm.  She has no chest pain shortness of breath fevers or chills, she has no swelling of her lower extremities.  Level 5 caveat applies secondary to dementia  Past Medical History:  Diagnosis Date  . Chronic anticoagulation   . Complete heart block ([place]) 05/2017  . Complete heart block ([place])    a. s/p STJ PPM 05/2017.  Marland Kitchen Dementia   . Hypertension    unspecified  . Hyperthyroidism 2000   2000  . Osteoporosis   . Permanent atrial fibrillation ([place]) 1993   permanent; onset in 1993    Patient Active Problem List   Diagnosis Date Noted  . Abnormal thyroid function test 06/01/2017  . Complete heart block ([place]) 05/29/2017  . Elevated troponin 04/26/2016  . Elevated lipase 04/26/2016  . Dementia 04/26/2016  . Abdominal pain in female   . Diarrhea   . Rectus sheath hematoma   . Acute blood loss anemia 03/14/2014  . Bruise, trunk 03/14/2014  . Vomiting 03/11/2014  . CAP (community acquired pneumonia) 03/11/2014  . Hyponatremia 03/11/2014  . Nausea   . Encounter for therapeutic drug monitoring 02/05/2013  . Permanent atrial  fibrillation ([place])   . Hyperthyroidism   . Hypertension   . Chronic anticoagulation     Past Surgical History:  Procedure Laterality Date  . ABDOMINAL HYSTERECTOMY    . APPENDECTOMY    . COLONOSCOPY  2005   Dr. Laural Golden: few small diverticula. external hemorrhoids  . PACEMAKER IMPLANT N/A 05/31/2017   Procedure: PACEMAKER IMPLANT;  Surgeon: Evans Lance, MD;  Location: [facility];  Service: Cardiovascular;  Laterality: N/A;  . TOTAL ABDOMINAL HYSTERECTOMY W/ BILATERAL SALPINGOOPHORECTOMY       OB History   None      Home Medications    Prior to Admission medications   Medication Sig Start Date End Date Taking? Authorizing Provider  ALPRAZolam Duanne Moron) 0.5 MG tablet Take 0.5 mg by mouth 2 (two) times daily.    Yes [provider]  desvenlafaxine (PRISTIQ) 50 MG 24 hr tablet Take 1 tablet by mouth daily. 05/18/17  Yes [provider]  donepezil (ARICEPT) 5 MG tablet Take 5 mg by mouth at bedtime. 04/09/17  Yes [provider]  furosemide (LASIX) 20 MG tablet Take 20 mg by mouth daily.   Yes [provider]  lisinopril (PRINIVIL,ZESTRIL) 20 MG tablet TAKE 1 TABLET(20 MG) BY MOUTH DAILY 11/06/16  Yes Herminio Commons, MD  MEGARED OMEGA-3 KRILL OIL 500 MG CAPS Take 500 mg by mouth daily at 12 noon.   Yes [provider]  memantine (NAMENDA) 10 MG tablet Take 1 tablet by mouth daily. 04/20/17  Yes [provider]  metoprolol succinate (TOPROL-XL) 25 MG 24 hr tablet TAKE 1 TABLET BY MOUTH EVERY DAY 05/13/15  Yes Herminio Commons, MD  Multiple Vitamins-Minerals (PRESERVISION AREDS 2+MULTI VIT) CAPS Take 2 capsules by mouth daily.   Yes [provider]  potassium chloride (K-DUR,KLOR-CON) 10 MEQ tablet Take 1 tablet by mouth daily. 09/07/17  Yes [provider]  XARELTO 15 MG TABS tablet TAKE 1 TABLET(15 MG) BY MOUTH DAILY WITH DINNER 10/30/16  Yes Herminio Commons, MD    Family History Family History    Problem Relation Age of Onset  . Prostate cancer Father   . Stroke Father   . Heart disease Mother   . Coronary artery disease Sister        with prior CABG surgery    Social History Social History   Tobacco Use  . Smoking status: Never Smoker  . Smokeless tobacco: Never Used  Substance Use Topics  . Alcohol use: No    Alcohol/week: 0.0 standard drinks  . Drug use: No     Allergies   Patient has no known allergies.   Review of Systems Review of Systems  All other systems reviewed and are negative.    Physical Exam Updated Vital Signs BP (!) 149/79   Pulse 61   Temp 97.6 F (36.4 C) (Oral)   Resp 19   Ht 1.626 m (5\' 4" )   Wt 66.2 kg   SpO2 94%   BMI 25.06 kg/m   Physical Exam  Constitutional: She appears well-developed and well-nourished. No distress.  HENT:  Head: Normocephalic and atraumatic.  Mouth/Throat: Oropharynx is clear and moist. No oropharyngeal exudate.  Eyes: Pupils are equal, round, and reactive to light. Conjunctivae and EOM are normal. Right eye exhibits no discharge. Left eye exhibits no discharge. No scleral icterus.  Neck: Normal range of motion. Neck supple. No JVD present. No thyromegaly present.  Cardiovascular: Normal rate, regular rhythm, normal heart sounds and intact distal pulses. Exam reveals no gallop and no friction rub.  No murmur heard. Pulmonary/Chest: Effort normal and breath sounds normal. No respiratory distress. She has no wheezes. She has no rales. She exhibits no tenderness ( pacer palpable in the LUE - non tender, no swelling, edema or redness).  Abdominal: Soft. Bowel sounds are normal. She exhibits no distension and no mass. There is no tenderness.  Musculoskeletal: Normal range of motion. She exhibits edema. She exhibits no tenderness.  Has some asymettrical swelling of the LUE - normal pulses at the radial arteries bilaterally, cool to the touch of the LUE - no edema of the legs.  Lymphadenopathy:    She has no  cervical adenopathy.  Neurological: She is alert. Coordination normal.  Skin: Skin is warm and dry. No rash noted. No erythema.  Psychiatric: She has a normal mood and affect. Her behavior is normal.  Nursing note and vitals reviewed.    ED Treatments / Results  Labs (all labs ordered are listed, but only abnormal results are displayed) Labs Reviewed  COMPREHENSIVE METABOLIC PANEL - Abnormal; Notable for the following components:      Result Value   GFR calc non Af Amer 55 (*)    All other components within normal limits  CBC WITH DIFFERENTIAL/PLATELET  PROTIME-INR  TYPE AND SCREEN    EKG EKG Interpretation  Date/Time:  Saturday September 28 2017 10:36:59 EDT Ventricular Rate:  73 PR Interval:    QRS Duration: 155 QT Interval:  454 QTC Calculation: 501 R Axis:   150 Text Interpretation:  VENTRICULAR PACED RHYTHM Right bundle branch block Anterolateral infarct, age indeterminate pacer spikes seen no changes from prior. Confirmed by Noemi Chapel [phone]) on 09/28/2017 11:00:59 AM   Radiology US Venous Img Upper Uni Left  Result Date: 09/28/2017 CLINICAL DATA:  Left arm and hand swelling. Recent pacemaker insertion noted. EXAM: LEFT UPPER EXTREMITY VENOUS DOPPLER ULTRASOUND TECHNIQUE: Gray-scale sonography with graded compression, as well as color Doppler and duplex ultrasound were performed to evaluate the upper extremity deep venous system from the level of the subclavian vein and including the jugular, axillary, basilic, radial, ulnar and upper cephalic vein. Spectral Doppler was utilized to evaluate flow at rest and with distal augmentation maneuvers. COMPARISON:  None. FINDINGS: Contralateral internal jugular vein: Respiratory phasicity is normal and symmetric with the symptomatic side. No evidence of thrombus. Normal compressibility. Internal Jugular Vein: No evidence of thrombus. Normal compressibility, respiratory phasicity and response to augmentation. Subclavian Vein: No  evidence of thrombus. Normal compressibility, respiratory phasicity and response to augmentation. Axillary Vein: No evidence of thrombus. Normal compressibility, respiratory phasicity and response to augmentation. Cephalic Vein: No evidence of thrombus. Normal compressibility, respiratory phasicity and response to augmentation. Basilic Vein: No evidence of thrombus. Normal compressibility, respiratory phasicity and response to augmentation. Brachial Veins: No evidence of thrombus. Normal compressibility, respiratory phasicity and response to augmentation. Radial Veins: No evidence of thrombus. Normal compressibility, respiratory phasicity and response to augmentation. Ulnar Veins: No evidence of thrombus. Normal compressibility, respiratory phasicity and response to augmentation. Venous Reflux:  None visualized. Other Findings:  Left upper extremity subcutaneous edema noted. IMPRESSION: No evidence of DVT within the left upper extremity. Electronically Signed   By: Jerilynn Mages.  Shick M.D.   On: 09/28/2017 11:43   Dg Chest Port 1 View  Result Date: 09/28/2017 CLINICAL DATA:  Pacemaker complications, swelling left arm, pacemaker placed in May 2019 EXAM: PORTABLE CHEST 1 VIEW COMPARISON:  06/06/2017 FINDINGS: Moderate enlargement of the cardiopericardial silhouette, stable from the prior study. No mediastinal or hilar masses. Left anterior chest wall sequential pacemaker is stable, leads projecting over the right atrium and right ventricle. Lungs are hyperexpanded. There are prominent bronchovascular markings bilaterally. Additional opacity noted lung bases, left greater than right, likely atelectasis. No pulmonary edema. No convincing pleural effusion and no pneumothorax. Skeletal structures are demineralized but grossly intact. IMPRESSION: 1. No acute cardiopulmonary disease. 2. Sequential pacemaker is stable, leads projecting over the right atrium right ventricle. 3. Stable cardiomegaly. Electronically Signed   By: Lajean Manes M.D.   On: 09/28/2017 10:36    Procedures Procedures (including critical care time)  Medications Ordered in ED Medications - No data to display   Initial Impression / Assessment and Plan / ED Course  I have reviewed the triage vital signs and the nursing notes.  Pertinent labs & imaging results that were available during my care of the patient were reviewed by me and considered in my medical decision making (see chart for details).  Clinical Course as of Sep 28 1236  Sat Sep 28, 2017  1236 Fully the CT scan shows that there is no occlusive findings to cause upper extremity swelling, no other acute findings to explain the patient's symptoms, the patient still has good pulses at the wrist bilaterally symmetrically with normal capillary refill.  Will recommend a mild compressive wrap and close follow-up with family doctor, the patient is  in agreement.  This was also discussed with her daughter, they have expressed their understanding.   [BM]    Clinical Course User Index [BM] Noemi Chapel, MD   The patient is in no distress, she does have some slight asymmetrical swelling of the left upper extremity which is concerning for DVT.  I have not been able to find any issues with compressibility of the veins on my bedside ultrasound however this only goes up to the proximal upper extremity, I will have a formal ultrasound performed.  I discussed the case with Dr. Omar Person of the radiology service who recommends a formal ultrasound including the subclavian vein, the patient may also need to have a CT scan of this does not elucidate the etiology of the swelling.  The patient is currently anticoagulated on rivaroxaban.  She is not hypoxic nor she tachycardic nor ill-appearing.  The pt has no DVT on the [place] - will go for CT scan She is still without other c/o Labs reviewed and unremarkable.  Final Clinical Impressions(s) / ED Diagnoses   Final diagnoses:  Left upper extremity swelling        Noemi Chapel, MD 09/28/17 1238

## 2017-09-28 NOTE — Discharge Instructions (Signed)
Your testing today has been unremarkable showing no signs of blood clots or other blockages that would cause swelling. Please use the ace wrap with a good tight fit to help reduce the swelling - be active with your arm and call your doctor for follow up on Monday or Tuesday.  If you should develop increasing pain or swelling, numbness or weakness,  return to the emergency department immediately

## 2017-09-28 NOTE — ED Triage Notes (Signed)
Pt's hand is swollen (left) no know injury

## 2017-09-28 NOTE — ED Notes (Signed)
Patient transported to Ultrasound 

## 2017-09-28 NOTE — ED Notes (Signed)
Pt in US at this time 

## 2017-10-02 DIAGNOSIS — Z23 Encounter for immunization: Secondary | ICD-10-CM | POA: Diagnosis not present

## 2017-10-03 DIAGNOSIS — S51801D Unspecified open wound of right forearm, subsequent encounter: Secondary | ICD-10-CM | POA: Diagnosis not present

## 2017-10-03 DIAGNOSIS — Z6823 Body mass index (BMI) 23.0-23.9, adult: Secondary | ICD-10-CM | POA: Diagnosis not present

## 2017-10-03 DIAGNOSIS — R600 Localized edema: Secondary | ICD-10-CM | POA: Diagnosis not present

## 2017-10-03 DIAGNOSIS — I10 Essential (primary) hypertension: Secondary | ICD-10-CM | POA: Diagnosis not present

## 2017-10-03 DIAGNOSIS — Z9181 History of falling: Secondary | ICD-10-CM | POA: Diagnosis not present

## 2017-10-04 DIAGNOSIS — G8911 Acute pain due to trauma: Secondary | ICD-10-CM | POA: Diagnosis not present

## 2017-10-04 DIAGNOSIS — F339 Major depressive disorder, recurrent, unspecified: Secondary | ICD-10-CM | POA: Diagnosis not present

## 2017-10-04 DIAGNOSIS — G3184 Mild cognitive impairment, so stated: Secondary | ICD-10-CM | POA: Diagnosis not present

## 2017-10-04 DIAGNOSIS — F419 Anxiety disorder, unspecified: Secondary | ICD-10-CM | POA: Diagnosis not present

## 2017-10-04 DIAGNOSIS — I482 Chronic atrial fibrillation, unspecified: Secondary | ICD-10-CM | POA: Diagnosis not present

## 2017-10-04 DIAGNOSIS — M79601 Pain in right arm: Secondary | ICD-10-CM | POA: Diagnosis not present

## 2017-10-04 DIAGNOSIS — R600 Localized edema: Secondary | ICD-10-CM | POA: Diagnosis not present

## 2017-10-04 DIAGNOSIS — I442 Atrioventricular block, complete: Secondary | ICD-10-CM | POA: Diagnosis not present

## 2017-10-04 DIAGNOSIS — I110 Hypertensive heart disease with heart failure: Secondary | ICD-10-CM | POA: Diagnosis not present

## 2017-10-04 DIAGNOSIS — F0390 Unspecified dementia without behavioral disturbance: Secondary | ICD-10-CM | POA: Diagnosis not present

## 2017-10-04 DIAGNOSIS — I509 Heart failure, unspecified: Secondary | ICD-10-CM | POA: Diagnosis not present

## 2017-10-04 DIAGNOSIS — E782 Mixed hyperlipidemia: Secondary | ICD-10-CM | POA: Diagnosis not present

## 2017-10-04 DIAGNOSIS — M79602 Pain in left arm: Secondary | ICD-10-CM | POA: Diagnosis not present

## 2017-10-07 DIAGNOSIS — E875 Hyperkalemia: Secondary | ICD-10-CM | POA: Diagnosis not present

## 2017-10-07 DIAGNOSIS — R7301 Impaired fasting glucose: Secondary | ICD-10-CM | POA: Diagnosis not present

## 2017-10-07 DIAGNOSIS — I10 Essential (primary) hypertension: Secondary | ICD-10-CM | POA: Diagnosis not present

## 2017-10-07 DIAGNOSIS — R946 Abnormal results of thyroid function studies: Secondary | ICD-10-CM | POA: Diagnosis not present

## 2017-10-07 DIAGNOSIS — E782 Mixed hyperlipidemia: Secondary | ICD-10-CM | POA: Diagnosis not present

## 2017-10-08 DIAGNOSIS — M79602 Pain in left arm: Secondary | ICD-10-CM | POA: Diagnosis not present

## 2017-10-08 DIAGNOSIS — F0390 Unspecified dementia without behavioral disturbance: Secondary | ICD-10-CM | POA: Diagnosis not present

## 2017-10-08 DIAGNOSIS — G8911 Acute pain due to trauma: Secondary | ICD-10-CM | POA: Diagnosis not present

## 2017-10-08 DIAGNOSIS — M79601 Pain in right arm: Secondary | ICD-10-CM | POA: Diagnosis not present

## 2017-10-08 DIAGNOSIS — I110 Hypertensive heart disease with heart failure: Secondary | ICD-10-CM | POA: Diagnosis not present

## 2017-10-08 DIAGNOSIS — R600 Localized edema: Secondary | ICD-10-CM | POA: Diagnosis not present

## 2017-10-11 ENCOUNTER — Other Ambulatory Visit (HOSPITAL_COMMUNITY): Payer: Self-pay | Admitting: Internal Medicine

## 2017-10-11 ENCOUNTER — Ambulatory Visit (HOSPITAL_COMMUNITY)
Admission: RE | Admit: 2017-10-11 | Discharge: 2017-10-11 | Disposition: A | Discharge status: Home / Self Care | Payer: Medicare Other | Source: Ambulatory Visit | Attending: Internal Medicine | Admitting: Internal Medicine

## 2017-10-11 DIAGNOSIS — M79602 Pain in left arm: Secondary | ICD-10-CM | POA: Diagnosis not present

## 2017-10-11 DIAGNOSIS — M79632 Pain in left forearm: Secondary | ICD-10-CM | POA: Diagnosis not present

## 2017-10-11 DIAGNOSIS — M25522 Pain in left elbow: Secondary | ICD-10-CM | POA: Diagnosis not present

## 2017-10-11 DIAGNOSIS — W19XXXA Unspecified fall, initial encounter: Secondary | ICD-10-CM

## 2017-10-11 DIAGNOSIS — I890 Lymphedema, not elsewhere classified: Secondary | ICD-10-CM | POA: Diagnosis not present

## 2017-10-11 DIAGNOSIS — I700 Atherosclerosis of aorta: Secondary | ICD-10-CM | POA: Diagnosis not present

## 2017-10-11 DIAGNOSIS — F0390 Unspecified dementia without behavioral disturbance: Secondary | ICD-10-CM | POA: Diagnosis not present

## 2017-10-11 DIAGNOSIS — M79601 Pain in right arm: Secondary | ICD-10-CM | POA: Diagnosis not present

## 2017-10-11 DIAGNOSIS — R600 Localized edema: Secondary | ICD-10-CM | POA: Diagnosis not present

## 2017-10-11 DIAGNOSIS — Z6823 Body mass index (BMI) 23.0-23.9, adult: Secondary | ICD-10-CM | POA: Diagnosis not present

## 2017-10-11 DIAGNOSIS — G8911 Acute pain due to trauma: Secondary | ICD-10-CM | POA: Diagnosis not present

## 2017-10-11 DIAGNOSIS — F0280 Dementia in other diseases classified elsewhere without behavioral disturbance: Secondary | ICD-10-CM | POA: Diagnosis not present

## 2017-10-11 DIAGNOSIS — I110 Hypertensive heart disease with heart failure: Secondary | ICD-10-CM | POA: Diagnosis not present

## 2017-10-11 DIAGNOSIS — S59902A Unspecified injury of left elbow, initial encounter: Secondary | ICD-10-CM | POA: Diagnosis not present

## 2017-10-11 DIAGNOSIS — S59912A Unspecified injury of left forearm, initial encounter: Secondary | ICD-10-CM | POA: Diagnosis not present

## 2017-10-14 DIAGNOSIS — I110 Hypertensive heart disease with heart failure: Secondary | ICD-10-CM | POA: Diagnosis not present

## 2017-10-14 DIAGNOSIS — G8911 Acute pain due to trauma: Secondary | ICD-10-CM | POA: Diagnosis not present

## 2017-10-14 DIAGNOSIS — M79602 Pain in left arm: Secondary | ICD-10-CM | POA: Diagnosis not present

## 2017-10-14 DIAGNOSIS — F0390 Unspecified dementia without behavioral disturbance: Secondary | ICD-10-CM | POA: Diagnosis not present

## 2017-10-14 DIAGNOSIS — M79601 Pain in right arm: Secondary | ICD-10-CM | POA: Diagnosis not present

## 2017-10-14 DIAGNOSIS — R600 Localized edema: Secondary | ICD-10-CM | POA: Diagnosis not present

## 2017-10-16 DIAGNOSIS — R600 Localized edema: Secondary | ICD-10-CM | POA: Diagnosis not present

## 2017-10-16 DIAGNOSIS — F0390 Unspecified dementia without behavioral disturbance: Secondary | ICD-10-CM | POA: Diagnosis not present

## 2017-10-16 DIAGNOSIS — M79602 Pain in left arm: Secondary | ICD-10-CM | POA: Diagnosis not present

## 2017-10-16 DIAGNOSIS — M79601 Pain in right arm: Secondary | ICD-10-CM | POA: Diagnosis not present

## 2017-10-16 DIAGNOSIS — I110 Hypertensive heart disease with heart failure: Secondary | ICD-10-CM | POA: Diagnosis not present

## 2017-10-16 DIAGNOSIS — G8911 Acute pain due to trauma: Secondary | ICD-10-CM | POA: Diagnosis not present

## 2017-10-16 LAB — CUP PACEART INCLINIC DEVICE CHECK
Date Time Interrogation Session: 20191009124630
Implantable Lead Implant Date: 20190524
Implantable Lead Location: 753860
MDC IDC LEAD IMPLANT DT: 20190524
MDC IDC LEAD LOCATION: 753859
MDC IDC PG IMPLANT DT: 20190524
Pulse Gen Model: 2272
Pulse Gen Serial Number: 9022503

## 2017-10-17 DIAGNOSIS — M79601 Pain in right arm: Secondary | ICD-10-CM | POA: Diagnosis not present

## 2017-10-17 DIAGNOSIS — I110 Hypertensive heart disease with heart failure: Secondary | ICD-10-CM | POA: Diagnosis not present

## 2017-10-17 DIAGNOSIS — F0390 Unspecified dementia without behavioral disturbance: Secondary | ICD-10-CM | POA: Diagnosis not present

## 2017-10-17 DIAGNOSIS — M79602 Pain in left arm: Secondary | ICD-10-CM | POA: Diagnosis not present

## 2017-10-17 DIAGNOSIS — G8911 Acute pain due to trauma: Secondary | ICD-10-CM | POA: Diagnosis not present

## 2017-10-17 DIAGNOSIS — R600 Localized edema: Secondary | ICD-10-CM | POA: Diagnosis not present

## 2017-10-18 DIAGNOSIS — I110 Hypertensive heart disease with heart failure: Secondary | ICD-10-CM | POA: Diagnosis not present

## 2017-10-18 DIAGNOSIS — G8911 Acute pain due to trauma: Secondary | ICD-10-CM | POA: Diagnosis not present

## 2017-10-18 DIAGNOSIS — M79602 Pain in left arm: Secondary | ICD-10-CM | POA: Diagnosis not present

## 2017-10-18 DIAGNOSIS — R600 Localized edema: Secondary | ICD-10-CM | POA: Diagnosis not present

## 2017-10-18 DIAGNOSIS — F0390 Unspecified dementia without behavioral disturbance: Secondary | ICD-10-CM | POA: Diagnosis not present

## 2017-10-18 DIAGNOSIS — M79601 Pain in right arm: Secondary | ICD-10-CM | POA: Diagnosis not present

## 2017-10-22 DIAGNOSIS — R600 Localized edema: Secondary | ICD-10-CM | POA: Diagnosis not present

## 2017-10-22 DIAGNOSIS — M79601 Pain in right arm: Secondary | ICD-10-CM | POA: Diagnosis not present

## 2017-10-22 DIAGNOSIS — F0390 Unspecified dementia without behavioral disturbance: Secondary | ICD-10-CM | POA: Diagnosis not present

## 2017-10-22 DIAGNOSIS — M79602 Pain in left arm: Secondary | ICD-10-CM | POA: Diagnosis not present

## 2017-10-22 DIAGNOSIS — I110 Hypertensive heart disease with heart failure: Secondary | ICD-10-CM | POA: Diagnosis not present

## 2017-10-22 DIAGNOSIS — G8911 Acute pain due to trauma: Secondary | ICD-10-CM | POA: Diagnosis not present

## 2017-10-23 DIAGNOSIS — R600 Localized edema: Secondary | ICD-10-CM | POA: Diagnosis not present

## 2017-10-23 DIAGNOSIS — I110 Hypertensive heart disease with heart failure: Secondary | ICD-10-CM | POA: Diagnosis not present

## 2017-10-23 DIAGNOSIS — M79602 Pain in left arm: Secondary | ICD-10-CM | POA: Diagnosis not present

## 2017-10-23 DIAGNOSIS — F0390 Unspecified dementia without behavioral disturbance: Secondary | ICD-10-CM | POA: Diagnosis not present

## 2017-10-23 DIAGNOSIS — G8911 Acute pain due to trauma: Secondary | ICD-10-CM | POA: Diagnosis not present

## 2017-10-23 DIAGNOSIS — M79601 Pain in right arm: Secondary | ICD-10-CM | POA: Diagnosis not present

## 2017-10-25 DIAGNOSIS — F0390 Unspecified dementia without behavioral disturbance: Secondary | ICD-10-CM | POA: Diagnosis not present

## 2017-10-25 DIAGNOSIS — M79602 Pain in left arm: Secondary | ICD-10-CM | POA: Diagnosis not present

## 2017-10-25 DIAGNOSIS — G8911 Acute pain due to trauma: Secondary | ICD-10-CM | POA: Diagnosis not present

## 2017-10-25 DIAGNOSIS — I110 Hypertensive heart disease with heart failure: Secondary | ICD-10-CM | POA: Diagnosis not present

## 2017-10-25 DIAGNOSIS — M79601 Pain in right arm: Secondary | ICD-10-CM | POA: Diagnosis not present

## 2017-10-25 DIAGNOSIS — R600 Localized edema: Secondary | ICD-10-CM | POA: Diagnosis not present

## 2017-10-26 DIAGNOSIS — F0390 Unspecified dementia without behavioral disturbance: Secondary | ICD-10-CM | POA: Diagnosis not present

## 2017-10-26 DIAGNOSIS — R600 Localized edema: Secondary | ICD-10-CM | POA: Diagnosis not present

## 2017-10-26 DIAGNOSIS — M79601 Pain in right arm: Secondary | ICD-10-CM | POA: Diagnosis not present

## 2017-10-26 DIAGNOSIS — I110 Hypertensive heart disease with heart failure: Secondary | ICD-10-CM | POA: Diagnosis not present

## 2017-10-26 DIAGNOSIS — M79602 Pain in left arm: Secondary | ICD-10-CM | POA: Diagnosis not present

## 2017-10-26 DIAGNOSIS — G8911 Acute pain due to trauma: Secondary | ICD-10-CM | POA: Diagnosis not present

## 2017-10-29 DIAGNOSIS — M79602 Pain in left arm: Secondary | ICD-10-CM | POA: Diagnosis not present

## 2017-10-29 DIAGNOSIS — F0390 Unspecified dementia without behavioral disturbance: Secondary | ICD-10-CM | POA: Diagnosis not present

## 2017-10-29 DIAGNOSIS — M79601 Pain in right arm: Secondary | ICD-10-CM | POA: Diagnosis not present

## 2017-10-29 DIAGNOSIS — R600 Localized edema: Secondary | ICD-10-CM | POA: Diagnosis not present

## 2017-10-29 DIAGNOSIS — I110 Hypertensive heart disease with heart failure: Secondary | ICD-10-CM | POA: Diagnosis not present

## 2017-10-29 DIAGNOSIS — G8911 Acute pain due to trauma: Secondary | ICD-10-CM | POA: Diagnosis not present

## 2017-10-30 DIAGNOSIS — M79602 Pain in left arm: Secondary | ICD-10-CM | POA: Diagnosis not present

## 2017-10-30 DIAGNOSIS — G8911 Acute pain due to trauma: Secondary | ICD-10-CM | POA: Diagnosis not present

## 2017-10-30 DIAGNOSIS — R600 Localized edema: Secondary | ICD-10-CM | POA: Diagnosis not present

## 2017-10-30 DIAGNOSIS — I110 Hypertensive heart disease with heart failure: Secondary | ICD-10-CM | POA: Diagnosis not present

## 2017-10-30 DIAGNOSIS — M79601 Pain in right arm: Secondary | ICD-10-CM | POA: Diagnosis not present

## 2017-10-30 DIAGNOSIS — F0390 Unspecified dementia without behavioral disturbance: Secondary | ICD-10-CM | POA: Diagnosis not present

## 2017-10-31 DIAGNOSIS — M79601 Pain in right arm: Secondary | ICD-10-CM | POA: Diagnosis not present

## 2017-10-31 DIAGNOSIS — M79602 Pain in left arm: Secondary | ICD-10-CM | POA: Diagnosis not present

## 2017-10-31 DIAGNOSIS — I110 Hypertensive heart disease with heart failure: Secondary | ICD-10-CM | POA: Diagnosis not present

## 2017-10-31 DIAGNOSIS — F0390 Unspecified dementia without behavioral disturbance: Secondary | ICD-10-CM | POA: Diagnosis not present

## 2017-10-31 DIAGNOSIS — G8911 Acute pain due to trauma: Secondary | ICD-10-CM | POA: Diagnosis not present

## 2017-10-31 DIAGNOSIS — R600 Localized edema: Secondary | ICD-10-CM | POA: Diagnosis not present

## 2017-11-01 DIAGNOSIS — F0390 Unspecified dementia without behavioral disturbance: Secondary | ICD-10-CM | POA: Diagnosis not present

## 2017-11-01 DIAGNOSIS — I110 Hypertensive heart disease with heart failure: Secondary | ICD-10-CM | POA: Diagnosis not present

## 2017-11-01 DIAGNOSIS — G8911 Acute pain due to trauma: Secondary | ICD-10-CM | POA: Diagnosis not present

## 2017-11-01 DIAGNOSIS — M79602 Pain in left arm: Secondary | ICD-10-CM | POA: Diagnosis not present

## 2017-11-01 DIAGNOSIS — M79601 Pain in right arm: Secondary | ICD-10-CM | POA: Diagnosis not present

## 2017-11-01 DIAGNOSIS — R600 Localized edema: Secondary | ICD-10-CM | POA: Diagnosis not present

## 2017-11-06 DIAGNOSIS — F0390 Unspecified dementia without behavioral disturbance: Secondary | ICD-10-CM | POA: Diagnosis not present

## 2017-11-06 DIAGNOSIS — G8911 Acute pain due to trauma: Secondary | ICD-10-CM | POA: Diagnosis not present

## 2017-11-06 DIAGNOSIS — R600 Localized edema: Secondary | ICD-10-CM | POA: Diagnosis not present

## 2017-11-06 DIAGNOSIS — I110 Hypertensive heart disease with heart failure: Secondary | ICD-10-CM | POA: Diagnosis not present

## 2017-11-06 DIAGNOSIS — M79601 Pain in right arm: Secondary | ICD-10-CM | POA: Diagnosis not present

## 2017-11-06 DIAGNOSIS — M79602 Pain in left arm: Secondary | ICD-10-CM | POA: Diagnosis not present

## 2017-11-07 DIAGNOSIS — R600 Localized edema: Secondary | ICD-10-CM | POA: Diagnosis not present

## 2017-11-07 DIAGNOSIS — G8911 Acute pain due to trauma: Secondary | ICD-10-CM | POA: Diagnosis not present

## 2017-11-07 DIAGNOSIS — M79601 Pain in right arm: Secondary | ICD-10-CM | POA: Diagnosis not present

## 2017-11-07 DIAGNOSIS — M79602 Pain in left arm: Secondary | ICD-10-CM | POA: Diagnosis not present

## 2017-11-07 DIAGNOSIS — I110 Hypertensive heart disease with heart failure: Secondary | ICD-10-CM | POA: Diagnosis not present

## 2017-11-07 DIAGNOSIS — F0390 Unspecified dementia without behavioral disturbance: Secondary | ICD-10-CM | POA: Diagnosis not present

## 2017-11-12 DIAGNOSIS — I110 Hypertensive heart disease with heart failure: Secondary | ICD-10-CM | POA: Diagnosis not present

## 2017-11-12 DIAGNOSIS — G8911 Acute pain due to trauma: Secondary | ICD-10-CM | POA: Diagnosis not present

## 2017-11-12 DIAGNOSIS — F0390 Unspecified dementia without behavioral disturbance: Secondary | ICD-10-CM | POA: Diagnosis not present

## 2017-11-12 DIAGNOSIS — M79601 Pain in right arm: Secondary | ICD-10-CM | POA: Diagnosis not present

## 2017-11-12 DIAGNOSIS — M79602 Pain in left arm: Secondary | ICD-10-CM | POA: Diagnosis not present

## 2017-11-12 DIAGNOSIS — R600 Localized edema: Secondary | ICD-10-CM | POA: Diagnosis not present

## 2017-11-15 DIAGNOSIS — R600 Localized edema: Secondary | ICD-10-CM | POA: Diagnosis not present

## 2017-11-15 DIAGNOSIS — M79601 Pain in right arm: Secondary | ICD-10-CM | POA: Diagnosis not present

## 2017-11-15 DIAGNOSIS — M79602 Pain in left arm: Secondary | ICD-10-CM | POA: Diagnosis not present

## 2017-11-15 DIAGNOSIS — G8911 Acute pain due to trauma: Secondary | ICD-10-CM | POA: Diagnosis not present

## 2017-11-15 DIAGNOSIS — F0390 Unspecified dementia without behavioral disturbance: Secondary | ICD-10-CM | POA: Diagnosis not present

## 2017-11-15 DIAGNOSIS — I110 Hypertensive heart disease with heart failure: Secondary | ICD-10-CM | POA: Diagnosis not present

## 2017-11-18 DIAGNOSIS — I110 Hypertensive heart disease with heart failure: Secondary | ICD-10-CM | POA: Diagnosis not present

## 2017-11-18 DIAGNOSIS — M79601 Pain in right arm: Secondary | ICD-10-CM | POA: Diagnosis not present

## 2017-11-18 DIAGNOSIS — F0390 Unspecified dementia without behavioral disturbance: Secondary | ICD-10-CM | POA: Diagnosis not present

## 2017-11-18 DIAGNOSIS — M79602 Pain in left arm: Secondary | ICD-10-CM | POA: Diagnosis not present

## 2017-11-18 DIAGNOSIS — G8911 Acute pain due to trauma: Secondary | ICD-10-CM | POA: Diagnosis not present

## 2017-11-18 DIAGNOSIS — R600 Localized edema: Secondary | ICD-10-CM | POA: Diagnosis not present

## 2017-11-21 DIAGNOSIS — R600 Localized edema: Secondary | ICD-10-CM | POA: Diagnosis not present

## 2017-11-21 DIAGNOSIS — G8911 Acute pain due to trauma: Secondary | ICD-10-CM | POA: Diagnosis not present

## 2017-11-21 DIAGNOSIS — I110 Hypertensive heart disease with heart failure: Secondary | ICD-10-CM | POA: Diagnosis not present

## 2017-11-21 DIAGNOSIS — M79601 Pain in right arm: Secondary | ICD-10-CM | POA: Diagnosis not present

## 2017-11-21 DIAGNOSIS — F0390 Unspecified dementia without behavioral disturbance: Secondary | ICD-10-CM | POA: Diagnosis not present

## 2017-11-21 DIAGNOSIS — M79602 Pain in left arm: Secondary | ICD-10-CM | POA: Diagnosis not present

## 2017-11-25 DIAGNOSIS — R600 Localized edema: Secondary | ICD-10-CM | POA: Diagnosis not present

## 2017-11-25 DIAGNOSIS — M79601 Pain in right arm: Secondary | ICD-10-CM | POA: Diagnosis not present

## 2017-11-25 DIAGNOSIS — M79602 Pain in left arm: Secondary | ICD-10-CM | POA: Diagnosis not present

## 2017-11-25 DIAGNOSIS — G8911 Acute pain due to trauma: Secondary | ICD-10-CM | POA: Diagnosis not present

## 2017-11-25 DIAGNOSIS — I110 Hypertensive heart disease with heart failure: Secondary | ICD-10-CM | POA: Diagnosis not present

## 2017-11-25 DIAGNOSIS — F0390 Unspecified dementia without behavioral disturbance: Secondary | ICD-10-CM | POA: Diagnosis not present

## 2017-12-18 ENCOUNTER — Ambulatory Visit (INDEPENDENT_AMBULATORY_CARE_PROVIDER_SITE_OTHER): Payer: Medicare Other

## 2017-12-18 DIAGNOSIS — R001 Bradycardia, unspecified: Secondary | ICD-10-CM

## 2017-12-18 DIAGNOSIS — I442 Atrioventricular block, complete: Secondary | ICD-10-CM | POA: Diagnosis not present

## 2017-12-19 NOTE — Progress Notes (Signed)
Remote pacemaker transmission.   

## 2017-12-20 ENCOUNTER — Encounter: Payer: Self-pay | Admitting: Cardiology

## 2018-02-01 LAB — CUP PACEART REMOTE DEVICE CHECK
Battery Remaining Longevity: 115 mo
Battery Remaining Percentage: 95.5 %
Brady Statistic AP VS Percent: 1 %
Brady Statistic AS VP Percent: 66 %
Brady Statistic AS VS Percent: 1 %
Implantable Lead Implant Date: 20190524
Implantable Lead Location: 753860
Implantable Pulse Generator Implant Date: 20190524
Lead Channel Impedance Value: 480 Ohm
Lead Channel Pacing Threshold Amplitude: 0.75 V
Lead Channel Pacing Threshold Pulse Width: 0.5 ms
Lead Channel Sensing Intrinsic Amplitude: 2.5 mV
Lead Channel Setting Pacing Pulse Width: 0.5 ms
MDC IDC LEAD IMPLANT DT: 20190524
MDC IDC LEAD LOCATION: 753859
MDC IDC MSMT BATTERY VOLTAGE: 3.02 V
MDC IDC MSMT LEADCHNL RV IMPEDANCE VALUE: 630 Ohm
MDC IDC MSMT LEADCHNL RV PACING THRESHOLD AMPLITUDE: 0.5 V
MDC IDC MSMT LEADCHNL RV PACING THRESHOLD PULSEWIDTH: 0.5 ms
MDC IDC MSMT LEADCHNL RV SENSING INTR AMPL: 12 mV
MDC IDC PG SERIAL: 9022503
MDC IDC SESS DTM: 20191211070014
MDC IDC SET LEADCHNL RA PACING AMPLITUDE: 2 V
MDC IDC SET LEADCHNL RV PACING AMPLITUDE: 0.75 V
MDC IDC SET LEADCHNL RV SENSING SENSITIVITY: 2 mV
MDC IDC STAT BRADY AP VP PERCENT: 34 %
MDC IDC STAT BRADY RA PERCENT PACED: 40 %
MDC IDC STAT BRADY RV PERCENT PACED: 99 %

## 2018-02-18 DIAGNOSIS — R531 Weakness: Secondary | ICD-10-CM | POA: Diagnosis not present

## 2018-02-18 DIAGNOSIS — R062 Wheezing: Secondary | ICD-10-CM | POA: Diagnosis not present

## 2018-02-18 DIAGNOSIS — R5381 Other malaise: Secondary | ICD-10-CM | POA: Diagnosis not present

## 2018-02-18 DIAGNOSIS — J060 Acute laryngopharyngitis: Secondary | ICD-10-CM | POA: Diagnosis not present

## 2018-02-18 DIAGNOSIS — R05 Cough: Secondary | ICD-10-CM | POA: Diagnosis not present

## 2018-02-18 DIAGNOSIS — R0602 Shortness of breath: Secondary | ICD-10-CM | POA: Diagnosis not present

## 2018-02-18 DIAGNOSIS — R0981 Nasal congestion: Secondary | ICD-10-CM | POA: Diagnosis not present

## 2018-03-19 ENCOUNTER — Ambulatory Visit (INDEPENDENT_AMBULATORY_CARE_PROVIDER_SITE_OTHER): Payer: Medicare Other | Admitting: *Deleted

## 2018-03-19 ENCOUNTER — Other Ambulatory Visit: Payer: Self-pay

## 2018-03-19 DIAGNOSIS — R001 Bradycardia, unspecified: Secondary | ICD-10-CM

## 2018-03-19 DIAGNOSIS — I442 Atrioventricular block, complete: Secondary | ICD-10-CM | POA: Diagnosis not present

## 2018-03-20 LAB — CUP PACEART REMOTE DEVICE CHECK
Battery Remaining Longevity: 108 mo
Brady Statistic AP VP Percent: 52 %
Brady Statistic AS VP Percent: 48 %
Brady Statistic RA Percent Paced: 56 %
Date Time Interrogation Session: 20200311072813
Implantable Lead Implant Date: 20190524
Implantable Lead Location: 753860
Lead Channel Impedance Value: 610 Ohm
Lead Channel Pacing Threshold Pulse Width: 0.5 ms
Lead Channel Sensing Intrinsic Amplitude: 12 mV
Lead Channel Setting Pacing Amplitude: 2 V
Lead Channel Setting Sensing Sensitivity: 2 mV
MDC IDC LEAD IMPLANT DT: 20190524
MDC IDC LEAD LOCATION: 753859
MDC IDC MSMT BATTERY REMAINING PERCENTAGE: 95.5 %
MDC IDC MSMT BATTERY VOLTAGE: 3.02 V
MDC IDC MSMT LEADCHNL RA IMPEDANCE VALUE: 410 Ohm
MDC IDC MSMT LEADCHNL RA PACING THRESHOLD AMPLITUDE: 0.75 V
MDC IDC MSMT LEADCHNL RA SENSING INTR AMPL: 1.3 mV
MDC IDC MSMT LEADCHNL RV PACING THRESHOLD AMPLITUDE: 0.5 V
MDC IDC MSMT LEADCHNL RV PACING THRESHOLD PULSEWIDTH: 0.5 ms
MDC IDC PG IMPLANT DT: 20190524
MDC IDC SET LEADCHNL RV PACING AMPLITUDE: 0.75 V
MDC IDC SET LEADCHNL RV PACING PULSEWIDTH: 0.5 ms
MDC IDC STAT BRADY AP VS PERCENT: 1 %
MDC IDC STAT BRADY AS VS PERCENT: 1 %
MDC IDC STAT BRADY RV PERCENT PACED: 99 %
Pulse Gen Model: 2272
Pulse Gen Serial Number: 9022503

## 2018-03-26 NOTE — Progress Notes (Signed)
Remote pacemaker transmission.   

## 2018-04-17 DIAGNOSIS — L600 Ingrowing nail: Secondary | ICD-10-CM | POA: Diagnosis not present

## 2018-04-17 DIAGNOSIS — M79675 Pain in left toe(s): Secondary | ICD-10-CM | POA: Diagnosis not present

## 2018-04-17 DIAGNOSIS — M79674 Pain in right toe(s): Secondary | ICD-10-CM | POA: Diagnosis not present

## 2018-05-01 DIAGNOSIS — G3184 Mild cognitive impairment, so stated: Secondary | ICD-10-CM | POA: Diagnosis not present

## 2018-05-01 DIAGNOSIS — I5041 Acute combined systolic (congestive) and diastolic (congestive) heart failure: Secondary | ICD-10-CM | POA: Diagnosis not present

## 2018-05-01 DIAGNOSIS — I700 Atherosclerosis of aorta: Secondary | ICD-10-CM | POA: Diagnosis not present

## 2018-05-01 DIAGNOSIS — I890 Lymphedema, not elsewhere classified: Secondary | ICD-10-CM | POA: Diagnosis not present

## 2018-05-01 DIAGNOSIS — I482 Chronic atrial fibrillation, unspecified: Secondary | ICD-10-CM | POA: Diagnosis not present

## 2018-05-30 ENCOUNTER — Encounter: Payer: Self-pay | Admitting: Internal Medicine

## 2018-05-30 ENCOUNTER — Telehealth (INDEPENDENT_AMBULATORY_CARE_PROVIDER_SITE_OTHER): Payer: Medicare Other | Admitting: Internal Medicine

## 2018-05-30 VITALS — BP 118/64 | HR 68 | Ht 63.0 in | Wt 143.0 lb

## 2018-05-30 DIAGNOSIS — I10 Essential (primary) hypertension: Secondary | ICD-10-CM | POA: Diagnosis not present

## 2018-05-30 DIAGNOSIS — I4821 Permanent atrial fibrillation: Secondary | ICD-10-CM | POA: Diagnosis not present

## 2018-05-30 DIAGNOSIS — I442 Atrioventricular block, complete: Secondary | ICD-10-CM

## 2018-05-30 NOTE — Patient Instructions (Signed)
Medication Instructions: Your physician recommends that you continue on your current medications as directed. Please refer to the Current Medication list given to you today.   Labwork: None today  Procedures/Testing: None today  Follow-Up: 1 year with Dr.Taylor  Any Additional Special Instructions Will Be Listed Below (If Applicable).     If you need a refill on your cardiac medications before your next appointment, please call your pharmacy.     Thank you for choosing [facility] !        

## 2018-05-30 NOTE — Progress Notes (Signed)
Electrophysiology TeleHealth Note   Due to national recommendations of social distancing due to COVID 19, an audio/video telehealth visit is felt to be most appropriate for this patient at this time.  See MyChart message from today for the patient's consent to telehealth for [facility].   Date:  05/30/2018   ID:  Renee Cameron, DOB [birth year], MRN [id number]  Location: patient's home  Provider location: [address] [place]  Evaluation Performed: Follow-up visit  PCP:  Celene Squibb, MD  Cardiologist:  Kate Sable, MD  Electrophysiologist:  Dr Lovena Le  Chief Complaint:  "They are treating me nice here."  History of Present Illness:    Renee Cameron is a 85 y.o. female who presents via audio/video conferencing for a telehealth visit today. She has a h/o CHB, s/p PPM insertion. She lives in [place]. Her PPM was placed a year ago.  Since last being seen in our clinic, the patient reports doing very well. "They take good care of me over here. I love it." Today, she denies symptoms of palpitations, chest pain, shortness of breath,  lower extremity edema, dizziness, presyncope, or syncope.  The patient is otherwise without complaint today.  The patient denies symptoms of fevers, chills, cough, or new SOB worrisome for COVID 19.  Past Medical History:  Diagnosis Date  . Chronic anticoagulation   . Complete heart block ([place]) 05/2017  . Complete heart block ([place])    a. s/p STJ PPM 05/2017.  Marland Kitchen Dementia ([place])   . Hypertension    unspecified  . Hyperthyroidism 2000   2000  . Osteoporosis   . Permanent atrial fibrillation 1993   permanent; onset in 1993    Past Surgical History:  Procedure Laterality Date  . ABDOMINAL HYSTERECTOMY    . APPENDECTOMY    . COLONOSCOPY  2005   Dr. Laural Golden: few small diverticula. external hemorrhoids  . PACEMAKER IMPLANT N/A 05/31/2017   Procedure: PACEMAKER IMPLANT;  Surgeon: Evans Lance, MD;   Location: [facility];  Service: Cardiovascular;  Laterality: N/A;  . TOTAL ABDOMINAL HYSTERECTOMY W/ BILATERAL SALPINGOOPHORECTOMY      Current Outpatient Medications  Medication Sig Dispense Refill  . ALPRAZolam (XANAX) 0.5 MG tablet Take 0.5 mg by mouth 2 (two) times daily.     Marland Kitchen desvenlafaxine (PRISTIQ) 50 MG 24 hr tablet Take 1 tablet by mouth daily.    Marland Kitchen donepezil (ARICEPT) 5 MG tablet Take 5 mg by mouth at bedtime.  2  . furosemide (LASIX) 20 MG tablet Take 20 mg by mouth daily.    Marland Kitchen lisinopril (PRINIVIL,ZESTRIL) 20 MG tablet TAKE 1 TABLET(20 MG) BY MOUTH DAILY 90 tablet 0  . MEGARED OMEGA-3 KRILL OIL 500 MG CAPS Take 500 mg by mouth daily at 12 noon.    . memantine (NAMENDA) 10 MG tablet Take 1 tablet by mouth daily.    . metoprolol succinate (TOPROL-XL) 25 MG 24 hr tablet TAKE 1 TABLET BY MOUTH EVERY DAY 90 tablet 3  . Multiple Vitamins-Minerals (PRESERVISION AREDS 2+MULTI VIT) CAPS Take 2 capsules by mouth daily.    . potassium chloride (K-DUR,KLOR-CON) 10 MEQ tablet Take 1 tablet by mouth daily.    Alveda Reasons 15 MG TABS tablet TAKE 1 TABLET(15 MG) BY MOUTH DAILY WITH DINNER 30 tablet 6   No current facility-administered medications for this visit.     Allergies:   Patient has no known allergies.   Social History:  The patient  reports that she has never smoked. She has never used smokeless tobacco. She reports that she does not drink alcohol or use drugs.   Family History:  The patient's  family history includes Coronary artery disease in her sister; Heart disease in her mother; Prostate cancer in her father; Stroke in her father.   ROS:  Please see the history of present illness.   All other systems are personally reviewed and negative.    Exam:    Vital Signs:  BP 118/64   Pulse 68   Ht 5\' 3"  (1.6 m)   Wt 143 lb (64.9 kg)   BMI 25.33 kg/m   Well appearing, alert and conversant, regular work of breathing,  good skin color Eyes- anicteric, neuro- grossly  intact, skin- no apparent rash or lesions or cyanosis, mouth- oral mucosa is pink   Labs/Other Tests and Data Reviewed:    Recent Labs: 09/28/2017: ALT 11; BUN 11; Creatinine, Ser 0.89; Hemoglobin 13.8; Platelets 177; Potassium 4.0; Sodium 135   Wt Readings from Last 3 Encounters:  05/30/18 143 lb (64.9 kg)  09/28/17 146 lb (66.2 kg)  09/19/17 140 lb (63.5 kg)     Other studies personally reviewed: Last device remote is reviewed from [facility] PDF dated 03/20/18 which reveals normal device function, no arrhythmias    ASSESSMENT & PLAN:    1.  CHB - she is asymptomatic, s/p PPM insertion with no more syncope. 2. HTN - her pressure is well controlled. No change in meds. 3. PPM - her [employer] Jude device is working normally. We will recheck in several months. 4. COVID 19 screen The patient denies symptoms of COVID 19 at this time.  The importance of social distancing was discussed today.  Follow-up:  1 year Next remote: 1 month  Current medicines are reviewed at length with the patient today.   The patient does not have concerns regarding her medicines.  The following changes were made today:  none  Labs/ tests ordered today include: none No orders of the defined types were placed in this encounter.    Patient Risk:  after full review of this patients clinical status, I feel that they are at moderate risk at this time.  Today, I have spent 15 minutes with the patient with telehealth technology discussing all of the above .    Signed, Cristopher Peru, MD  05/30/2018 2:00 PM     [address] [place] [address] [phone] (office) [phone] (fax)

## 2018-06-04 DIAGNOSIS — Z03818 Encounter for observation for suspected exposure to other biological agents ruled out: Secondary | ICD-10-CM | POA: Diagnosis not present

## 2018-06-08 IMAGING — CR DG CHEST 1V PORT
1 series · 1 of 1 positions shown · non-contrast
Comparison: 04/26/2016

CLINICAL DATA: Nausea, weakness and bradycardia.

EXAM:
PORTABLE CHEST 1 VIEW

[portable]
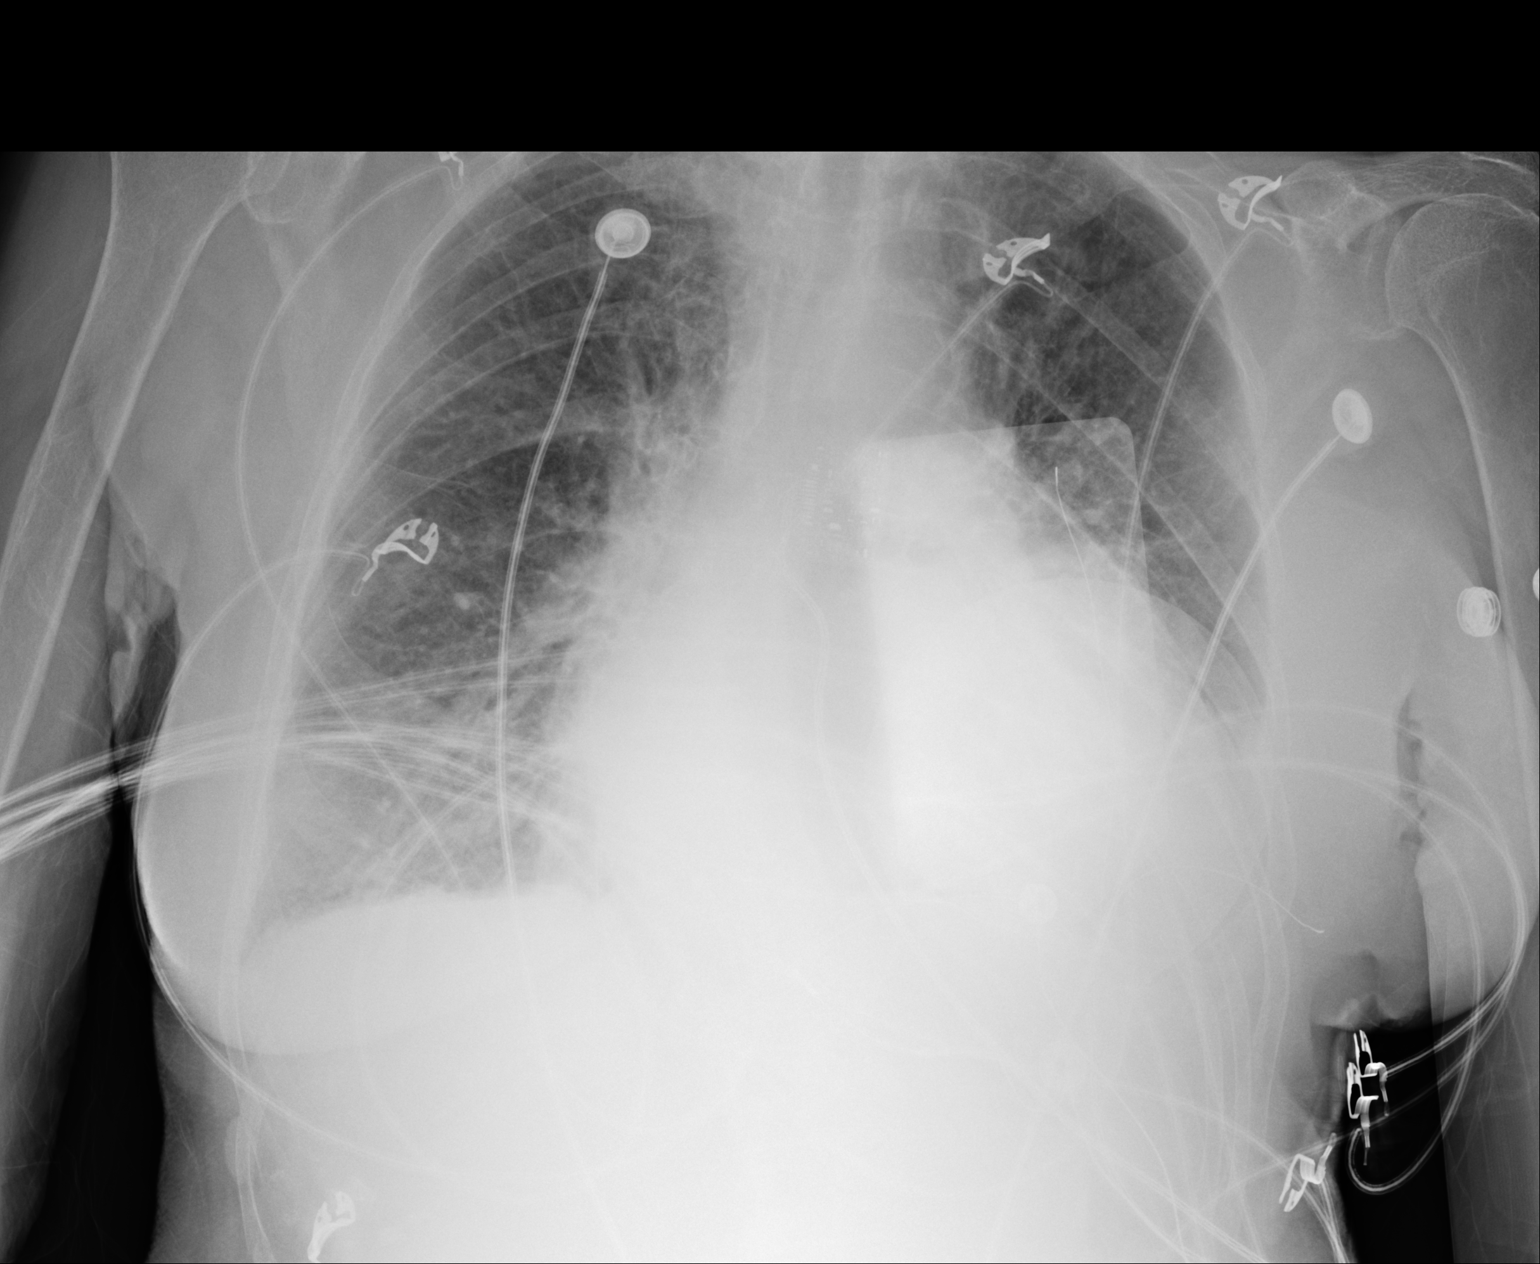

[1 of 1 positions shown; findings below may reference images not displayed]

FINDINGS: Stable cardiac enlargement. Temporary pacing pads present. Lungs
show no overt airspace edema but potentially mild interstitial
edema. No pneumothorax or significant pleural effusions.
IMPRESSION: Cardiac enlargement and potential mild interstitial edema.

## 2018-06-11 IMAGING — CR DG CHEST 2V
2 series · 2 of 2 positions shown · non-contrast
Comparison: 05/29/2016

CLINICAL DATA: Pacemaker placed yesterday

EXAM:
CHEST - 2 VIEW

[chest pa]
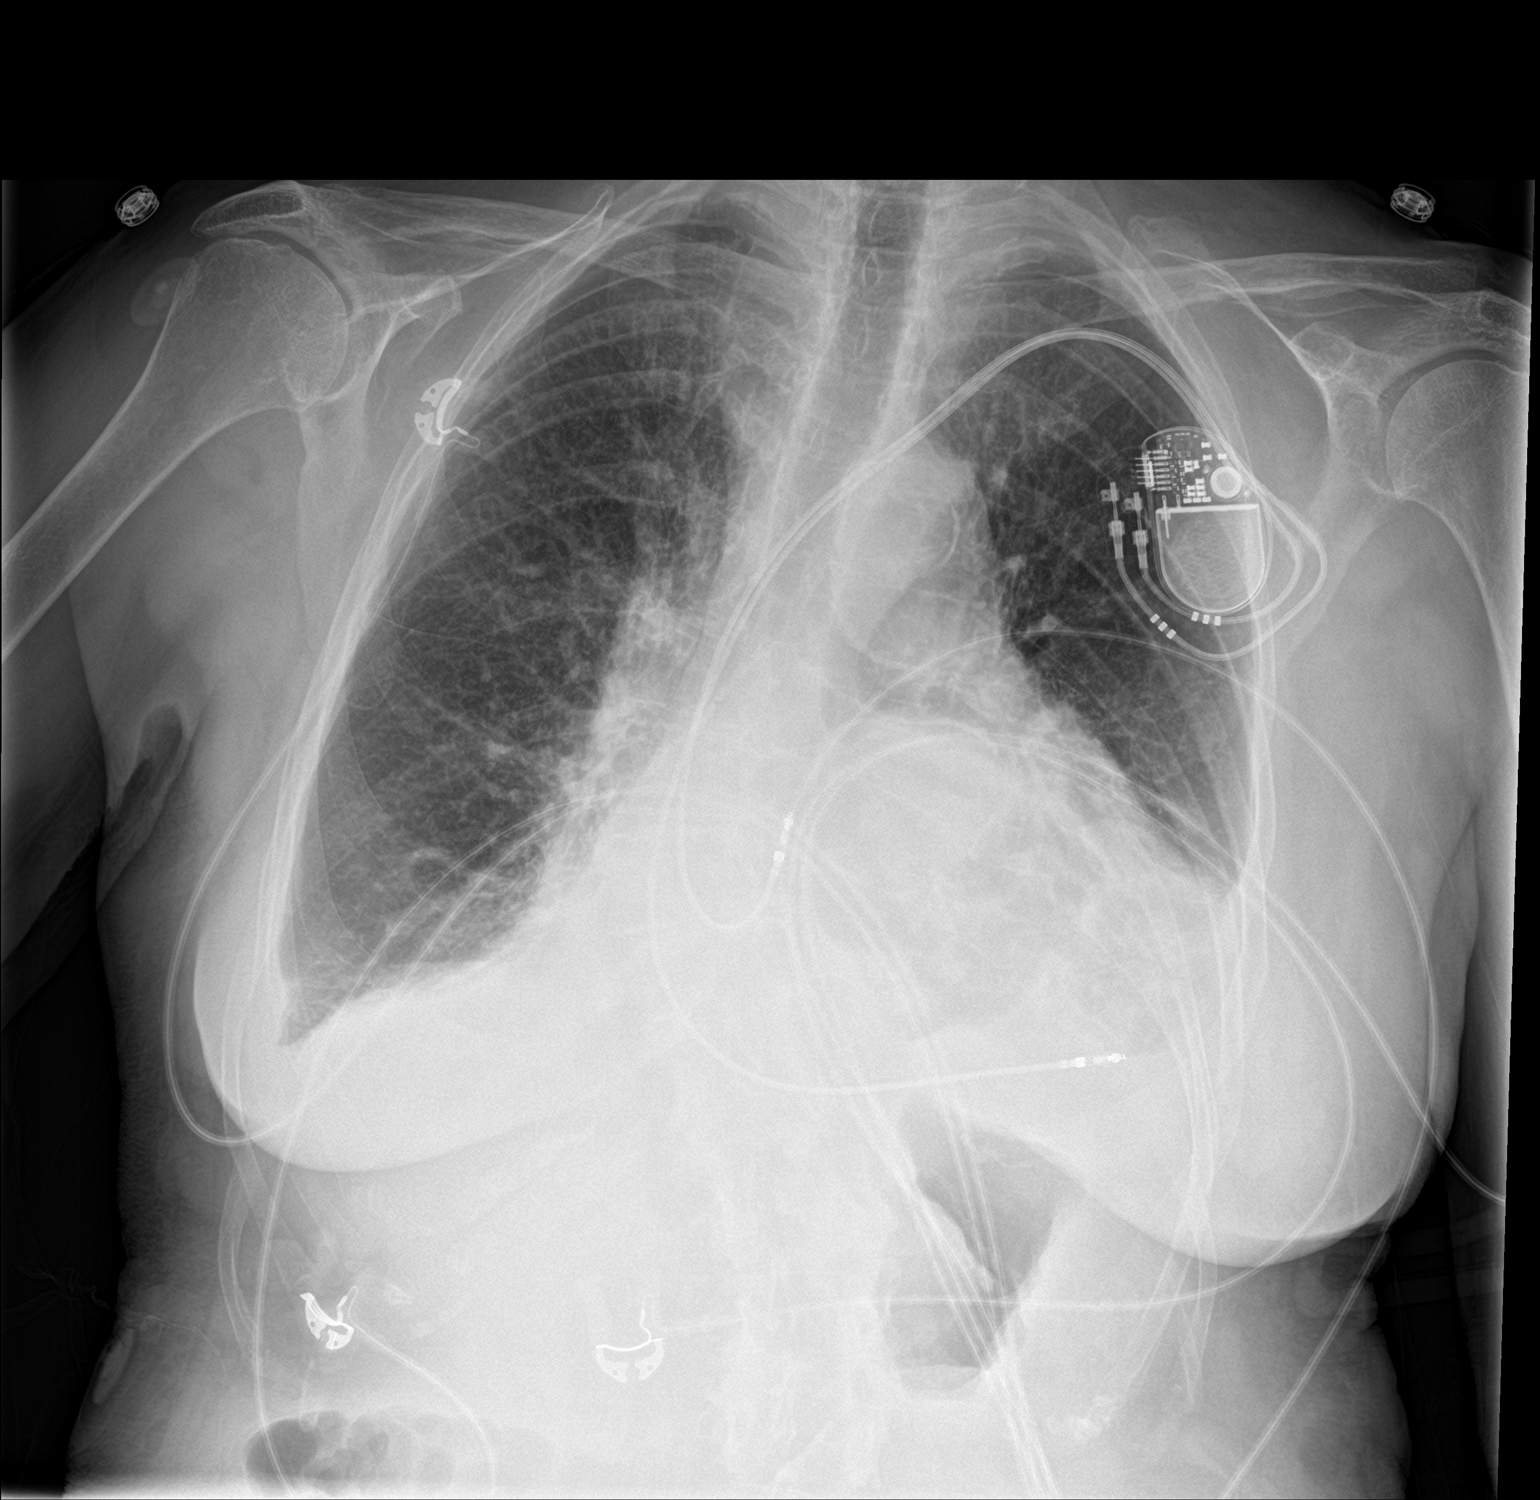

[chest lat]
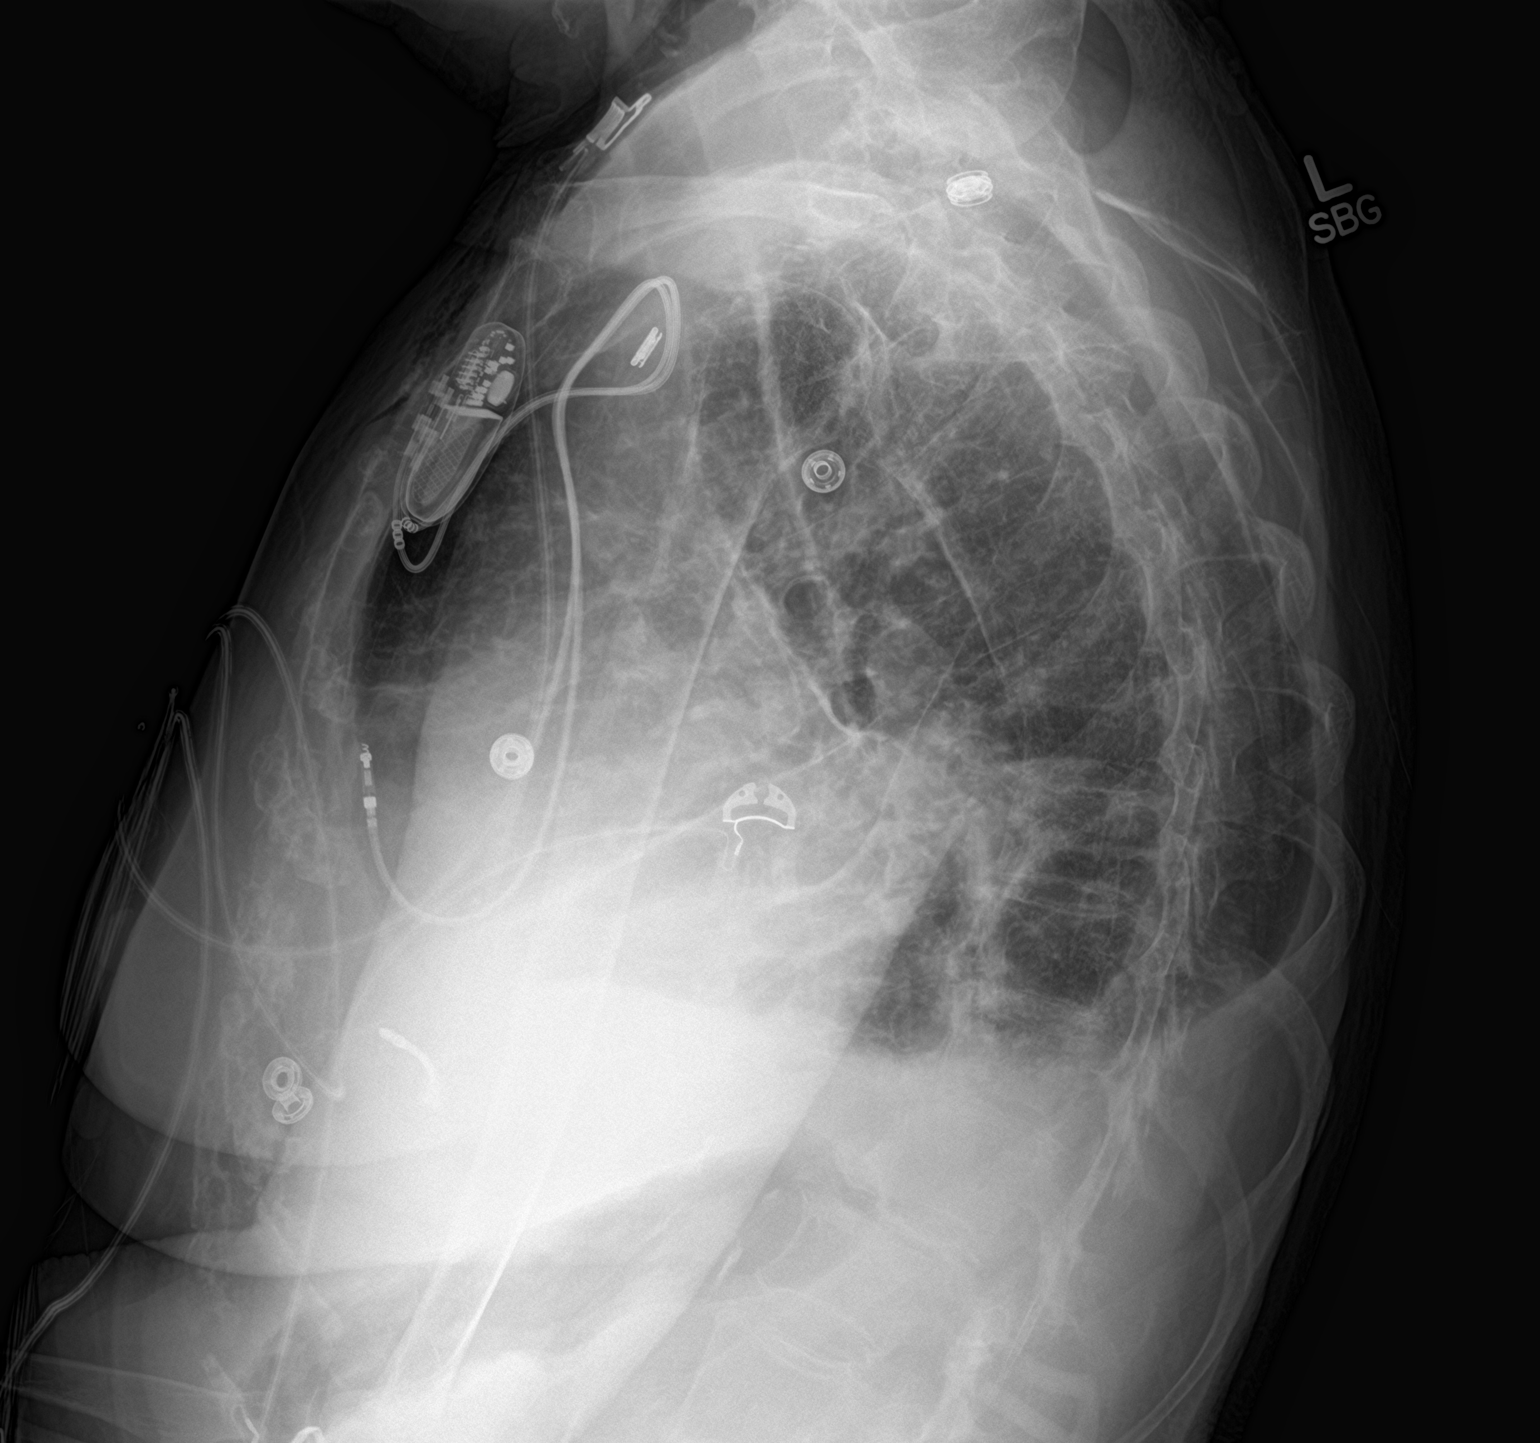

[2 of 2 positions shown; findings below may reference images not displayed]

FINDINGS: Dual lead pacer with proximal distal leads projecting over the right
atrium and ventricle, respectively. No pneumothorax or complicating
feature.

Continued indistinct airspace opacity at the left lung base. Mild
blunting of both lateral costophrenic angles. Medial volume loss at
the right lung base.

Mild cardiomegaly. Atherosclerotic calcification of the aortic arch.
Blunting of the posterior costophrenic angles. Bony
demineralization.
IMPRESSION: 1. Pacer device in place without complicating feature.
2. Small bilateral pleural effusions with suspected atelectasis
medially in the right middle lobe and in the left lower lobe.
3. Mild enlargement of the cardiopericardial silhouette, without
edema.
4.  Aortic Atherosclerosis ([id number]-[id number].S).

## 2018-06-16 IMAGING — DX DG CHEST 2V
2 series · 2 of 2 positions shown · non-contrast
Comparison: 06/01/2017.

CLINICAL DATA: Shortness of breath.

EXAM:
CHEST - 2 VIEW

[chest pa]
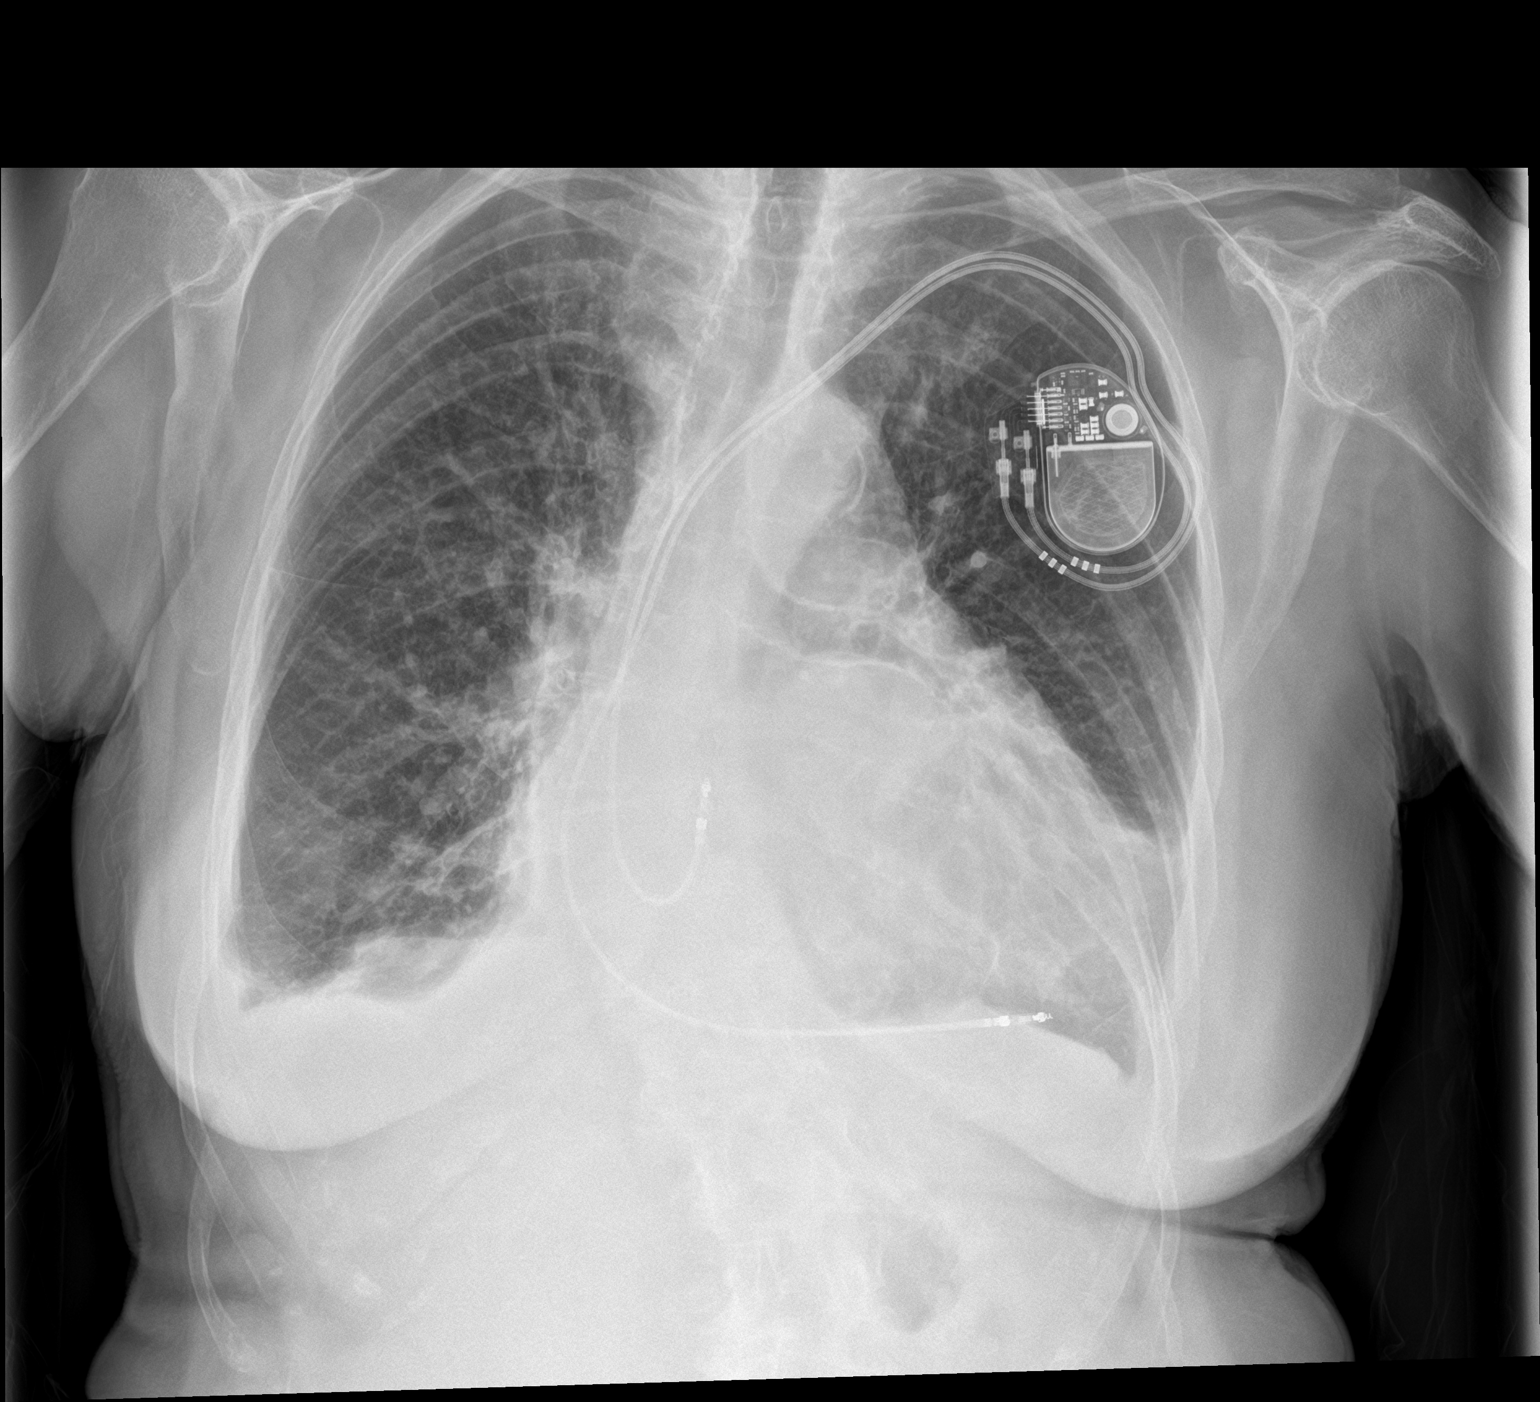

[chest lat]
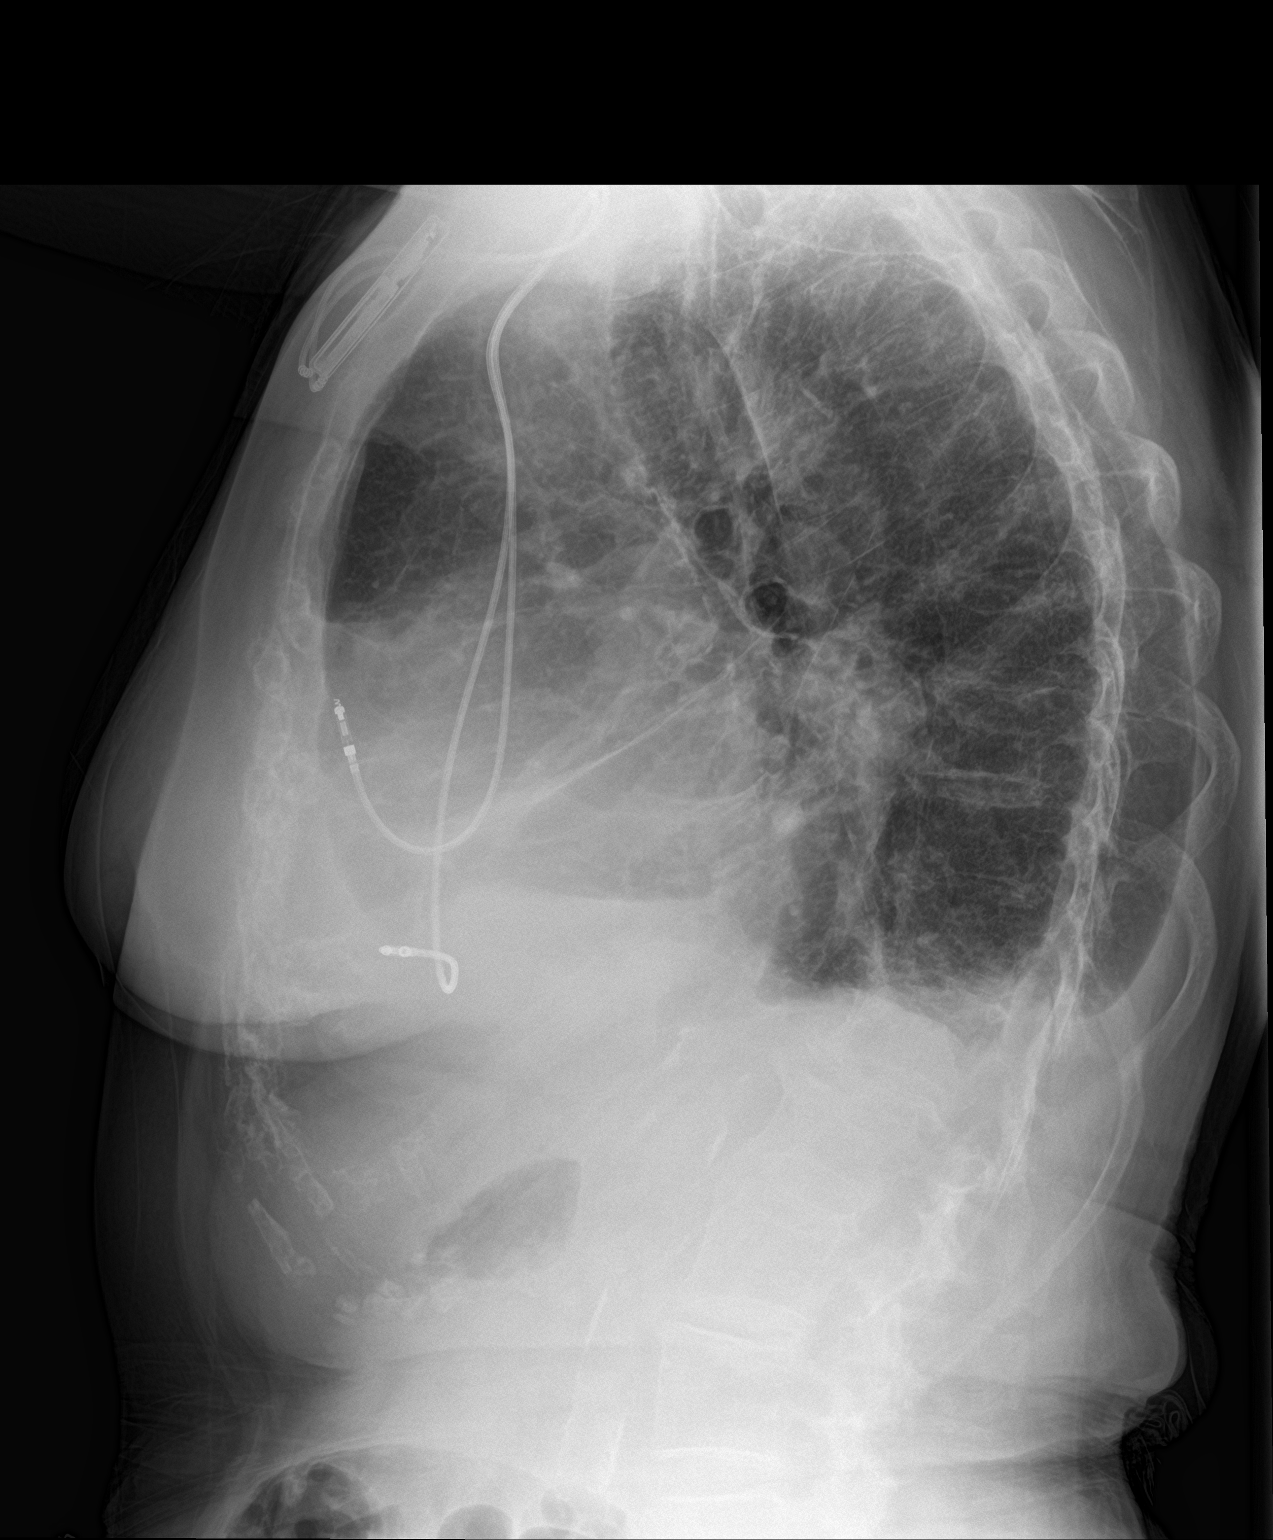

[2 of 2 positions shown; findings below may reference images not displayed]

FINDINGS: Cardiac pacer with lead tips over the right atrium and right
ventricle. Cardiomegaly with mild bilateral interstitial prominence
and right-sided pleural effusion consistent with CHF. No
pneumothorax. Thoracic spine degenerative changes and scoliosis.
IMPRESSION: 1.  Cardiac pacer lead tip over the right atrium right ventricle.

2. Cardiomegaly with pulmonary vascular prominence and bilateral
interstitial prominence of small pleural effusions. Findings
consistent CHF.

## 2018-06-18 ENCOUNTER — Ambulatory Visit (INDEPENDENT_AMBULATORY_CARE_PROVIDER_SITE_OTHER): Payer: Medicare Other | Admitting: *Deleted

## 2018-06-18 DIAGNOSIS — I442 Atrioventricular block, complete: Secondary | ICD-10-CM | POA: Diagnosis not present

## 2018-06-18 LAB — CUP PACEART REMOTE DEVICE CHECK
Battery Remaining Longevity: 112 mo
Battery Remaining Percentage: 95.5 %
Battery Voltage: 3.01 V
Brady Statistic AP VP Percent: 65 %
Brady Statistic AP VS Percent: 1 %
Brady Statistic AS VP Percent: 35 %
Brady Statistic AS VS Percent: 1 %
Brady Statistic RA Percent Paced: 68 %
Brady Statistic RV Percent Paced: 99 %
Date Time Interrogation Session: 20200610060012
Implantable Lead Implant Date: 20190524
Implantable Lead Implant Date: 20190524
Implantable Lead Location: 753859
Implantable Lead Location: 753860
Implantable Pulse Generator Implant Date: 20190524
Lead Channel Impedance Value: 430 Ohm
Lead Channel Impedance Value: 640 Ohm
Lead Channel Pacing Threshold Amplitude: 0.75 V
Lead Channel Pacing Threshold Amplitude: 0.75 V
Lead Channel Pacing Threshold Pulse Width: 0.5 ms
Lead Channel Pacing Threshold Pulse Width: 0.5 ms
Lead Channel Sensing Intrinsic Amplitude: 1.9 mV
Lead Channel Sensing Intrinsic Amplitude: 12 mV
Lead Channel Setting Pacing Amplitude: 1 V
Lead Channel Setting Pacing Amplitude: 2 V
Lead Channel Setting Pacing Pulse Width: 0.5 ms
Lead Channel Setting Sensing Sensitivity: 2 mV
Pulse Gen Model: 2272
Pulse Gen Serial Number: 9022503

## 2018-06-27 ENCOUNTER — Encounter: Payer: Self-pay | Admitting: Cardiology

## 2018-06-27 NOTE — Progress Notes (Signed)
Remote pacemaker transmission.   

## 2018-07-03 DIAGNOSIS — M79674 Pain in right toe(s): Secondary | ICD-10-CM | POA: Diagnosis not present

## 2018-07-03 DIAGNOSIS — L600 Ingrowing nail: Secondary | ICD-10-CM | POA: Diagnosis not present

## 2018-09-10 DIAGNOSIS — L600 Ingrowing nail: Secondary | ICD-10-CM | POA: Diagnosis not present

## 2018-09-10 DIAGNOSIS — M79674 Pain in right toe(s): Secondary | ICD-10-CM | POA: Diagnosis not present

## 2018-09-10 DIAGNOSIS — M79675 Pain in left toe(s): Secondary | ICD-10-CM | POA: Diagnosis not present

## 2018-09-17 ENCOUNTER — Ambulatory Visit (INDEPENDENT_AMBULATORY_CARE_PROVIDER_SITE_OTHER): Payer: Medicare Other | Admitting: *Deleted

## 2018-09-17 DIAGNOSIS — I4821 Permanent atrial fibrillation: Secondary | ICD-10-CM

## 2018-09-17 DIAGNOSIS — I442 Atrioventricular block, complete: Secondary | ICD-10-CM

## 2018-09-17 LAB — CUP PACEART REMOTE DEVICE CHECK
Battery Remaining Longevity: 110 mo
Battery Remaining Percentage: 95.5 %
Battery Voltage: 3.01 V
Brady Statistic AP VP Percent: 73 %
Brady Statistic AP VS Percent: 1 %
Brady Statistic AS VP Percent: 26 %
Brady Statistic AS VS Percent: 1 %
Brady Statistic RA Percent Paced: 76 %
Brady Statistic RV Percent Paced: 99 %
Date Time Interrogation Session: 20200909060013
Implantable Lead Implant Date: 20190524
Implantable Lead Implant Date: 20190524
Implantable Lead Location: 753859
Implantable Lead Location: 753860
Implantable Pulse Generator Implant Date: 20190524
Lead Channel Impedance Value: 430 Ohm
Lead Channel Impedance Value: 630 Ohm
Lead Channel Pacing Threshold Amplitude: 0.625 V
Lead Channel Pacing Threshold Amplitude: 0.75 V
Lead Channel Pacing Threshold Pulse Width: 0.5 ms
Lead Channel Pacing Threshold Pulse Width: 0.5 ms
Lead Channel Sensing Intrinsic Amplitude: 12 mV
Lead Channel Sensing Intrinsic Amplitude: 2.6 mV
Lead Channel Setting Pacing Amplitude: 0.875
Lead Channel Setting Pacing Amplitude: 2 V
Lead Channel Setting Pacing Pulse Width: 0.5 ms
Lead Channel Setting Sensing Sensitivity: 2 mV
Pulse Gen Model: 2272
Pulse Gen Serial Number: 9022503

## 2018-09-20 DIAGNOSIS — H579 Unspecified disorder of eye and adnexa: Secondary | ICD-10-CM | POA: Diagnosis not present

## 2018-10-02 NOTE — Progress Notes (Signed)
Remote pacemaker transmission.   

## 2018-10-13 DIAGNOSIS — Z23 Encounter for immunization: Secondary | ICD-10-CM | POA: Diagnosis not present

## 2018-10-15 DIAGNOSIS — Z03818 Encounter for observation for suspected exposure to other biological agents ruled out: Secondary | ICD-10-CM | POA: Diagnosis not present

## 2018-10-22 DIAGNOSIS — Z03818 Encounter for observation for suspected exposure to other biological agents ruled out: Secondary | ICD-10-CM | POA: Diagnosis not present

## 2018-11-06 DIAGNOSIS — Z03818 Encounter for observation for suspected exposure to other biological agents ruled out: Secondary | ICD-10-CM | POA: Diagnosis not present

## 2018-11-12 DIAGNOSIS — Z03818 Encounter for observation for suspected exposure to other biological agents ruled out: Secondary | ICD-10-CM | POA: Diagnosis not present

## 2018-11-18 DIAGNOSIS — M79675 Pain in left toe(s): Secondary | ICD-10-CM | POA: Diagnosis not present

## 2018-11-18 DIAGNOSIS — B351 Tinea unguium: Secondary | ICD-10-CM | POA: Diagnosis not present

## 2018-11-19 DIAGNOSIS — Z03818 Encounter for observation for suspected exposure to other biological agents ruled out: Secondary | ICD-10-CM | POA: Diagnosis not present

## 2018-12-17 ENCOUNTER — Ambulatory Visit (INDEPENDENT_AMBULATORY_CARE_PROVIDER_SITE_OTHER): Payer: Medicare Other | Admitting: *Deleted

## 2018-12-17 DIAGNOSIS — I442 Atrioventricular block, complete: Secondary | ICD-10-CM

## 2018-12-17 LAB — CUP PACEART REMOTE DEVICE CHECK
Battery Remaining Longevity: 110 mo
Battery Remaining Percentage: 95.5 %
Battery Voltage: 3.01 V
Brady Statistic AP VP Percent: 79 %
Brady Statistic AP VS Percent: 1 %
Brady Statistic AS VP Percent: 21 %
Brady Statistic AS VS Percent: 1 %
Brady Statistic RA Percent Paced: 81 %
Brady Statistic RV Percent Paced: 99 %
Date Time Interrogation Session: 20201209020014
Implantable Lead Implant Date: 20190524
Implantable Lead Implant Date: 20190524
Implantable Lead Location: 753859
Implantable Lead Location: 753860
Implantable Pulse Generator Implant Date: 20190524
Lead Channel Impedance Value: 410 Ohm
Lead Channel Impedance Value: 560 Ohm
Lead Channel Pacing Threshold Amplitude: 0.625 V
Lead Channel Pacing Threshold Amplitude: 0.75 V
Lead Channel Pacing Threshold Pulse Width: 0.5 ms
Lead Channel Pacing Threshold Pulse Width: 0.5 ms
Lead Channel Sensing Intrinsic Amplitude: 1 mV
Lead Channel Sensing Intrinsic Amplitude: 12 mV
Lead Channel Setting Pacing Amplitude: 0.875
Lead Channel Setting Pacing Amplitude: 2 V
Lead Channel Setting Pacing Pulse Width: 0.5 ms
Lead Channel Setting Sensing Sensitivity: 2 mV
Pulse Gen Model: 2272
Pulse Gen Serial Number: 9022503

## 2018-12-18 DIAGNOSIS — Z03818 Encounter for observation for suspected exposure to other biological agents ruled out: Secondary | ICD-10-CM | POA: Diagnosis not present

## 2018-12-30 DIAGNOSIS — Z03818 Encounter for observation for suspected exposure to other biological agents ruled out: Secondary | ICD-10-CM | POA: Diagnosis not present

## 2019-01-06 DIAGNOSIS — Z20828 Contact with and (suspected) exposure to other viral communicable diseases: Secondary | ICD-10-CM | POA: Diagnosis not present

## 2019-01-13 DIAGNOSIS — Z23 Encounter for immunization: Secondary | ICD-10-CM | POA: Diagnosis not present

## 2019-01-24 NOTE — Progress Notes (Signed)
PPM remote 

## 2019-01-28 DIAGNOSIS — Z20828 Contact with and (suspected) exposure to other viral communicable diseases: Secondary | ICD-10-CM | POA: Diagnosis not present

## 2019-02-04 DIAGNOSIS — Z03818 Encounter for observation for suspected exposure to other biological agents ruled out: Secondary | ICD-10-CM | POA: Diagnosis not present

## 2019-02-10 DIAGNOSIS — Z23 Encounter for immunization: Secondary | ICD-10-CM | POA: Diagnosis not present

## 2019-02-11 DIAGNOSIS — Z03818 Encounter for observation for suspected exposure to other biological agents ruled out: Secondary | ICD-10-CM | POA: Diagnosis not present

## 2019-02-16 DIAGNOSIS — Z03818 Encounter for observation for suspected exposure to other biological agents ruled out: Secondary | ICD-10-CM | POA: Diagnosis not present

## 2019-02-18 DIAGNOSIS — Z03818 Encounter for observation for suspected exposure to other biological agents ruled out: Secondary | ICD-10-CM | POA: Diagnosis not present

## 2019-02-23 DIAGNOSIS — Z03818 Encounter for observation for suspected exposure to other biological agents ruled out: Secondary | ICD-10-CM | POA: Diagnosis not present

## 2019-02-25 DIAGNOSIS — Z03818 Encounter for observation for suspected exposure to other biological agents ruled out: Secondary | ICD-10-CM | POA: Diagnosis not present

## 2019-02-26 DIAGNOSIS — F0390 Unspecified dementia without behavioral disturbance: Secondary | ICD-10-CM | POA: Diagnosis not present

## 2019-02-26 DIAGNOSIS — F411 Generalized anxiety disorder: Secondary | ICD-10-CM | POA: Diagnosis not present

## 2019-02-27 DIAGNOSIS — I509 Heart failure, unspecified: Secondary | ICD-10-CM | POA: Diagnosis not present

## 2019-02-27 DIAGNOSIS — E785 Hyperlipidemia, unspecified: Secondary | ICD-10-CM | POA: Diagnosis not present

## 2019-02-27 DIAGNOSIS — R609 Edema, unspecified: Secondary | ICD-10-CM | POA: Diagnosis not present

## 2019-02-27 DIAGNOSIS — F339 Major depressive disorder, recurrent, unspecified: Secondary | ICD-10-CM | POA: Diagnosis not present

## 2019-02-27 DIAGNOSIS — G3184 Mild cognitive impairment, so stated: Secondary | ICD-10-CM | POA: Diagnosis not present

## 2019-02-27 DIAGNOSIS — F411 Generalized anxiety disorder: Secondary | ICD-10-CM | POA: Diagnosis not present

## 2019-02-27 DIAGNOSIS — I10 Essential (primary) hypertension: Secondary | ICD-10-CM | POA: Diagnosis not present

## 2019-03-02 DIAGNOSIS — Z03818 Encounter for observation for suspected exposure to other biological agents ruled out: Secondary | ICD-10-CM | POA: Diagnosis not present

## 2019-03-04 DIAGNOSIS — Z03818 Encounter for observation for suspected exposure to other biological agents ruled out: Secondary | ICD-10-CM | POA: Diagnosis not present

## 2019-03-04 DIAGNOSIS — B351 Tinea unguium: Secondary | ICD-10-CM | POA: Diagnosis not present

## 2019-03-04 DIAGNOSIS — M79675 Pain in left toe(s): Secondary | ICD-10-CM | POA: Diagnosis not present

## 2019-03-18 ENCOUNTER — Ambulatory Visit (INDEPENDENT_AMBULATORY_CARE_PROVIDER_SITE_OTHER): Payer: Medicare Other | Admitting: *Deleted

## 2019-03-18 DIAGNOSIS — I442 Atrioventricular block, complete: Secondary | ICD-10-CM

## 2019-03-18 LAB — CUP PACEART REMOTE DEVICE CHECK
Battery Remaining Longevity: 109 mo
Battery Remaining Percentage: 95.5 %
Battery Voltage: 3.01 V
Brady Statistic AP VP Percent: 82 %
Brady Statistic AP VS Percent: 1 %
Brady Statistic AS VP Percent: 18 %
Brady Statistic AS VS Percent: 1 %
Brady Statistic RA Percent Paced: 84 %
Brady Statistic RV Percent Paced: 99 %
Date Time Interrogation Session: 20210310032355
Implantable Lead Implant Date: 20190524
Implantable Lead Implant Date: 20190524
Implantable Lead Location: 753859
Implantable Lead Location: 753860
Implantable Pulse Generator Implant Date: 20190524
Lead Channel Impedance Value: 400 Ohm
Lead Channel Impedance Value: 560 Ohm
Lead Channel Pacing Threshold Amplitude: 0.625 V
Lead Channel Pacing Threshold Amplitude: 0.75 V
Lead Channel Pacing Threshold Pulse Width: 0.5 ms
Lead Channel Pacing Threshold Pulse Width: 0.5 ms
Lead Channel Sensing Intrinsic Amplitude: 1 mV
Lead Channel Sensing Intrinsic Amplitude: 12 mV
Lead Channel Setting Pacing Amplitude: 0.875
Lead Channel Setting Pacing Amplitude: 2 V
Lead Channel Setting Pacing Pulse Width: 0.5 ms
Lead Channel Setting Sensing Sensitivity: 2 mV
Pulse Gen Model: 2272
Pulse Gen Serial Number: 9022503

## 2019-03-19 NOTE — Progress Notes (Signed)
PPM Remote  

## 2019-03-20 DIAGNOSIS — F411 Generalized anxiety disorder: Secondary | ICD-10-CM | POA: Diagnosis not present

## 2019-03-20 DIAGNOSIS — G3184 Mild cognitive impairment, so stated: Secondary | ICD-10-CM | POA: Diagnosis not present

## 2019-03-20 DIAGNOSIS — F339 Major depressive disorder, recurrent, unspecified: Secondary | ICD-10-CM | POA: Diagnosis not present

## 2019-04-01 DIAGNOSIS — R079 Chest pain, unspecified: Secondary | ICD-10-CM | POA: Diagnosis not present

## 2019-04-08 DIAGNOSIS — S2341XA Sprain of ribs, initial encounter: Secondary | ICD-10-CM | POA: Diagnosis not present

## 2019-04-08 DIAGNOSIS — I10 Essential (primary) hypertension: Secondary | ICD-10-CM | POA: Diagnosis not present

## 2019-04-08 DIAGNOSIS — R296 Repeated falls: Secondary | ICD-10-CM | POA: Diagnosis not present

## 2019-04-17 DIAGNOSIS — F0391 Unspecified dementia with behavioral disturbance: Secondary | ICD-10-CM | POA: Diagnosis not present

## 2019-04-17 DIAGNOSIS — F339 Major depressive disorder, recurrent, unspecified: Secondary | ICD-10-CM | POA: Diagnosis not present

## 2019-04-17 DIAGNOSIS — F411 Generalized anxiety disorder: Secondary | ICD-10-CM | POA: Diagnosis not present

## 2019-04-29 DIAGNOSIS — I5042 Chronic combined systolic (congestive) and diastolic (congestive) heart failure: Secondary | ICD-10-CM | POA: Diagnosis not present

## 2019-04-29 DIAGNOSIS — F339 Major depressive disorder, recurrent, unspecified: Secondary | ICD-10-CM | POA: Diagnosis not present

## 2019-04-29 DIAGNOSIS — I10 Essential (primary) hypertension: Secondary | ICD-10-CM | POA: Diagnosis not present

## 2019-05-11 DIAGNOSIS — G301 Alzheimer's disease with late onset: Secondary | ICD-10-CM | POA: Diagnosis not present

## 2019-05-11 DIAGNOSIS — F0281 Dementia in other diseases classified elsewhere with behavioral disturbance: Secondary | ICD-10-CM | POA: Diagnosis not present

## 2019-05-11 DIAGNOSIS — F0631 Mood disorder due to known physiological condition with depressive features: Secondary | ICD-10-CM | POA: Diagnosis not present

## 2019-05-12 DIAGNOSIS — M79674 Pain in right toe(s): Secondary | ICD-10-CM | POA: Diagnosis not present

## 2019-05-12 DIAGNOSIS — L600 Ingrowing nail: Secondary | ICD-10-CM | POA: Diagnosis not present

## 2019-05-12 DIAGNOSIS — M79675 Pain in left toe(s): Secondary | ICD-10-CM | POA: Diagnosis not present

## 2019-05-13 DIAGNOSIS — F339 Major depressive disorder, recurrent, unspecified: Secondary | ICD-10-CM | POA: Diagnosis not present

## 2019-05-13 DIAGNOSIS — F0281 Dementia in other diseases classified elsewhere with behavioral disturbance: Secondary | ICD-10-CM | POA: Diagnosis not present

## 2019-05-13 DIAGNOSIS — G301 Alzheimer's disease with late onset: Secondary | ICD-10-CM | POA: Diagnosis not present

## 2019-06-02 DIAGNOSIS — G301 Alzheimer's disease with late onset: Secondary | ICD-10-CM | POA: Diagnosis not present

## 2019-06-02 DIAGNOSIS — F411 Generalized anxiety disorder: Secondary | ICD-10-CM | POA: Diagnosis not present

## 2019-06-03 DIAGNOSIS — F0281 Dementia in other diseases classified elsewhere with behavioral disturbance: Secondary | ICD-10-CM | POA: Diagnosis not present

## 2019-06-03 DIAGNOSIS — F411 Generalized anxiety disorder: Secondary | ICD-10-CM | POA: Diagnosis not present

## 2019-06-03 DIAGNOSIS — I10 Essential (primary) hypertension: Secondary | ICD-10-CM | POA: Diagnosis not present

## 2019-06-10 DIAGNOSIS — F339 Major depressive disorder, recurrent, unspecified: Secondary | ICD-10-CM | POA: Diagnosis not present

## 2019-06-10 DIAGNOSIS — G301 Alzheimer's disease with late onset: Secondary | ICD-10-CM | POA: Diagnosis not present

## 2019-06-10 DIAGNOSIS — F0281 Dementia in other diseases classified elsewhere with behavioral disturbance: Secondary | ICD-10-CM | POA: Diagnosis not present

## 2019-06-10 DIAGNOSIS — F411 Generalized anxiety disorder: Secondary | ICD-10-CM | POA: Diagnosis not present

## 2019-06-16 DIAGNOSIS — E559 Vitamin D deficiency, unspecified: Secondary | ICD-10-CM | POA: Diagnosis not present

## 2019-06-16 DIAGNOSIS — I10 Essential (primary) hypertension: Secondary | ICD-10-CM | POA: Diagnosis not present

## 2019-06-17 ENCOUNTER — Ambulatory Visit (INDEPENDENT_AMBULATORY_CARE_PROVIDER_SITE_OTHER): Payer: Medicare Other | Admitting: *Deleted

## 2019-06-17 DIAGNOSIS — R001 Bradycardia, unspecified: Secondary | ICD-10-CM | POA: Diagnosis not present

## 2019-06-17 LAB — CUP PACEART REMOTE DEVICE CHECK
Battery Remaining Longevity: 110 mo
Battery Remaining Percentage: 95.5 %
Battery Voltage: 3.01 V
Brady Statistic AP VP Percent: 85 %
Brady Statistic AP VS Percent: 1 %
Brady Statistic AS VP Percent: 15 %
Brady Statistic AS VS Percent: 1 %
Brady Statistic RA Percent Paced: 86 %
Brady Statistic RV Percent Paced: 99 %
Date Time Interrogation Session: 20210609020018
Implantable Lead Implant Date: 20190524
Implantable Lead Implant Date: 20190524
Implantable Lead Location: 753859
Implantable Lead Location: 753860
Implantable Pulse Generator Implant Date: 20190524
Lead Channel Impedance Value: 430 Ohm
Lead Channel Impedance Value: 540 Ohm
Lead Channel Pacing Threshold Amplitude: 0.5 V
Lead Channel Pacing Threshold Amplitude: 0.75 V
Lead Channel Pacing Threshold Pulse Width: 0.5 ms
Lead Channel Pacing Threshold Pulse Width: 0.5 ms
Lead Channel Sensing Intrinsic Amplitude: 1 mV
Lead Channel Sensing Intrinsic Amplitude: 12 mV
Lead Channel Setting Pacing Amplitude: 0.75 V
Lead Channel Setting Pacing Amplitude: 2 V
Lead Channel Setting Pacing Pulse Width: 0.5 ms
Lead Channel Setting Sensing Sensitivity: 2 mV
Pulse Gen Model: 2272
Pulse Gen Serial Number: 9022503

## 2019-06-18 NOTE — Progress Notes (Signed)
Remote pacemaker transmission.   

## 2019-07-21 DIAGNOSIS — M79675 Pain in left toe(s): Secondary | ICD-10-CM | POA: Diagnosis not present

## 2019-07-21 DIAGNOSIS — L600 Ingrowing nail: Secondary | ICD-10-CM | POA: Diagnosis not present

## 2019-07-21 DIAGNOSIS — M79674 Pain in right toe(s): Secondary | ICD-10-CM | POA: Diagnosis not present

## 2019-07-29 DIAGNOSIS — R609 Edema, unspecified: Secondary | ICD-10-CM | POA: Diagnosis not present

## 2019-07-29 DIAGNOSIS — E876 Hypokalemia: Secondary | ICD-10-CM | POA: Diagnosis not present

## 2019-07-29 DIAGNOSIS — I10 Essential (primary) hypertension: Secondary | ICD-10-CM | POA: Diagnosis not present

## 2019-08-12 DIAGNOSIS — G301 Alzheimer's disease with late onset: Secondary | ICD-10-CM | POA: Diagnosis not present

## 2019-08-12 DIAGNOSIS — F411 Generalized anxiety disorder: Secondary | ICD-10-CM | POA: Diagnosis not present

## 2019-08-26 DIAGNOSIS — I5040 Unspecified combined systolic (congestive) and diastolic (congestive) heart failure: Secondary | ICD-10-CM | POA: Diagnosis not present

## 2019-08-26 DIAGNOSIS — R600 Localized edema: Secondary | ICD-10-CM | POA: Diagnosis not present

## 2019-08-26 DIAGNOSIS — I10 Essential (primary) hypertension: Secondary | ICD-10-CM | POA: Diagnosis not present

## 2019-09-16 ENCOUNTER — Ambulatory Visit (INDEPENDENT_AMBULATORY_CARE_PROVIDER_SITE_OTHER): Payer: Medicare Other | Admitting: *Deleted

## 2019-09-16 DIAGNOSIS — R001 Bradycardia, unspecified: Secondary | ICD-10-CM

## 2019-09-16 LAB — CUP PACEART REMOTE DEVICE CHECK
Battery Remaining Longevity: 110 mo
Battery Remaining Percentage: 95.5 %
Battery Voltage: 3.01 V
Brady Statistic AP VP Percent: 87 %
Brady Statistic AP VS Percent: 1 %
Brady Statistic AS VP Percent: 13 %
Brady Statistic AS VS Percent: 1 %
Brady Statistic RA Percent Paced: 88 %
Brady Statistic RV Percent Paced: 99 %
Date Time Interrogation Session: 20210908020017
Implantable Lead Implant Date: 20190524
Implantable Lead Implant Date: 20190524
Implantable Lead Location: 753859
Implantable Lead Location: 753860
Implantable Pulse Generator Implant Date: 20190524
Lead Channel Impedance Value: 430 Ohm
Lead Channel Impedance Value: 530 Ohm
Lead Channel Pacing Threshold Amplitude: 0.5 V
Lead Channel Pacing Threshold Amplitude: 0.75 V
Lead Channel Pacing Threshold Pulse Width: 0.5 ms
Lead Channel Pacing Threshold Pulse Width: 0.5 ms
Lead Channel Sensing Intrinsic Amplitude: 1 mV
Lead Channel Sensing Intrinsic Amplitude: 12 mV
Lead Channel Setting Pacing Amplitude: 0.75 V
Lead Channel Setting Pacing Amplitude: 2 V
Lead Channel Setting Pacing Pulse Width: 0.5 ms
Lead Channel Setting Sensing Sensitivity: 2 mV
Pulse Gen Model: 2272
Pulse Gen Serial Number: 9022503

## 2019-09-17 DIAGNOSIS — B078 Other viral warts: Secondary | ICD-10-CM | POA: Diagnosis not present

## 2019-09-17 DIAGNOSIS — I781 Nevus, non-neoplastic: Secondary | ICD-10-CM | POA: Diagnosis not present

## 2019-09-17 DIAGNOSIS — D225 Melanocytic nevi of trunk: Secondary | ICD-10-CM | POA: Diagnosis not present

## 2019-09-17 DIAGNOSIS — D485 Neoplasm of uncertain behavior of skin: Secondary | ICD-10-CM | POA: Diagnosis not present

## 2019-09-17 DIAGNOSIS — L820 Inflamed seborrheic keratosis: Secondary | ICD-10-CM | POA: Diagnosis not present

## 2019-09-17 NOTE — Progress Notes (Signed)
Remote pacemaker transmission.   

## 2019-10-06 DIAGNOSIS — E782 Mixed hyperlipidemia: Secondary | ICD-10-CM | POA: Diagnosis not present

## 2019-10-06 DIAGNOSIS — E876 Hypokalemia: Secondary | ICD-10-CM | POA: Diagnosis not present

## 2019-10-06 DIAGNOSIS — I10 Essential (primary) hypertension: Secondary | ICD-10-CM | POA: Diagnosis not present

## 2019-10-06 DIAGNOSIS — Z7901 Long term (current) use of anticoagulants: Secondary | ICD-10-CM | POA: Diagnosis not present

## 2019-10-07 DIAGNOSIS — Z23 Encounter for immunization: Secondary | ICD-10-CM | POA: Diagnosis not present

## 2019-10-08 ENCOUNTER — Encounter: Payer: Self-pay | Admitting: Internal Medicine

## 2019-10-08 ENCOUNTER — Ambulatory Visit (INDEPENDENT_AMBULATORY_CARE_PROVIDER_SITE_OTHER): Payer: Medicare Other | Admitting: Internal Medicine

## 2019-10-08 ENCOUNTER — Telehealth: Payer: Self-pay | Admitting: Internal Medicine

## 2019-10-08 ENCOUNTER — Other Ambulatory Visit: Payer: Self-pay

## 2019-10-08 VITALS — BP 104/72 | HR 75 | Ht 63.0 in | Wt 142.6 lb

## 2019-10-08 DIAGNOSIS — I4821 Permanent atrial fibrillation: Secondary | ICD-10-CM

## 2019-10-08 NOTE — Progress Notes (Signed)
HPI Renee Cameron returns today for ongoing evaluation and management of her CHB, sinus node dysfunction, s/p PPM insertion. She has some dementia and is on namenda. SHe denies chest pain or sob. She has been less active during covid.  No Known Allergies   Current Outpatient Medications  Medication Sig Dispense Refill  . ALPRAZolam (XANAX) 0.25 MG tablet Take 0.25 mg by mouth 2 (two) times daily as needed.    . desvenlafaxine (PRISTIQ) 50 MG 24 hr tablet Take 1 tablet by mouth daily.    Marland Kitchen donepezil (ARICEPT) 5 MG tablet Take 5 mg by mouth at bedtime.  2  . furosemide (LASIX) 20 MG tablet Take 20 mg by mouth daily.    Marland Kitchen lisinopril (ZESTRIL) 5 MG tablet Take 5 mg by mouth daily.    Marland Kitchen MEGARED OMEGA-3 KRILL OIL 500 MG CAPS Take 500 mg by mouth daily at 12 noon.    . memantine (NAMENDA) 10 MG tablet Take 1 tablet by mouth daily.    . metoprolol succinate (TOPROL-XL) 25 MG 24 hr tablet TAKE 1 TABLET BY MOUTH EVERY DAY 90 tablet 3  . Multiple Vitamins-Minerals (PRESERVISION AREDS 2+MULTI VIT) CAPS Take 2 capsules by mouth daily.    . potassium chloride (K-DUR,KLOR-CON) 10 MEQ tablet Take 1 tablet by mouth daily.    Renee Cameron 15 MG TABS tablet TAKE 1 TABLET(15 MG) BY MOUTH DAILY WITH DINNER 30 tablet 6   No current facility-administered medications for this visit.     Past Medical History:  Diagnosis Date  . Chronic anticoagulation   . Complete heart block ([place]) 05/2017  . Complete heart block ([place])    a. s/p STJ PPM 05/2017.  Marland Kitchen Dementia ([place])   . Hypertension    unspecified  . Hyperthyroidism 2000   2000  . Osteoporosis   . Permanent atrial fibrillation ([place]) 1993   permanent; onset in 1993    ROS:   All systems reviewed and negative except as noted in the HPI.   Past Surgical History:  Procedure Laterality Date  . ABDOMINAL HYSTERECTOMY    . APPENDECTOMY    . COLONOSCOPY  2005   Dr. Laural Golden: few small diverticula. external hemorrhoids  . PACEMAKER IMPLANT N/A  05/31/2017   Procedure: PACEMAKER IMPLANT;  Surgeon: Evans Lance, MD;  Location: [facility];  Service: Cardiovascular;  Laterality: N/A;  . TOTAL ABDOMINAL HYSTERECTOMY W/ BILATERAL SALPINGOOPHORECTOMY       Family History  Problem Relation Age of Onset  . Prostate cancer Father   . Stroke Father   . Heart disease Mother   . Coronary artery disease Sister        with prior CABG surgery     Social History   Socioeconomic History  . Marital status: Widowed    Spouse name: Not on file  . Number of children: Not on file  . Years of education: Not on file  . Highest education level: Not on file  Occupational History  . Occupation: Retired    Comment: [employer]  Tobacco Use  . Smoking status: Never Smoker  . Smokeless tobacco: Never Used  Vaping Use  . Vaping Use: Never used  Substance and Sexual Activity  . Alcohol use: No    Alcohol/week: 0.0 standard drinks  . Drug use: No  . Sexual activity: Not on file  Other Topics Concern  . Not on file  Social History Narrative   Volunteers 2 days weekly  Social Determinants of Health   Financial Resource Strain:   . Difficulty of Paying Living Expenses: Not on file  Food Insecurity:   . Worried About Charity fundraiser in the Last Year: Not on file  . Ran Out of Food in the Last Year: Not on file  Transportation Needs:   . Lack of Transportation (Medical): Not on file  . Lack of Transportation (Non-Medical): Not on file  Physical Activity:   . Days of Exercise per Week: Not on file  . Minutes of Exercise per Session: Not on file  Stress:   . Feeling of Stress : Not on file  Social Connections:   . Frequency of Communication with Friends and Family: Not on file  . Frequency of Social Gatherings with Friends and Family: Not on file  . Attends Religious Services: Not on file  . Active Member of Clubs or Organizations: Not on file  . Attends Archivist Meetings: Not on file  . Marital  Status: Not on file  Intimate Partner Violence:   . Fear of Current or Ex-Partner: Not on file  . Emotionally Abused: Not on file  . Physically Abused: Not on file  . Sexually Abused: Not on file     BP 104/72   Pulse 75   Ht 5\' 3"  (1.6 m)   Wt 142 lb 9.6 oz (64.7 kg)   SpO2 95%   BMI 25.26 kg/m   Physical Exam:  Well appearing elderly woman, NAD HEENT: Unremarkable Neck:  6 cm JVD, no thyromegally Lymphatics:  No adenopathy Back:  No CVA tenderness Lungs:  Clear with no wheezes HEART:  Regular rate rhythm, no murmurs, no rubs, no clicks Abd:  soft, positive bowel sounds, no organomegally, no rebound, no guarding Ext:  2 plus pulses, no edema, no cyanosis, no clubbing Skin:  No rashes no nodules Neuro:  CN II through XII intact, motor grossly intact  EKG - NSR with atrial pacing and ventricular pacing  DEVICE  Normal device function.  See PaceArt for details.   Assess/Plan: 1. CHB - she is asymptomatic, s/p PPM insertion. 2. PPM - her St. Jude DDD PM is working normally.  3. Sinus node dysfunction - she is asymptomatic. She has a junctional escape at 40/min. 4. HTN - her bp is well controlled. No change in her meds.  Carleene Overlie Janette Harvie,MD

## 2019-10-08 NOTE — Telephone Encounter (Signed)
Sharyn Lull, Nurse from [facility] in [place] wanted to confirm the patient's medications. The patient had a visit with Dr. Lovena Le today and she wants to make sure all providers and the [place] are on the same page. Please call

## 2019-10-08 NOTE — Patient Instructions (Signed)
Medication Instructions:  Your physician recommends that you continue on your current medications as directed. Please refer to the Current Medication list given to you today.  *If you need a refill on your cardiac medications before your next appointment, please call your pharmacy*   Lab Work: NONE   If you have labs (blood work) drawn today and your tests are completely normal, you will receive your results only by: . MyChart Message (if you have MyChart) OR . A paper copy in the mail If you have any lab test that is abnormal or we need to change your treatment, we will call you to review the results.   Testing/Procedures: NONE    Follow-Up: At [facility], you and your health needs are our priority.  As part of our continuing mission to provide you with exceptional heart care, we have created designated Provider Care Teams.  These Care Teams include your primary Cardiologist (physician) and Advanced Practice Providers (APPs -  Physician Assistants and Nurse Practitioners) who all work together to provide you with the care you need, when you need it.  We recommend signing up for the patient portal called "MyChart".  Sign up information is provided on this After Visit Summary.  MyChart is used to connect with patients for Virtual Visits (Telemedicine).  Patients are able to view lab/test results, encounter notes, upcoming appointments, etc.  Non-urgent messages can be sent to your provider as well.   To learn more about what you can do with MyChart, go to [email or web address].    Your next appointment:   6 month(s)  The format for your next appointment:   In Person  Provider:   Samuel McDowell, MD   Other Instructions Thank you for choosing [facility]!    

## 2019-10-08 NOTE — Telephone Encounter (Signed)
Spoke with Sharyn Lull who states that pt is on Lipitor 40 mg daily from PCP Earnestine Leys.

## 2019-11-04 DIAGNOSIS — I5040 Unspecified combined systolic (congestive) and diastolic (congestive) heart failure: Secondary | ICD-10-CM | POA: Diagnosis not present

## 2019-11-04 DIAGNOSIS — Z7901 Long term (current) use of anticoagulants: Secondary | ICD-10-CM | POA: Diagnosis not present

## 2019-11-04 DIAGNOSIS — E782 Mixed hyperlipidemia: Secondary | ICD-10-CM | POA: Diagnosis not present

## 2019-11-04 DIAGNOSIS — E876 Hypokalemia: Secondary | ICD-10-CM | POA: Diagnosis not present

## 2019-11-04 DIAGNOSIS — I10 Essential (primary) hypertension: Secondary | ICD-10-CM | POA: Diagnosis not present

## 2019-11-04 DIAGNOSIS — R600 Localized edema: Secondary | ICD-10-CM | POA: Diagnosis not present

## 2019-11-13 DIAGNOSIS — Z23 Encounter for immunization: Secondary | ICD-10-CM | POA: Diagnosis not present

## 2019-12-04 DIAGNOSIS — J069 Acute upper respiratory infection, unspecified: Secondary | ICD-10-CM | POA: Diagnosis not present

## 2019-12-16 ENCOUNTER — Ambulatory Visit (INDEPENDENT_AMBULATORY_CARE_PROVIDER_SITE_OTHER): Payer: Medicare Other

## 2019-12-16 DIAGNOSIS — R059 Cough, unspecified: Secondary | ICD-10-CM | POA: Diagnosis not present

## 2019-12-16 DIAGNOSIS — E785 Hyperlipidemia, unspecified: Secondary | ICD-10-CM | POA: Diagnosis not present

## 2019-12-16 DIAGNOSIS — G301 Alzheimer's disease with late onset: Secondary | ICD-10-CM | POA: Diagnosis not present

## 2019-12-16 DIAGNOSIS — H6122 Impacted cerumen, left ear: Secondary | ICD-10-CM | POA: Diagnosis not present

## 2019-12-16 DIAGNOSIS — I442 Atrioventricular block, complete: Secondary | ICD-10-CM | POA: Diagnosis not present

## 2019-12-16 DIAGNOSIS — H9193 Unspecified hearing loss, bilateral: Secondary | ICD-10-CM | POA: Diagnosis not present

## 2019-12-16 DIAGNOSIS — E876 Hypokalemia: Secondary | ICD-10-CM | POA: Diagnosis not present

## 2019-12-16 DIAGNOSIS — I10 Essential (primary) hypertension: Secondary | ICD-10-CM | POA: Diagnosis not present

## 2019-12-16 DIAGNOSIS — I5042 Chronic combined systolic (congestive) and diastolic (congestive) heart failure: Secondary | ICD-10-CM | POA: Diagnosis not present

## 2019-12-17 LAB — CUP PACEART REMOTE DEVICE CHECK
Battery Remaining Longevity: 107 mo
Battery Remaining Percentage: 95.5 %
Battery Voltage: 3.01 V
Brady Statistic AP VP Percent: 99 %
Brady Statistic AP VS Percent: 1 %
Brady Statistic AS VP Percent: 1 %
Brady Statistic AS VS Percent: 1 %
Brady Statistic RA Percent Paced: 99 %
Brady Statistic RV Percent Paced: 99 %
Date Time Interrogation Session: 20211208020016
Implantable Lead Implant Date: 20190524
Implantable Lead Implant Date: 20190524
Implantable Lead Location: 753859
Implantable Lead Location: 753860
Implantable Pulse Generator Implant Date: 20190524
Lead Channel Impedance Value: 410 Ohm
Lead Channel Impedance Value: 600 Ohm
Lead Channel Pacing Threshold Amplitude: 0.625 V
Lead Channel Pacing Threshold Amplitude: 0.75 V
Lead Channel Pacing Threshold Pulse Width: 0.5 ms
Lead Channel Pacing Threshold Pulse Width: 0.5 ms
Lead Channel Sensing Intrinsic Amplitude: 1.4 mV
Lead Channel Sensing Intrinsic Amplitude: 12 mV
Lead Channel Setting Pacing Amplitude: 0.875
Lead Channel Setting Pacing Amplitude: 2 V
Lead Channel Setting Pacing Pulse Width: 0.5 ms
Lead Channel Setting Sensing Sensitivity: 2 mV
Pulse Gen Model: 2272
Pulse Gen Serial Number: 9022503

## 2019-12-22 DIAGNOSIS — M79674 Pain in right toe(s): Secondary | ICD-10-CM | POA: Diagnosis not present

## 2019-12-22 DIAGNOSIS — M79675 Pain in left toe(s): Secondary | ICD-10-CM | POA: Diagnosis not present

## 2019-12-22 DIAGNOSIS — L600 Ingrowing nail: Secondary | ICD-10-CM | POA: Diagnosis not present

## 2019-12-25 DIAGNOSIS — R059 Cough, unspecified: Secondary | ICD-10-CM | POA: Diagnosis not present

## 2019-12-25 DIAGNOSIS — F0390 Unspecified dementia without behavioral disturbance: Secondary | ICD-10-CM | POA: Diagnosis not present

## 2019-12-25 DIAGNOSIS — E785 Hyperlipidemia, unspecified: Secondary | ICD-10-CM | POA: Diagnosis not present

## 2019-12-25 DIAGNOSIS — I10 Essential (primary) hypertension: Secondary | ICD-10-CM | POA: Diagnosis not present

## 2019-12-25 DIAGNOSIS — I5042 Chronic combined systolic (congestive) and diastolic (congestive) heart failure: Secondary | ICD-10-CM | POA: Diagnosis not present

## 2019-12-29 NOTE — Progress Notes (Signed)
Remote pacemaker transmission.   

## 2020-03-16 ENCOUNTER — Ambulatory Visit (INDEPENDENT_AMBULATORY_CARE_PROVIDER_SITE_OTHER): Payer: Medicare Other

## 2020-03-16 DIAGNOSIS — I442 Atrioventricular block, complete: Secondary | ICD-10-CM

## 2020-03-18 LAB — CUP PACEART REMOTE DEVICE CHECK
Battery Remaining Longevity: 106 mo
Battery Remaining Percentage: 95.5 %
Battery Voltage: 3.01 V
Brady Statistic AP VP Percent: 99 %
Brady Statistic AP VS Percent: 1 %
Brady Statistic AS VP Percent: 1 %
Brady Statistic AS VS Percent: 1 %
Brady Statistic RA Percent Paced: 99 %
Brady Statistic RV Percent Paced: 99 %
Date Time Interrogation Session: 20220309020015
Implantable Lead Implant Date: 20190524
Implantable Lead Implant Date: 20190524
Implantable Lead Location: 753859
Implantable Lead Location: 753860
Implantable Pulse Generator Implant Date: 20190524
Lead Channel Impedance Value: 410 Ohm
Lead Channel Impedance Value: 550 Ohm
Lead Channel Pacing Threshold Amplitude: 0.625 V
Lead Channel Pacing Threshold Amplitude: 0.75 V
Lead Channel Pacing Threshold Pulse Width: 0.5 ms
Lead Channel Pacing Threshold Pulse Width: 0.5 ms
Lead Channel Sensing Intrinsic Amplitude: 1.4 mV
Lead Channel Sensing Intrinsic Amplitude: 12 mV
Lead Channel Setting Pacing Amplitude: 0.875
Lead Channel Setting Pacing Amplitude: 2 V
Lead Channel Setting Pacing Pulse Width: 0.5 ms
Lead Channel Setting Sensing Sensitivity: 2 mV
Pulse Gen Model: 2272
Pulse Gen Serial Number: 9022503

## 2020-03-24 NOTE — Progress Notes (Signed)
Remote pacemaker transmission.   

## 2020-04-14 ENCOUNTER — Other Ambulatory Visit (HOSPITAL_COMMUNITY)
Admission: RE | Admit: 2020-04-14 | Discharge: 2020-04-14 | Disposition: A | Discharge status: Home / Self Care | Payer: Medicare Other | Source: Other Acute Inpatient Hospital | Attending: Family Medicine | Admitting: Family Medicine

## 2020-04-14 DIAGNOSIS — N390 Urinary tract infection, site not specified: Secondary | ICD-10-CM | POA: Insufficient documentation

## 2020-04-14 LAB — URINALYSIS, COMPLETE (UACMP) WITH MICROSCOPIC
Bilirubin Urine: NEGATIVE
Glucose, UA: NEGATIVE mg/dL
Hgb urine dipstick: NEGATIVE
Ketones, ur: NEGATIVE mg/dL
Leukocytes,Ua: NEGATIVE
Nitrite: POSITIVE — AB
Protein, ur: NEGATIVE mg/dL
Specific Gravity, Urine: 1.025 (ref 1.005–1.030)
pH: 5.5 (ref 5.0–8.0)

## 2020-04-16 LAB — URINE CULTURE

## 2020-04-17 LAB — URINE CULTURE: Culture: >=100000 — AB

## 2020-06-15 ENCOUNTER — Ambulatory Visit (INDEPENDENT_AMBULATORY_CARE_PROVIDER_SITE_OTHER): Payer: Medicare Other

## 2020-06-15 DIAGNOSIS — I442 Atrioventricular block, complete: Secondary | ICD-10-CM | POA: Diagnosis not present

## 2020-06-15 LAB — CUP PACEART REMOTE DEVICE CHECK
Battery Remaining Longevity: 107 mo
Battery Remaining Percentage: 95.5 %
Battery Voltage: 3.01 V
Brady Statistic AP VP Percent: 99 %
Brady Statistic AP VS Percent: 1 %
Brady Statistic AS VP Percent: 1 %
Brady Statistic AS VS Percent: 1 %
Brady Statistic RA Percent Paced: 99 %
Brady Statistic RV Percent Paced: 99 %
Date Time Interrogation Session: 20220608020036
Implantable Lead Implant Date: 20190524
Implantable Lead Implant Date: 20190524
Implantable Lead Location: 753859
Implantable Lead Location: 753860
Implantable Pulse Generator Implant Date: 20190524
Lead Channel Impedance Value: 430 Ohm
Lead Channel Impedance Value: 790 Ohm
Lead Channel Pacing Threshold Amplitude: 0.75 V
Lead Channel Pacing Threshold Amplitude: 1 V
Lead Channel Pacing Threshold Pulse Width: 0.5 ms
Lead Channel Pacing Threshold Pulse Width: 0.5 ms
Lead Channel Sensing Intrinsic Amplitude: 1.4 mV
Lead Channel Sensing Intrinsic Amplitude: 12 mV
Lead Channel Setting Pacing Amplitude: 1.25 V
Lead Channel Setting Pacing Amplitude: 2 V
Lead Channel Setting Pacing Pulse Width: 0.5 ms
Lead Channel Setting Sensing Sensitivity: 2 mV
Pulse Gen Model: 2272
Pulse Gen Serial Number: 9022503

## 2020-07-08 NOTE — Progress Notes (Signed)
Remote pacemaker transmission.   

## 2020-07-21 ENCOUNTER — Ambulatory Visit: Payer: Medicare Other | Admitting: Cardiology

## 2020-08-09 ENCOUNTER — Encounter: Payer: Self-pay | Admitting: Cardiology

## 2020-08-09 NOTE — Progress Notes (Signed)
Cardiology Office Note  Date: 08/10/2020   ID: Renee, Cameron 1924/04/19, MRN [id number]  PCP:  Joycelyn Man, FNP  Cardiologist:  Rozann Lesches, MD Electrophysiologist:  Cristopher Peru, MD   Chief Complaint  Patient presents with   Cardiac follow-up     History of Present Illness: Renee Cameron is a 87 y.o. female former patient of Dr. Bronson Ing now presenting to establish follow-up with me.  I reviewed her records and updated the chart.  She was last seen in September 2021 by Dr. Lovena Le.  She is a resident of [facility].  I reviewed her most recent lab work as noted below.  She does not report any sense of palpitations or chest pain, states that she has a good appetite.  No obvious spontaneous bleeding problems.  She has a history of complete heart block with St. Jude pacemaker in place followed by Dr. Lovena Le.  Device interrogation in June revealed normal function.  I reviewed her medications as noted below.  Past Medical History:  Diagnosis Date   Atrial fibrillation [facility])    Chronic anticoagulation    Complete heart block ([place])    a. s/p STJ PPM 05/2017.   Dementia [facility])    Essential hypertension    Hyperthyroidism 2000   2000   Osteoporosis     Past Surgical History:  Procedure Laterality Date   ABDOMINAL HYSTERECTOMY     APPENDECTOMY     COLONOSCOPY  2005   Dr. Laural Golden: few small diverticula. external hemorrhoids   PACEMAKER IMPLANT N/A 05/31/2017   Procedure: PACEMAKER IMPLANT;  Surgeon: Evans Lance, MD;  Location: [facility];  Service: Cardiovascular;  Laterality: N/A;   TOTAL ABDOMINAL HYSTERECTOMY W/ BILATERAL SALPINGOOPHORECTOMY      Current Outpatient Medications  Medication Sig Dispense Refill   ALPRAZolam (XANAX) 0.25 MG tablet Take 0.25 mg by mouth 2 (two) times daily as needed.     atorvastatin (LIPITOR) 40 MG tablet Take 40 mg by mouth daily.     desvenlafaxine (PRISTIQ) 50 MG 24 hr tablet Take 1 tablet by mouth daily.      donepezil (ARICEPT) 5 MG tablet Take 5 mg by mouth at bedtime.  2   furosemide (LASIX) 20 MG tablet Take 20 mg by mouth daily.     losartan (COZAAR) 25 MG tablet Take 25 mg by mouth daily.     MEGARED OMEGA-3 KRILL OIL 500 MG CAPS Take 500 mg by mouth daily at 12 noon.     metoprolol succinate (TOPROL-XL) 25 MG 24 hr tablet TAKE 1 TABLET BY MOUTH EVERY DAY 90 tablet 3   Multiple Vitamins-Minerals (PRESERVISION AREDS 2+MULTI VIT) CAPS Take 2 capsules by mouth daily.     potassium chloride (K-DUR,KLOR-CON) 10 MEQ tablet Take 1 tablet by mouth daily.     XARELTO 15 MG TABS tablet TAKE 1 TABLET(15 MG) BY MOUTH DAILY WITH DINNER 30 tablet 6   lisinopril (ZESTRIL) 5 MG tablet Take 5 mg by mouth daily. (Patient not taking: Reported on 08/10/2020)     memantine (NAMENDA) 10 MG tablet Take 1 tablet by mouth daily. (Patient not taking: Reported on 08/10/2020)     No current facility-administered medications for this visit.   Allergies:  Patient has no known allergies.   ROS: Hearing loss.  No syncope.  Physical Exam: VS:  BP 140/72   Pulse 72   Ht '5\' 5"'$  (1.651 m)   Wt 144 lb 6.4 oz (65.5 kg)  SpO2 96%   BMI 24.03 kg/m , BMI Body mass index is 24.03 kg/m.  Wt Readings from Last 3 Encounters:  08/10/20 144 lb 6.4 oz (65.5 kg)  10/08/19 142 lb 9.6 oz (64.7 kg)  05/30/18 143 lb (64.9 kg)    General: Pleasant elderly woman, appears comfortable at rest. HEENT: Conjunctiva and lids normal, wearing a mask. Neck: Supple, no elevated JVP or carotid bruits, no thyromegaly. Lungs: Clear to auscultation, nonlabored breathing at rest. Cardiac: Regular rate and rhythm, no S3, 1/6 systolic murmur, no pericardial rub. Extremities: No pitting edema.  ECG:  An ECG dated 10/08/2019 was personally reviewed today and demonstrated:  Dual chamber paced rhythm.  Recent Labwork:  December 2021: Hgb 12.9, platelets 213 April 2022: Potassium 4.3, BUN 11, creatinine 0.8  Other Studies Reviewed  Today:  Echocardiogram 06/06/2017: - Left ventricle: The cavity size was normal. Wall thickness was    normal. Systolic function was normal. The estimated ejection    fraction was in the range of 55% to 60%. Wall motion was normal;    there were no regional wall motion abnormalities. Indeterminate    diastolic function, possibly restrictive diastolic filling.  - Aortic valve: Mildly to moderately calcified annulus. Trileaflet.  - Mitral valve: Mildly calcified annulus. Mildly thickened    leaflets. There was moderate regurgitation. PISA not obtained.  - Left atrium: The atrium was massively dilated.  - Right ventricle: Pacer wire or catheter noted in right ventricle.  - Right atrium: The atrium was severely dilated. Pacer wire or    catheter noted in right atrium. Central venous pressure (est): 3    mm Hg.  - Atrial septum: The septum bowed from left to right, consistent    with increased left atrial pressure. No defect or patent foramen    ovale was identified.  - Tricuspid valve: There was mild-moderate regurgitation.  - Pulmonic valve: There was trivial regurgitation.  - Pulmonary arteries: PA peak pressure: 40 mm Hg (S).  - Pericardium, extracardiac: A small pericardial effusion was    identified anterior to the heart with trivial effusion    posteriorly.   Assessment and Plan:  1.  Paroxysmal to persistent atrial fibrillation with CHA2DS2-VASc score of 4-5.  She is asymptomatic in terms of palpitations and continues on Toprol-XL.  Continue Xarelto 15 mg daily based on creatinine clearance.  Check CBC and BMET for next visit.  2.  Complete heart block with St. Jude pacemaker in place followed by Dr. Lovena Le.  Most recent device interrogation revealed normal function.  3.  Essential hypertension, systolic is [id number] today.  No changes made today, keep follow-up with PCP.  Medication Adjustments/Labs and Tests Ordered: Current medicines are reviewed at length with the patient today.   Concerns regarding medicines are outlined above.   Tests Ordered: Orders Placed This Encounter  Procedures   CBC   Basic metabolic panel     Medication Changes: No orders of the defined types were placed in this encounter.   Disposition:  Follow up  6 months.  Signed, Satira Sark, MD, [facility] 08/10/2020 2:14 PM    [facility] at [facility] [address] [place], [place] [address] Phone: [phone]; Fax: [phone]

## 2020-08-10 ENCOUNTER — Ambulatory Visit (INDEPENDENT_AMBULATORY_CARE_PROVIDER_SITE_OTHER): Payer: Medicare Other | Admitting: Cardiology

## 2020-08-10 ENCOUNTER — Encounter: Payer: Self-pay | Admitting: Cardiology

## 2020-08-10 ENCOUNTER — Other Ambulatory Visit: Payer: Self-pay

## 2020-08-10 VITALS — BP 140/72 | HR 72 | Ht 65.0 in | Wt 144.4 lb

## 2020-08-10 DIAGNOSIS — I10 Essential (primary) hypertension: Secondary | ICD-10-CM | POA: Diagnosis not present

## 2020-08-10 DIAGNOSIS — I480 Paroxysmal atrial fibrillation: Secondary | ICD-10-CM | POA: Diagnosis not present

## 2020-08-10 DIAGNOSIS — I442 Atrioventricular block, complete: Secondary | ICD-10-CM

## 2020-08-10 NOTE — Patient Instructions (Signed)
Medication Instructions:  Your physician recommends that you continue on your current medications as directed. Please refer to the Current Medication list given to you today.  *If you need a refill on your cardiac medications before your next appointment, please call your pharmacy*   Lab Work: 6 MONTHS: CSC/BMET If you have labs (blood work) drawn today and your tests are completely normal, you will receive your results only by: [place] (if you have MyChart) OR A paper copy in the mail If you have any lab test that is abnormal or we need to change your treatment, we will call you to review the results.   Testing/Procedures: None   Follow-Up: At [facility], you and your health needs are our priority.  As part of our continuing mission to provide you with exceptional heart care, we have created designated Provider Care Teams.  These Care Teams include your primary Cardiologist (physician) and Advanced Practice Providers (APPs -  Physician Assistants and Nurse Practitioners) who all work together to provide you with the care you need, when you need it.  We recommend signing up for the patient portal called "MyChart".  Sign up information is provided on this After Visit Summary.  MyChart is used to connect with patients for Virtual Visits (Telemedicine).  Patients are able to view lab/test results, encounter notes, upcoming appointments, etc.  Non-urgent messages can be sent to your provider as well.   To learn more about what you can do with MyChart, go to [email or web address].    Your next appointment:   6 month(s)  The format for your next appointment:   In Person  Provider:   Rozann Lesches, MD   Other Instructions

## 2020-09-14 ENCOUNTER — Ambulatory Visit (INDEPENDENT_AMBULATORY_CARE_PROVIDER_SITE_OTHER): Payer: Medicare Other

## 2020-09-14 DIAGNOSIS — I442 Atrioventricular block, complete: Secondary | ICD-10-CM | POA: Diagnosis not present

## 2020-09-14 LAB — CUP PACEART REMOTE DEVICE CHECK
Battery Remaining Longevity: 67 mo
Battery Remaining Percentage: 63 %
Battery Voltage: 2.99 V
Brady Statistic AP VP Percent: 99 %
Brady Statistic AP VS Percent: 1 %
Brady Statistic AS VP Percent: 1 %
Brady Statistic AS VS Percent: 1 %
Brady Statistic RA Percent Paced: 99 %
Brady Statistic RV Percent Paced: 99 %
Date Time Interrogation Session: 20220907020015
Implantable Lead Implant Date: 20190524
Implantable Lead Implant Date: 20190524
Implantable Lead Location: 753859
Implantable Lead Location: 753860
Implantable Pulse Generator Implant Date: 20190524
Lead Channel Impedance Value: 430 Ohm
Lead Channel Impedance Value: 850 Ohm
Lead Channel Pacing Threshold Amplitude: 0.75 V
Lead Channel Pacing Threshold Amplitude: 1.25 V
Lead Channel Pacing Threshold Pulse Width: 0.5 ms
Lead Channel Pacing Threshold Pulse Width: 0.5 ms
Lead Channel Sensing Intrinsic Amplitude: 1.4 mV
Lead Channel Sensing Intrinsic Amplitude: 12 mV
Lead Channel Setting Pacing Amplitude: 1.5 V
Lead Channel Setting Pacing Amplitude: 2 V
Lead Channel Setting Pacing Pulse Width: 0.5 ms
Lead Channel Setting Sensing Sensitivity: 2 mV
Pulse Gen Model: 2272
Pulse Gen Serial Number: 9022503

## 2020-09-22 NOTE — Progress Notes (Signed)
Remote pacemaker transmission.   

## 2020-12-14 ENCOUNTER — Ambulatory Visit (INDEPENDENT_AMBULATORY_CARE_PROVIDER_SITE_OTHER): Payer: Medicare Other

## 2020-12-14 DIAGNOSIS — I442 Atrioventricular block, complete: Secondary | ICD-10-CM

## 2020-12-14 DIAGNOSIS — Z950 Presence of cardiac pacemaker: Secondary | ICD-10-CM | POA: Diagnosis not present

## 2020-12-14 LAB — CUP PACEART REMOTE DEVICE CHECK
Battery Remaining Longevity: 62 mo
Battery Remaining Percentage: 60 %
Battery Voltage: 2.99 V
Brady Statistic AP VP Percent: 99 %
Brady Statistic AP VS Percent: 1 %
Brady Statistic AS VP Percent: 1 %
Brady Statistic AS VS Percent: 1 %
Brady Statistic RA Percent Paced: 99 %
Brady Statistic RV Percent Paced: 99 %
Date Time Interrogation Session: 20221207020012
Implantable Lead Implant Date: 20190524
Implantable Lead Implant Date: 20190524
Implantable Lead Location: 753859
Implantable Lead Location: 753860
Implantable Pulse Generator Implant Date: 20190524
Lead Channel Impedance Value: 410 Ohm
Lead Channel Impedance Value: 590 Ohm
Lead Channel Pacing Threshold Amplitude: 0.75 V
Lead Channel Pacing Threshold Amplitude: 0.75 V
Lead Channel Pacing Threshold Pulse Width: 0.5 ms
Lead Channel Pacing Threshold Pulse Width: 0.5 ms
Lead Channel Sensing Intrinsic Amplitude: 1.4 mV
Lead Channel Sensing Intrinsic Amplitude: 12 mV
Lead Channel Setting Pacing Amplitude: 1 V
Lead Channel Setting Pacing Amplitude: 2 V
Lead Channel Setting Pacing Pulse Width: 0.5 ms
Lead Channel Setting Sensing Sensitivity: 2 mV
Pulse Gen Model: 2272
Pulse Gen Serial Number: 9022503

## 2020-12-23 NOTE — Progress Notes (Signed)
Remote pacemaker transmission.   

## 2021-02-11 ENCOUNTER — Encounter (HOSPITAL_COMMUNITY): Payer: Self-pay | Admitting: Emergency Medicine

## 2021-02-11 ENCOUNTER — Emergency Department (HOSPITAL_COMMUNITY): Payer: Medicare Other

## 2021-02-11 ENCOUNTER — Other Ambulatory Visit: Payer: Self-pay

## 2021-02-11 ENCOUNTER — Emergency Department (HOSPITAL_COMMUNITY)
Admission: EM | Admit: 2021-02-11 | Discharge: 2021-02-11 | Disposition: A | Discharge status: Home / Self Care | Payer: Medicare Other | Attending: Emergency Medicine | Admitting: Emergency Medicine

## 2021-02-11 DIAGNOSIS — U071 COVID-19: Secondary | ICD-10-CM | POA: Diagnosis not present

## 2021-02-11 DIAGNOSIS — I10 Essential (primary) hypertension: Secondary | ICD-10-CM | POA: Insufficient documentation

## 2021-02-11 DIAGNOSIS — R531 Weakness: Secondary | ICD-10-CM | POA: Diagnosis not present

## 2021-02-11 DIAGNOSIS — Z7901 Long term (current) use of anticoagulants: Secondary | ICD-10-CM | POA: Diagnosis not present

## 2021-02-11 DIAGNOSIS — F0390 Unspecified dementia without behavioral disturbance: Secondary | ICD-10-CM | POA: Diagnosis not present

## 2021-02-11 DIAGNOSIS — I4891 Unspecified atrial fibrillation: Secondary | ICD-10-CM | POA: Diagnosis not present

## 2021-02-11 DIAGNOSIS — Z79899 Other long term (current) drug therapy: Secondary | ICD-10-CM | POA: Diagnosis not present

## 2021-02-11 DIAGNOSIS — R5383 Other fatigue: Secondary | ICD-10-CM | POA: Diagnosis present

## 2021-02-11 LAB — CBC WITH DIFFERENTIAL/PLATELET
Abs Immature Granulocytes: 0.01 10*3/uL (ref 0.00–0.07)
Basophils Absolute: 0 10*3/uL (ref 0.0–0.1)
Basophils Relative: 1 %
Eosinophils Absolute: 0.2 10*3/uL (ref 0.0–0.5)
Eosinophils Relative: 6 %
HCT: 40.5 % (ref 36.0–46.0)
Hemoglobin: 13 g/dL (ref 12.0–15.0)
Immature Granulocytes: 0 %
Lymphocytes Relative: 31 %
Lymphs Abs: 1.1 10*3/uL (ref 0.7–4.0)
MCH: 30.4 pg (ref 26.0–34.0)
MCHC: 32.1 g/dL (ref 30.0–36.0)
MCV: 94.6 fL (ref 80.0–100.0)
Monocytes Absolute: 0.7 10*3/uL (ref 0.1–1.0)
Monocytes Relative: 18 %
Neutro Abs: 1.7 10*3/uL (ref 1.7–7.7)
Neutrophils Relative %: 44 %
Platelets: 146 10*3/uL — ABNORMAL LOW (ref 150–400)
RBC: 4.28 MIL/uL (ref 3.87–5.11)
RDW: 14.5 % (ref 11.5–15.5)
WBC: 3.7 10*3/uL — ABNORMAL LOW (ref 4.0–10.5)
nRBC: 0 % (ref 0.0–0.2)

## 2021-02-11 LAB — COMPREHENSIVE METABOLIC PANEL
ALT: 15 U/L (ref 0–44)
AST: 23 U/L (ref 15–41)
Albumin: 3.4 g/dL — ABNORMAL LOW (ref 3.5–5.0)
Alkaline Phosphatase: 77 U/L (ref 38–126)
Anion gap: 5 (ref 5–15)
BUN: 14 mg/dL (ref 8–23)
CO2: 26 mmol/L (ref 22–32)
Calcium: 8.4 mg/dL — ABNORMAL LOW (ref 8.9–10.3)
Chloride: 102 mmol/L (ref 98–111)
Creatinine, Ser: 0.82 mg/dL (ref 0.44–1.00)
GFR, Estimated: >60 mL/min (ref >60)
Glucose, Bld: 104 mg/dL — ABNORMAL HIGH (ref 70–99)
Potassium: 3.8 mmol/L (ref 3.5–5.1)
Sodium: 133 mmol/L — ABNORMAL LOW (ref 135–145)
Total Bilirubin: 0.7 mg/dL (ref 0.3–1.2)
Total Protein: 6.3 g/dL — ABNORMAL LOW (ref 6.5–8.1)

## 2021-02-11 LAB — RESP PANEL BY RT-PCR (FLU A&B, COVID) ARPGX2
Influenza A by PCR: NEGATIVE
Influenza B by PCR: NEGATIVE
SARS Coronavirus 2 by RT PCR: POSITIVE — AB

## 2021-02-11 LAB — TSH: TSH: 4.86 u[IU]/mL — ABNORMAL HIGH (ref 0.350–4.500)

## 2021-02-11 LAB — MAGNESIUM: Magnesium: 2.1 mg/dL (ref 1.7–2.4)

## 2021-02-11 MED ORDER — LACTATED RINGERS IV BOLUS
500.0000 mL | Freq: Once | INTRAVENOUS | Status: AC
  Administered 2021-02-11: 500 mL via INTRAVENOUS

## 2021-02-11 NOTE — ED Provider Notes (Signed)
[facility] EMERGENCY DEPARTMENT Provider Note   CSN: [id number] Arrival date & time: 02/11/21  1126     History  Chief Complaint  Patient presents with   Fatigue    Renee Cameron is a 88 y.o. female.  HPI Patient presenting from [facility] via EMS for fatigue and generalized weakness.  Medical history is notable for atrial fibrillation, HTN, complete heart block, hyperparathyroidism, dementia.  She was diagnosed with COVID-19 3 days ago.  There is no report of vomiting or diarrhea.  There was concern of hypoxia by EMS noted 97% SPO2 on room air.  Patient has no complaints at this time.  She did eat breakfast this morning.  She did walk to the EMS stretcher.   History per daughter: Concern at nursing facility was regarding hypoxia.  Patient was reportedly 88% on room air.  Patient's daughter was not informed of any other concerns from the facility.      Home Medications Prior to Admission medications   Medication Sig Start Date End Date Taking? Authorizing Provider  ALPRAZolam (XANAX) 0.25 MG tablet Take 0.25 mg by mouth 2 (two) times daily as needed. 10/02/19   [provider]  atorvastatin (LIPITOR) 40 MG tablet Take 40 mg by mouth daily.    [provider]  desvenlafaxine (PRISTIQ) 50 MG 24 hr tablet Take 1 tablet by mouth daily. 05/18/17   [provider]  donepezil (ARICEPT) 5 MG tablet Take 5 mg by mouth at bedtime. 04/09/17   [provider]  furosemide (LASIX) 20 MG tablet Take 20 mg by mouth daily.    [provider]  lisinopril (ZESTRIL) 5 MG tablet Take 5 mg by mouth daily. Patient not taking: Reported on 08/10/2020 09/17/19   [provider]  losartan (COZAAR) 25 MG tablet Take 25 mg by mouth daily. 07/21/20   [provider]  MEGARED OMEGA-3 KRILL OIL 500 MG CAPS Take 500 mg by mouth daily at 12 noon.    [provider]  memantine (NAMENDA) 10 MG tablet Take 1 tablet by mouth  daily. Patient not taking: Reported on 08/10/2020 04/20/17   [provider]  metoprolol succinate (TOPROL-XL) 25 MG 24 hr tablet TAKE 1 TABLET BY MOUTH EVERY DAY 05/13/15   Herminio Commons, MD  Multiple Vitamins-Minerals (PRESERVISION AREDS 2+MULTI VIT) CAPS Take 2 capsules by mouth daily.    [provider]  potassium chloride (K-DUR,KLOR-CON) 10 MEQ tablet Take 1 tablet by mouth daily. 09/07/17   [provider]  XARELTO 15 MG TABS tablet TAKE 1 TABLET(15 MG) BY MOUTH DAILY WITH DINNER 10/30/16   Herminio Commons, MD      Allergies    Patient has no known allergies.    Review of Systems   Review of Systems  Constitutional:  Positive for fatigue.  Neurological:  Positive for weakness (Generalized).  All other systems reviewed and are negative.  Physical Exam Updated Vital Signs BP 133/73    Pulse 72    Temp 98.9 F (37.2 C) (Oral)    Resp 12    Ht 5\' 3"  (1.6 m)    Wt 61.2 kg    SpO2 95%    BMI 23.91 kg/m  Physical Exam Vitals and nursing note reviewed.  Constitutional:      General: She is not in acute distress.    Appearance: Normal appearance. She is well-developed and normal weight. She is not ill-appearing, toxic-appearing or diaphoretic.  HENT:     Head:  Normocephalic and atraumatic.     Right Ear: External ear normal.     Left Ear: External ear normal.     Nose: Nose normal.     Mouth/Throat:     Mouth: Mucous membranes are moist.     Pharynx: Oropharynx is clear.  Eyes:     Extraocular Movements: Extraocular movements intact.     Conjunctiva/sclera: Conjunctivae normal.  Cardiovascular:     Rate and Rhythm: Normal rate and regular rhythm.     Heart sounds: No murmur heard. Pulmonary:     Effort: Pulmonary effort is normal. No respiratory distress.     Breath sounds: Normal breath sounds. No wheezing or rales.  Abdominal:     Palpations: Abdomen is soft.     Tenderness: There is no abdominal tenderness.  Musculoskeletal:         General: No swelling.     Cervical back: Normal range of motion and neck supple.     Right lower leg: No edema.     Left lower leg: No edema.  Skin:    General: Skin is warm and dry.     Capillary Refill: Capillary refill takes less than 2 seconds.     Coloration: Skin is not jaundiced or pale.  Neurological:     General: No focal deficit present.     Mental Status: She is alert. Mental status is at baseline.  Psychiatric:        Mood and Affect: Mood normal.        Behavior: Behavior normal.    ED Results / Procedures / Treatments   Labs (all labs ordered are listed, but only abnormal results are displayed) Labs Reviewed  RESP PANEL BY RT-PCR (FLU A&B, COVID) ARPGX2 - Abnormal; Notable for the following components:      Result Value   SARS Coronavirus 2 by RT PCR POSITIVE (*)    All other components within normal limits  COMPREHENSIVE METABOLIC PANEL - Abnormal; Notable for the following components:   Sodium 133 (*)    Glucose, Bld 104 (*)    Calcium 8.4 (*)    Total Protein 6.3 (*)    Albumin 3.4 (*)    All other components within normal limits  CBC WITH DIFFERENTIAL/PLATELET - Abnormal; Notable for the following components:   WBC 3.7 (*)    Platelets 146 (*)    All other components within normal limits  TSH - Abnormal; Notable for the following components:   TSH 4.860 (*)    All other components within normal limits  MAGNESIUM  URINALYSIS, ROUTINE W REFLEX MICROSCOPIC    EKG EKG Interpretation  Date/Time:  Saturday February 11 2021 11:39:00 EST Ventricular Rate:  71 PR Interval:    QRS Duration: 153 QT Interval:  447 QTC Calculation: 486 R Axis:   261 Text Interpretation: Accelerated junctional rhythm Nonspecific IVCD with LAD Extensive anterior infarct, old Confirmed by Godfrey Pick [id number]) on 02/11/2021 12:32:28 PM  Radiology DG Chest Portable 1 View  Result Date: 02/11/2021 CLINICAL DATA:  Shortness of breath and fatigue. EXAM: PORTABLE CHEST 1 VIEW  COMPARISON:  09/28/2017 FINDINGS: 1200 hours. Low lung volumes. The cardio pericardial silhouette is enlarged. Interstitial markings are diffusely coarsened with chronic features. Osteopenia with convex rightward thoracic scoliosis. Dual lead permanent pacemaker noted. Telemetry leads overlie the chest. IMPRESSION: Low lung volumes without acute cardiopulmonary findings. Electronically Signed   By: Misty Stanley M.D.   On: 02/11/2021 12:08    Procedures Procedures  Medications Ordered in ED Medications  lactated ringers bolus 500 mL (0 mLs Intravenous Stopped 02/11/21 1329)    ED Course/ Medical Decision Making/ A&P                           Medical Decision Making Amount and/or Complexity of Data Reviewed Labs: ordered. Radiology: ordered.   This patient presents to the ED for concern of fatigue, this involves an extensive number of treatment options, and is a complaint that carries with it a high risk of complications and morbidity.  The differential diagnosis includes COVID-19 and symptom, dehydration, electrolyte abnormalities, CHF, PE   Co morbidities that complicate the patient evaluation  atrial fibrillation, HTN, complete heart block, hyperparathyroidism, dementia   Additional history obtained:  Additional history obtained from EMS, patient's daughter External records from outside source obtained and reviewed including EMR   Lab Tests:  I Ordered, and personally interpreted labs.  The pertinent results include: Leukopenia, normal hemoglobin, normal electrolytes, COVID-19 positive   Imaging Studies ordered:  I ordered imaging studies including chest x-ray I independently visualized and interpreted imaging which showed no acute finding I agree with the radiologist interpretation   Cardiac Monitoring:  The patient was maintained on a cardiac monitor.  I personally viewed and interpreted the cardiac monitored which showed an underlying rhythm of: Paced  rhythm   Medicines ordered and prescription drug management:  I ordered medication including IV fluid for decreased p.o. intake Reevaluation of the patient after these medicines showed that the patient improved I have reviewed the patients home medicines and have made adjustments as needed   Problem List / ED Course:  Patient is a 88 year old female presenting from a skilled nursing facility for what was initially reported as generalized weakness secondary to COVID-19 infection.  On arrival in the ED, she currently has no physical complaints.  Her vital signs are reassuring and her SPO2 is normal on room air.  Her breathing is even and unlabored and her lungs are clear to auscultation.  Work-up was initiated to set for electrolyte abnormalities, other sources of her fatigue and generalized weakness.  500 cc bolus of IV fluids was ordered.  Patient's initial work-up results are reassuring.  While in the ED, she was accompanied by her daughter at bedside.  Patient's daughter did hear from the skilled nursing facility and from what she heard there was simply a concern of hypoxia.  Patient was reportedly found to be 88% SPO2 on room air.  While in the ED, patient had normal SPO2 that ranged from 93 to 97% on room air.  I personally got the patient up and walked her at bedside while monitoring SPO2.  Patient's SPO2 actually increased while she was walking.  She denies any exertional shortness of breath.  Given her minimal symptoms and absence of any hypoxia in the ED, including with ambulation, patient is stable for discharge.  Patient's daughter stated that the patient is undergoing antiviral therapy for her COVID-19 infection, although this was not noted on her nursing facility paperwork.  Patient does currently take a blood thinner and a statin.  Patient's daughter stated that they had reduced some of her medications while she is on the 5-day course of antiviral therapy.  Both patient and daughter do feel  comfortable with discharge.  Patient's daughter to take the patient back to facility in her POV.  Patient was encouraged to return to the ED at any time if she  does experience new or worsening symptoms.  She was discharged in stable condition.   Reevaluation:  After the interventions noted above, I reevaluated the patient and found that they have :improved   Social Determinants of Health:  Lives in nursing facility, documented history of dementia    Dispostion:  After consideration of the diagnostic results and the patients response to treatment, I feel that the patent would benefit from discharge.          Final Clinical Impression(s) / ED Diagnoses Final diagnoses:  [id number]    Rx / DC Orders ED Discharge Orders     None         Godfrey Pick, MD 02/11/21 1725

## 2021-02-11 NOTE — ED Triage Notes (Signed)
Patient brought in via EMS from [place]. Alert and oriented. HOH. Airway patient. Patient tested positive for Covid on Wednesday and pneumonia. Patient's O2 sat per facility 88%, upon paramedics arrival, O2 sat 97% on room air. Patent c/o fatigue and loss of appetite due to loss of smell and taste. Blood sugar 117 per paramedic.

## 2021-02-15 ENCOUNTER — Ambulatory Visit: Payer: Medicare Other | Admitting: Cardiology

## 2021-03-07 ENCOUNTER — Other Ambulatory Visit: Payer: Self-pay

## 2021-03-07 ENCOUNTER — Encounter: Payer: Self-pay | Admitting: Internal Medicine

## 2021-03-07 ENCOUNTER — Ambulatory Visit (INDEPENDENT_AMBULATORY_CARE_PROVIDER_SITE_OTHER): Payer: Medicare Other | Admitting: Internal Medicine

## 2021-03-07 VITALS — BP 122/68 | HR 72 | Ht 64.0 in | Wt 140.0 lb

## 2021-03-07 DIAGNOSIS — I442 Atrioventricular block, complete: Secondary | ICD-10-CM

## 2021-03-07 NOTE — Progress Notes (Signed)
HPI Renee Cameron returns today for ongoing evaluation and management of her CHB, sinus node dysfunction, s/p PPM insertion. She has some dementia and is on namenda. SHe denies chest pain or sob. No Known Allergies   Current Outpatient Medications  Medication Sig Dispense Refill   ALPRAZolam (XANAX) 0.25 MG tablet Take 0.25 mg by mouth 2 (two) times daily as needed.     atorvastatin (LIPITOR) 40 MG tablet Take 40 mg by mouth daily.     desvenlafaxine (PRISTIQ) 50 MG 24 hr tablet Take 1 tablet by mouth daily.     donepezil (ARICEPT) 5 MG tablet Take 5 mg by mouth at bedtime.  2   furosemide (LASIX) 20 MG tablet Take 20 mg by mouth daily.     losartan (COZAAR) 25 MG tablet Take 25 mg by mouth daily.     MEGARED OMEGA-3 KRILL OIL 500 MG CAPS Take 500 mg by mouth daily at 12 noon.     memantine (NAMENDA) 10 MG tablet Take 1 tablet by mouth daily.     metoprolol succinate (TOPROL-XL) 12.5 mg TB24 24 hr tablet Take 12.5 mg by mouth daily.     Multiple Vitamins-Minerals (PRESERVISION AREDS 2+MULTI VIT) CAPS Take 2 capsules by mouth daily.     potassium chloride (K-DUR,KLOR-CON) 10 MEQ tablet Take 1 tablet by mouth daily.     XARELTO 15 MG TABS tablet TAKE 1 TABLET(15 MG) BY MOUTH DAILY WITH DINNER 30 tablet 6   No current facility-administered medications for this visit.     Past Medical History:  Diagnosis Date   Atrial fibrillation [facility])    Chronic anticoagulation    Complete heart block ([place])    a. s/p STJ PPM 05/2017.   Dementia [facility])    Essential hypertension    Hyperthyroidism 2000   2000   Osteoporosis     ROS:   All systems reviewed and negative except as noted in the HPI.   Past Surgical History:  Procedure Laterality Date   ABDOMINAL HYSTERECTOMY     APPENDECTOMY     COLONOSCOPY  2005   Dr. Laural Golden: few small diverticula. external hemorrhoids   PACEMAKER IMPLANT N/A 05/31/2017   Procedure: PACEMAKER IMPLANT;  Surgeon: Evans Lance, MD;  Location: [facility]  CV LAB;  Service: Cardiovascular;  Laterality: N/A;   TOTAL ABDOMINAL HYSTERECTOMY W/ BILATERAL SALPINGOOPHORECTOMY       Family History  Problem Relation Age of Onset   Prostate cancer Father    Stroke Father    Heart disease Mother    Coronary artery disease Sister        with prior CABG surgery     Social History   Socioeconomic History   Marital status: Widowed    Spouse name: Not on file   Number of children: Not on file   Years of education: Not on file   Highest education level: Not on file  Occupational History   Occupation: Retired    Comment: [employer]  Tobacco Use   Smoking status: Never   Smokeless tobacco: Never  Vaping Use   Vaping Use: Never used  Substance and Sexual Activity   Alcohol use: No    Alcohol/week: 0.0 standard drinks   Drug use: No   Sexual activity: Not on file  Other Topics Concern   Not on file  Social History Narrative   Volunteers 2 days weekly   Social Determinants of Health   Financial Resource Strain: Not on  file  Food Insecurity: Not on file  Transportation Needs: Not on file  Physical Activity: Not on file  Stress: Not on file  Social Connections: Not on file  Intimate Partner Violence: Not on file     BP 122/68    Pulse 72    Ht 5\' 4"  (1.626 m)    Wt 140 lb (63.5 kg)    SpO2 94%    BMI 24.03 kg/m   Physical Exam:  Well appearing NAD HEENT: Unremarkable Neck:  No JVD, no thyromegally Lymphatics:  No adenopathy Back:  No CVA tenderness Lungs:  Clear HEART:  Regular rate rhythm, no murmurs, no rubs, no clicks Abd:  soft, positive bowel sounds, no organomegally, no rebound, no guarding Ext:  2 plus pulses, no edema, no cyanosis, no clubbing Skin:  No rashes no nodules Neuro:  CN II through XII intact, motor grossly intact  DEVICE  Normal device function.  See PaceArt for details.   Assess/Plan:  1. CHB - she is asymptomatic, s/p PPM insertion. 2. PPM - her [employer] DDD PM is working normally.   3. Sinus node dysfunction - she is asymptomatic. She has a junctional escape at 40/min. 4. HTN - her bp is well controlled. No change in her meds.   Carleene Overlie Jerid Catherman,MD

## 2021-03-07 NOTE — Patient Instructions (Signed)
Medication Instructions:  Your physician recommends that you continue on your current medications as directed. Please refer to the Current Medication list given to you today.  *If you need a refill on your cardiac medications before your next appointment, please call your pharmacy*   Lab Work: NONE   If you have labs (blood work) drawn today and your tests are completely normal, you will receive your results only by: . MyChart Message (if you have MyChart) OR . A paper copy in the mail If you have any lab test that is abnormal or we need to change your treatment, we will call you to review the results.   Testing/Procedures: NONE    Follow-Up: At [facility], you and your health needs are our priority.  As part of our continuing mission to provide you with exceptional heart care, we have created designated Provider Care Teams.  These Care Teams include your primary Cardiologist (physician) and Advanced Practice Providers (APPs -  Physician Assistants and Nurse Practitioners) who all work together to provide you with the care you need, when you need it.  We recommend signing up for the patient portal called "MyChart".  Sign up information is provided on this After Visit Summary.  MyChart is used to connect with patients for Virtual Visits (Telemedicine).  Patients are able to view lab/test results, encounter notes, upcoming appointments, etc.  Non-urgent messages can be sent to your provider as well.   To learn more about what you can do with MyChart, go to [email or web address].    Your next appointment:   1 year(s)  The format for your next appointment:   In Person  Provider:   Gregg Taylor, MD   Other Instructions Thank you for choosing [facility]!    

## 2021-03-15 ENCOUNTER — Ambulatory Visit (INDEPENDENT_AMBULATORY_CARE_PROVIDER_SITE_OTHER): Payer: Medicare Other

## 2021-03-15 DIAGNOSIS — I442 Atrioventricular block, complete: Secondary | ICD-10-CM

## 2021-03-15 LAB — CUP PACEART REMOTE DEVICE CHECK
Battery Remaining Longevity: 60 mo
Battery Remaining Percentage: 57 %
Battery Voltage: 2.99 V
Brady Statistic AP VP Percent: 99 %
Brady Statistic AP VS Percent: 1 %
Brady Statistic AS VP Percent: 1 %
Brady Statistic AS VS Percent: 1 %
Brady Statistic RA Percent Paced: 99 %
Brady Statistic RV Percent Paced: 99 %
Date Time Interrogation Session: 20230308020019
Implantable Lead Implant Date: 20190524
Implantable Lead Implant Date: 20190524
Implantable Lead Location: 753859
Implantable Lead Location: 753860
Implantable Pulse Generator Implant Date: 20190524
Lead Channel Impedance Value: 430 Ohm
Lead Channel Impedance Value: 510 Ohm
Lead Channel Pacing Threshold Amplitude: 0.5 V
Lead Channel Pacing Threshold Amplitude: 0.625 V
Lead Channel Pacing Threshold Pulse Width: 0.5 ms
Lead Channel Pacing Threshold Pulse Width: 0.5 ms
Lead Channel Sensing Intrinsic Amplitude: 1.7 mV
Lead Channel Sensing Intrinsic Amplitude: 12 mV
Lead Channel Setting Pacing Amplitude: 0.875
Lead Channel Setting Pacing Amplitude: 2 V
Lead Channel Setting Pacing Pulse Width: 0.5 ms
Lead Channel Setting Sensing Sensitivity: 2 mV
Pulse Gen Model: 2272
Pulse Gen Serial Number: 9022503

## 2021-03-29 NOTE — Progress Notes (Signed)
Remote pacemaker transmission.   

## 2021-04-10 NOTE — Progress Notes (Signed)
? ?Cardiology Office Note   ? ?Date:  04/24/2021  ? ?ID:  Renee Cameron, DOB 09-Oct-1924, MRN [id number] ? ? ?PCP:  Renee Man, FNP ?  ?[place]  ?Cardiologist:  Renee Lesches, MD   ?Advanced Practice Provider:  No care team member to display ?Electrophysiologist:  Renee Peru, MD  ? ?[id number]}  ? ?No chief complaint on file. ? ? ?History of Present Illness:  ?Renee Cameron is a 88 y.o. female with history of PAF on Toprol and Xarelto 15 mg based on creatinine clearance, complete heart block with [facility] pacemaker followed by Dr. Lovena Le device interrogation 03/07/2021 working normally, essential hypertension. ? ?Patient last saw Dr. Domenic Polite 08/10/2020 and was doing well.  She resides at [place]. ? ?Patient comes in with her daughter. Denies chest pain, dyspnea, palpitations, dizziness, edema. No bleeding problems on xarelto. ? ? ? ?Past Medical History:  ?Diagnosis Date  ? Atrial fibrillation ([place])   ? Chronic anticoagulation   ? Complete heart block ([place])   ? a. s/p STJ PPM 05/2017.  ? Dementia ([place])   ? Essential hypertension   ? Hyperthyroidism 2000  ? 2000  ? Osteoporosis   ? ? ?Past Surgical History:  ?Procedure Laterality Date  ? ABDOMINAL HYSTERECTOMY    ? APPENDECTOMY    ? COLONOSCOPY  2005  ? Dr. Laural Golden: few small diverticula. external hemorrhoids  ? PACEMAKER IMPLANT N/A 05/31/2017  ? Procedure: PACEMAKER IMPLANT;  Surgeon: Evans Lance, MD;  Location: [place] [facility];  Service: Cardiovascular;  Laterality: N/A;  ? TOTAL ABDOMINAL HYSTERECTOMY W/ BILATERAL SALPINGOOPHORECTOMY    ? ? ?Current Medications: ?Current Meds  ?Medication Sig  ? ALPRAZolam (XANAX) 0.25 MG tablet Take 0.25 mg by mouth 2 (two) times daily as needed.  ? atorvastatin (LIPITOR) 40 MG tablet Take 40 mg by mouth daily.  ? desvenlafaxine (PRISTIQ) 50 MG 24 hr tablet Take 1 tablet by mouth daily.  ? donepezil (ARICEPT) 5 MG tablet Take 5 mg by mouth at bedtime.  ? furosemide  (LASIX) 20 MG tablet Take 20 mg by mouth daily.  ? losartan (COZAAR) 25 MG tablet Take 25 mg by mouth daily.  ? MEGARED OMEGA-3 KRILL OIL 500 MG CAPS Take 500 mg by mouth daily at 12 noon.  ? memantine (NAMENDA) 10 MG tablet Take 1 tablet by mouth daily.  ? metoprolol succinate (TOPROL-XL) 12.5 mg TB24 24 hr tablet Take 12.5 mg by mouth daily.  ? Multiple Vitamins-Minerals (PRESERVISION AREDS 2+MULTI VIT) CAPS Take 2 capsules by mouth daily.  ? potassium chloride (K-DUR,KLOR-CON) 10 MEQ tablet Take 1 tablet by mouth daily.  ? XARELTO 15 MG TABS tablet TAKE 1 TABLET(15 MG) BY MOUTH DAILY WITH DINNER  ?  ? ?Allergies:   Patient has no known allergies.  ? ?Social History  ? ?Socioeconomic History  ? Marital status: Widowed  ?  Spouse name: Not on file  ? Number of children: Not on file  ? Years of education: Not on file  ? Highest education level: Not on file  ?Occupational History  ? Occupation: Retired  ?  Comment: [employer]  ?Tobacco Use  ? Smoking status: Never  ? Smokeless tobacco: Never  ?Vaping Use  ? Vaping Use: Never used  ?Substance and Sexual Activity  ? Alcohol use: No  ?  Alcohol/week: 0.0 standard drinks  ? Drug use: No  ? Sexual activity: Not on file  ?Other Topics Concern  ?  Not on file  ?Social History Narrative  ? Volunteers 2 days weekly  ? ?Social Determinants of Health  ? ?Financial Resource Strain: Not on file  ?Food Insecurity: Not on file  ?Transportation Needs: Not on file  ?Physical Activity: Not on file  ?Stress: Not on file  ?Social Connections: Not on file  ?  ? ?Family History:  The patient's  family history includes Coronary artery disease in her sister; Heart disease in her mother; Prostate cancer in her father; Stroke in her father.  ? ?ROS:   ?Please see the history of present illness.    ?ROS All other systems reviewed and are negative. ? ? ?PHYSICAL EXAM:   ?VS:  BP 126/64   Pulse 70   Ht '5\' 3"'$  (1.6 m)   Wt 142 lb (64.4 kg)   SpO2 95%   BMI 25.15 kg/m?   ?Physical  Exam  ?GEN: Well nourished, well developed, in no acute distress, looks younger than stated age. ?Neck: no JVD, carotid bruits, or masses ?Cardiac:RRR; 2/6 systolic murmur LSB ?Respiratory:  clear to auscultation bilaterally, normal work of breathing ?GI: soft, nontender, nondistended, + BS ?Ext: without cyanosis, clubbing, or edema, Good distal pulses bilaterally ?Neuro:  Alert and Oriented x 3,  ?Psych: euthymic mood, full affect ? ?Wt Readings from Last 3 Encounters:  ?04/24/21 142 lb (64.4 kg)  ?03/07/21 140 lb (63.5 kg)  ?02/11/21 135 lb (61.2 kg)  ?  ? ? ?Studies/Labs Reviewed:  ? ?EKG:  EKG is not ordered today.    ? ?Recent Labs: ?02/11/2021: ALT 15; BUN 14; Creatinine, Ser 0.82; Hemoglobin 13.0; Magnesium 2.1; Platelets 146; Potassium 3.8; Sodium 133; TSH 4.860  ? ?Lipid Panel ?   ?Component Value Date/Time  ? CHOL 192 08/14/2010 0710  ? TRIG 165 (H) 08/14/2010 0710  ? HDL 46 08/14/2010 0710  ? CHOLHDL 4.2 08/14/2010 0710  ? VLDL 33 08/14/2010 0710  ? LDLCALC 113 (H) 08/14/2010 0710  ? ? ?Additional studies/ records that were reviewed today include:  ?Echocardiogram 06/06/2017: ?- Left ventricle: The cavity size was normal. Wall thickness was  ?  normal. Systolic function was normal. The estimated ejection  ?  fraction was in the range of 55% to 60%. Wall motion was normal;  ?  there were no regional wall motion abnormalities. Indeterminate  ?  diastolic function, possibly restrictive diastolic filling.  ?- Aortic valve: Mildly to moderately calcified annulus. Trileaflet.  ?- Mitral valve: Mildly calcified annulus. Mildly thickened  ?  leaflets. There was moderate regurgitation. PISA not obtained.  ?- Left atrium: The atrium was massively dilated.  ?- Right ventricle: Pacer wire or catheter noted in right ventricle.  ?- Right atrium: The atrium was severely dilated. Pacer wire or  ?  catheter noted in right atrium. Central venous pressure (est): 3  ?  mm Hg.  ?- Atrial septum: The septum bowed from left to  right, consistent  ?  with increased left atrial pressure. No defect or patent foramen  ?  ovale was identified.  ?- Tricuspid valve: There was mild-moderate regurgitation.  ?- Pulmonic valve: There was trivial regurgitation.  ?- Pulmonary arteries: PA peak pressure: 40 mm Hg (S).  ?- Pericardium, extracardiac: A small pericardial effusion was  ?  identified anterior to the heart with trivial effusion  ?  posteriorly.  ?  ? ? ?Risk Assessment/Calculations:   ? ?CHA2DS2-VASc Score = 4  ? This indicates a 4.8% annual risk of stroke. ?The patient's score is based  upon: ?CHF History: 0 ?HTN History: 1 ?Diabetes History: 0 ?Stroke History: 0 ?Vascular Disease History: 0 ?Age Score: 2 ?Gender Score: 1 ?  ? ? ? ? ? ?ASSESSMENT:   ? ?1. Permanent atrial fibrillation ([place])   ?2. Complete heart block ([place])   ?3. Essential hypertension   ?4. Hyperlipidemia, unspecified hyperlipidemia type   ? ? ? ?PLAN:  ?In order of problems listed above: ? ?PAF on Toprol and Xarelto 15 mg based on creatinine clearance-no bleeding problems and labs 02/2021 stable. ? ?CHB/sinus node dysfunction status post [facility] pacemaker followed by Dr. Jennell Corner functioning normally 03/07/2021 ? ?Hypertension BP well controlled on losartan ? ?HLD on atorvastatin. ? ?Shared Decision Making/Informed Consent   ?   ? ? ?Medication Adjustments/Labs and Tests Ordered: ?Current medicines are reviewed at length with the patient today.  Concerns regarding medicines are outlined above.  Medication changes, Labs and Tests ordered today are listed in the Patient Instructions below. ?Patient Instructions  ?Medication Instructions:  ?Your physician recommends that you continue on your current medications as directed. Please refer to the Current Medication list given to you today. ? ? ?Labwork: ?CBC [facility] BMET [facility] 4 months [facility] ? ?Testing/Procedures: ?None today ? ?Follow-Up: ?6 months ? ?Any Other Special Instructions Will Be Listed Below (If  Applicable). ? ?If you need a refill on your cardiac medications before your next appointment, please call your pharmacy. ?  ? ?Signed, ?Ermalinda Barrios, PA-C  ?04/24/2021 2:00 PM    ?[place] ?

## 2021-04-24 ENCOUNTER — Encounter: Payer: Self-pay | Admitting: Physician Assistant

## 2021-04-24 ENCOUNTER — Ambulatory Visit (INDEPENDENT_AMBULATORY_CARE_PROVIDER_SITE_OTHER): Payer: Medicare Other | Admitting: Physician Assistant

## 2021-04-24 VITALS — BP 126/64 | HR 70 | Ht 63.0 in | Wt 142.0 lb

## 2021-04-24 DIAGNOSIS — I442 Atrioventricular block, complete: Secondary | ICD-10-CM | POA: Diagnosis not present

## 2021-04-24 DIAGNOSIS — E785 Hyperlipidemia, unspecified: Secondary | ICD-10-CM | POA: Diagnosis not present

## 2021-04-24 DIAGNOSIS — I10 Essential (primary) hypertension: Secondary | ICD-10-CM | POA: Diagnosis not present

## 2021-04-24 DIAGNOSIS — I4821 Permanent atrial fibrillation: Secondary | ICD-10-CM

## 2021-04-24 NOTE — Patient Instructions (Signed)
Medication Instructions:  ?Your physician recommends that you continue on your current medications as directed. Please refer to the Current Medication list given to you today. ? ? ?Labwork: ?CBC and BMET in 4 months at [facility] ? ?Testing/Procedures: ?None today ? ?Follow-Up: ?6 months ? ?Any Other Special Instructions Will Be Listed Below (If Applicable). ? ?If you need a refill on your cardiac medications before your next appointment, please call your pharmacy. ? ?

## 2021-06-14 ENCOUNTER — Ambulatory Visit (INDEPENDENT_AMBULATORY_CARE_PROVIDER_SITE_OTHER): Payer: Medicare Other

## 2021-06-14 DIAGNOSIS — I442 Atrioventricular block, complete: Secondary | ICD-10-CM | POA: Diagnosis not present

## 2021-06-15 LAB — CUP PACEART REMOTE DEVICE CHECK
Battery Remaining Longevity: 56 mo
Battery Remaining Percentage: 54 %
Battery Voltage: 2.99 V
Brady Statistic AP VP Percent: 99 %
Brady Statistic AP VS Percent: 1 %
Brady Statistic AS VP Percent: 1 %
Brady Statistic AS VS Percent: 1 %
Brady Statistic RA Percent Paced: 99 %
Brady Statistic RV Percent Paced: 99 %
Date Time Interrogation Session: 20230607020013
Implantable Lead Implant Date: 20190524
Implantable Lead Implant Date: 20190524
Implantable Lead Location: 753859
Implantable Lead Location: 753860
Implantable Pulse Generator Implant Date: 20190524
Lead Channel Impedance Value: 440 Ohm
Lead Channel Impedance Value: 460 Ohm
Lead Channel Pacing Threshold Amplitude: 0.5 V
Lead Channel Pacing Threshold Amplitude: 0.625 V
Lead Channel Pacing Threshold Pulse Width: 0.5 ms
Lead Channel Pacing Threshold Pulse Width: 0.5 ms
Lead Channel Sensing Intrinsic Amplitude: 1.7 mV
Lead Channel Sensing Intrinsic Amplitude: 12 mV
Lead Channel Setting Pacing Amplitude: 0.875
Lead Channel Setting Pacing Amplitude: 2 V
Lead Channel Setting Pacing Pulse Width: 0.5 ms
Lead Channel Setting Sensing Sensitivity: 2 mV
Pulse Gen Model: 2272
Pulse Gen Serial Number: 9022503

## 2021-06-23 NOTE — Progress Notes (Signed)
Remote pacemaker transmission.   

## 2021-09-13 ENCOUNTER — Ambulatory Visit (INDEPENDENT_AMBULATORY_CARE_PROVIDER_SITE_OTHER): Payer: Medicare Other

## 2021-09-13 DIAGNOSIS — I442 Atrioventricular block, complete: Secondary | ICD-10-CM | POA: Diagnosis not present

## 2021-09-13 LAB — CUP PACEART REMOTE DEVICE CHECK
Battery Remaining Longevity: 53 mo
Battery Remaining Percentage: 51 %
Battery Voltage: 2.99 V
Brady Statistic AP VP Percent: 99 %
Brady Statistic AP VS Percent: 1 %
Brady Statistic AS VP Percent: 1 %
Brady Statistic AS VS Percent: 1 %
Brady Statistic RA Percent Paced: 99 %
Brady Statistic RV Percent Paced: 99 %
Date Time Interrogation Session: 20230906020015
Implantable Lead Implant Date: 20190524
Implantable Lead Implant Date: 20190524
Implantable Lead Location: 753859
Implantable Lead Location: 753860
Implantable Pulse Generator Implant Date: 20190524
Lead Channel Impedance Value: 430 Ohm
Lead Channel Impedance Value: 480 Ohm
Lead Channel Pacing Threshold Amplitude: 0.5 V
Lead Channel Pacing Threshold Amplitude: 0.625 V
Lead Channel Pacing Threshold Pulse Width: 0.5 ms
Lead Channel Pacing Threshold Pulse Width: 0.5 ms
Lead Channel Sensing Intrinsic Amplitude: 1.7 mV
Lead Channel Sensing Intrinsic Amplitude: 12 mV
Lead Channel Setting Pacing Amplitude: 0.875
Lead Channel Setting Pacing Amplitude: 2 V
Lead Channel Setting Pacing Pulse Width: 0.5 ms
Lead Channel Setting Sensing Sensitivity: 2 mV
Pulse Gen Model: 2272
Pulse Gen Serial Number: 9022503

## 2021-10-02 NOTE — Progress Notes (Signed)
Remote pacemaker transmission.   

## 2021-11-08 ENCOUNTER — Other Ambulatory Visit (HOSPITAL_COMMUNITY)
Admission: RE | Admit: 2021-11-08 | Discharge: 2021-11-08 | Disposition: A | Discharge status: Home / Self Care | Payer: Medicare Other | Source: Ambulatory Visit | Attending: Cardiology | Admitting: Cardiology

## 2021-11-08 DIAGNOSIS — I4821 Permanent atrial fibrillation: Secondary | ICD-10-CM | POA: Insufficient documentation

## 2021-11-08 LAB — BASIC METABOLIC PANEL
Anion gap: 7 (ref 5–15)
BUN: 10 mg/dL (ref 8–23)
CO2: 30 mmol/L (ref 22–32)
Calcium: 9.1 mg/dL (ref 8.9–10.3)
Chloride: 100 mmol/L (ref 98–111)
Creatinine, Ser: 0.82 mg/dL (ref 0.44–1.00)
GFR, Estimated: >60 mL/min (ref >60)
Glucose, Bld: 135 mg/dL — ABNORMAL HIGH (ref 70–99)
Potassium: 4.4 mmol/L (ref 3.5–5.1)
Sodium: 137 mmol/L (ref 135–145)

## 2021-11-08 LAB — CBC
HCT: 41.9 % (ref 36.0–46.0)
Hemoglobin: 13.3 g/dL (ref 12.0–15.0)
MCH: 29.9 pg (ref 26.0–34.0)
MCHC: 31.7 g/dL (ref 30.0–36.0)
MCV: 94.2 fL (ref 80.0–100.0)
Platelets: 191 10*3/uL (ref 150–400)
RBC: 4.45 MIL/uL (ref 3.87–5.11)
RDW: 13.5 % (ref 11.5–15.5)
WBC: 5.8 10*3/uL (ref 4.0–10.5)
nRBC: 0 % (ref 0.0–0.2)

## 2021-11-16 ENCOUNTER — Encounter: Payer: Self-pay | Admitting: Cardiology

## 2021-11-16 ENCOUNTER — Ambulatory Visit: Payer: Medicare Other | Attending: Cardiology | Admitting: Cardiology

## 2021-11-16 VITALS — BP 110/64 | HR 70 | Ht 64.0 in | Wt 140.2 lb

## 2021-11-16 DIAGNOSIS — I4819 Other persistent atrial fibrillation: Secondary | ICD-10-CM | POA: Insufficient documentation

## 2021-11-16 DIAGNOSIS — I10 Essential (primary) hypertension: Secondary | ICD-10-CM | POA: Insufficient documentation

## 2021-11-16 DIAGNOSIS — I442 Atrioventricular block, complete: Secondary | ICD-10-CM | POA: Insufficient documentation

## 2021-11-16 NOTE — Progress Notes (Signed)
Cardiology Office Note  Date: 11/16/2021   ID: Renee Cameron, DOB 01/21/1924, MRN [id number]  PCP:  Joycelyn Man, FNP  Cardiologist:  Rozann Lesches, MD Electrophysiologist:  Cristopher Peru, MD   Chief Complaint  Patient presents with   Cardiac follow-up    History of Present Illness: Renee Cameron is a 88 y.o. female last seen in April by Ms. Vita Barley, reviewed today.  She is here today with her daughter for a follow-up visit.  Continues to do reside at [place].  She does not report any new cardiac symptoms, specifically no chest pain, palpitations, or unusual shortness of breath.  St. Jude pacemaker in place with follow-up with Dr. Lovena Le.  Last device interrogation indicated normal function.  I reviewed her medications which are outlined below.  No obvious bleeding problems reported on Xarelto.  She may be in need of cataract surgery later in the year.  Past Medical History:  Diagnosis Date   Atrial fibrillation [facility])    Chronic anticoagulation    Complete heart block ([place])    a. s/p STJ PPM 05/2017.   Dementia [facility])    Essential hypertension    Hyperthyroidism 2000   2000   Osteoporosis     Past Surgical History:  Procedure Laterality Date   ABDOMINAL HYSTERECTOMY     APPENDECTOMY     COLONOSCOPY  2005   Dr. Laural Golden: few small diverticula. external hemorrhoids   PACEMAKER IMPLANT N/A 05/31/2017   Procedure: PACEMAKER IMPLANT;  Surgeon: Evans Lance, MD;  Location: [place] [facility];  Service: Cardiovascular;  Laterality: N/A;   TOTAL ABDOMINAL HYSTERECTOMY W/ BILATERAL SALPINGOOPHORECTOMY      Current Outpatient Medications  Medication Sig Dispense Refill   ALPRAZolam (XANAX) 0.25 MG tablet Take 0.25 mg by mouth 2 (two) times daily as needed.     atorvastatin (LIPITOR) 40 MG tablet Take 40 mg by mouth daily.     desvenlafaxine (PRISTIQ) 50 MG 24 hr tablet Take 1 tablet by mouth daily.     donepezil (ARICEPT) 5 MG tablet Take 5 mg by mouth at  bedtime.  2   furosemide (LASIX) 20 MG tablet Take 20 mg by mouth daily.     losartan (COZAAR) 25 MG tablet Take 25 mg by mouth daily.     MEGARED OMEGA-3 KRILL OIL 500 MG CAPS Take 500 mg by mouth daily at 12 noon.     memantine (NAMENDA) 10 MG tablet Take 1 tablet by mouth daily.     metoprolol succinate (TOPROL-XL) 12.5 mg TB24 24 hr tablet Take 12.5 mg by mouth daily.     Multiple Vitamins-Minerals (PRESERVISION AREDS 2+MULTI VIT) CAPS Take 2 capsules by mouth daily.     potassium chloride (K-DUR,KLOR-CON) 10 MEQ tablet Take 1 tablet by mouth daily.     XARELTO 15 MG TABS tablet TAKE 1 TABLET(15 MG) BY MOUTH DAILY WITH DINNER 30 tablet 6   No current facility-administered medications for this visit.   Allergies:  Patient has no known allergies.   ROS: Hearing loss, memory loss.  Physical Exam: VS:  BP 110/64   Pulse 70   Ht '5\' 4"'$  (1.626 m)   Wt 140 lb 3.2 oz (63.6 kg)   SpO2 98%   BMI 24.07 kg/m , BMI Body mass index is 24.07 kg/m.  Wt Readings from Last 3 Encounters:  11/16/21 140 lb 3.2 oz (63.6 kg)  04/24/21 142 lb (64.4 kg)  03/07/21 140 lb (63.5 kg)  General: Patient appears comfortable at rest. HEENT: Conjunctiva and lids normal. Neck: Supple, no elevated JVP or carotid bruits. Lungs: Clear to auscultation, nonlabored breathing at rest. Cardiac: Regular rate and rhythm, no S3, 2/6 systolic murmur. Extremities: No pitting edema.  ECG:  An ECG dated 02/11/2021 was personally reviewed today and demonstrated:  Ventricular paced rhythm with atrial tracking.  Recent Labwork: 02/11/2021: ALT 15; AST 23; Magnesium 2.1; TSH 4.860 11/08/2021: BUN 10; Creatinine, Ser 0.82; Hemoglobin 13.3; Platelets 191; Potassium 4.4; Sodium 137   Other Studies Reviewed Today:  No interval cardiac testing for review today.  Assessment and Plan:  1.  Permanent atrial fibrillation with CHA2DS2-VASc score of 4.  She remains on Xarelto for stroke prophylaxis, asymptomatic in terms of  palpitations and on low-dose Toprol-XL.  No changes were made today.  2.  Complete heart block with [employer]. Jude pacemaker in place and followed by Dr. Lovena Le.  3.  Essential hypertension, blood pressure low normal today.  She is on losartan in addition to Toprol-XL.  Medication Adjustments/Labs and Tests Ordered: Current medicines are reviewed at length with the patient today.  Concerns regarding medicines are outlined above.   Tests Ordered: Orders Placed This Encounter  Procedures   CBC   Basic metabolic panel    Medication Changes: No orders of the defined types were placed in this encounter.   Disposition:  Follow up  6 months.  Signed, Satira Sark, MD, [facility] 11/16/2021 2:15 PM    [facility] [address] [place], [place] [address] Phone: [phone]; Fax: [phone]

## 2021-11-16 NOTE — Patient Instructions (Signed)
Medication Instructions: Your physician recommends that you continue on your current medications as directed. Please refer to the Current Medication list given to you today.   Labwork: CBC,BMET in 6 months before next visit  Testing/Procedures: None today  Follow-Up: 6 months  Any Other Special Instructions Will Be Listed Below (If Applicable).  If you need a refill on your cardiac medications before your next appointment, please call your pharmacy.

## 2021-12-13 ENCOUNTER — Ambulatory Visit (INDEPENDENT_AMBULATORY_CARE_PROVIDER_SITE_OTHER): Payer: Medicare Other

## 2021-12-13 DIAGNOSIS — I442 Atrioventricular block, complete: Secondary | ICD-10-CM

## 2021-12-13 LAB — CUP PACEART REMOTE DEVICE CHECK
Battery Remaining Longevity: 50 mo
Battery Remaining Percentage: 48 %
Battery Voltage: 2.99 V
Brady Statistic AP VP Percent: 99 %
Brady Statistic AP VS Percent: 1 %
Brady Statistic AS VP Percent: 1 %
Brady Statistic AS VS Percent: 1 %
Brady Statistic RA Percent Paced: 99 %
Brady Statistic RV Percent Paced: 99 %
Date Time Interrogation Session: 20231206020013
Implantable Lead Connection Status: 753985
Implantable Lead Connection Status: 753985
Implantable Lead Implant Date: 20190524
Implantable Lead Implant Date: 20190524
Implantable Lead Location: 753859
Implantable Lead Location: 753860
Implantable Pulse Generator Implant Date: 20190524
Lead Channel Impedance Value: 430 Ohm
Lead Channel Impedance Value: 480 Ohm
Lead Channel Pacing Threshold Amplitude: 0.5 V
Lead Channel Pacing Threshold Amplitude: 0.625 V
Lead Channel Pacing Threshold Pulse Width: 0.5 ms
Lead Channel Pacing Threshold Pulse Width: 0.5 ms
Lead Channel Sensing Intrinsic Amplitude: 1.1 mV
Lead Channel Sensing Intrinsic Amplitude: 12 mV
Lead Channel Setting Pacing Amplitude: 0.875
Lead Channel Setting Pacing Amplitude: 2 V
Lead Channel Setting Pacing Pulse Width: 0.5 ms
Lead Channel Setting Sensing Sensitivity: 2 mV
Pulse Gen Model: 2272
Pulse Gen Serial Number: 9022503

## 2021-12-28 NOTE — H&P (Signed)
Surgical History & Physical  Patient Name: Renee Cameron DOB: 03-10-1924  Surgery: Cataract extraction with intraocular lens implant phacoemulsification; Right Eye  Surgeon: Baruch Goldmann MD Surgery Date:  01-12-22 Pre-Op Date:  12-21-21  HPI: A 89 Yr. old female patient 1. The patient is returning for a left wet AMD 6 months follow-up of both eyes with OCT macula, IOP check, Dilated OU, and discuss cataract surgery. Pt is getting injections in both eyes in [place]. Since the last visit, the affected area is doing well. The patient's vision is stable. Patient is following medication instructions. Patient is continuing to have blurry vision. Having more trouble seeing to do her activities where she lives. More problems with peripheral vision. This is negatively affecting the patient's quality of life and the patient is unable to function adequately in life with the current level of vision. HPI was performed by Baruch Goldmann .  Medical History: Macula Degeneration Cataracts Heart Problem High Blood Pressure LDL  Review of Systems Negative Allergic/Immunologic Negative Cardiovascular Negative Constitutional Negative Ear, Nose, Mouth & Throat Negative Endocrine Negative Eyes Negative Gastrointestinal Negative Genitourinary Negative Hemotologic/Lymphatic Negative Integumentary Negative Musculoskeletal Negative Neurological Negative Psychiatry Negative Respiratory  Social   Never smoked   Medication Ocuvite Eye Plus, Systane Complete,  Zofran, Coumadin, Atorvastatin, Klor-Con, Fish Oil, Omega-3, Ondansetron, Ranitidine, Aricept, Cholecalciferol, Lasix, Losartan, Memantine, Metoprolol, Potassium chloride, Pristiq, Vitamin D2, Xarelto, Xanax, Tylenol, Aricept,   Sx/Procedures  Tubal Ligation, Heart surgery- pacemaker,   Drug Allergies   NKDA  History & Physical: Heent: Cataract, right eye NECK: supple without bruits LUNGS: lungs clear to auscultation CV: regular  rate and rhythm Abdomen: soft and non-tender Impression & Plan: Assessment: 1.  COMBINED FORMS AGE RELATED CATARACT; Both Eyes (H25.813) 2.  Age-realeted Macular Degeneration, WET; Left Eye Active neovascularization (H35.3221) 3.  AGE-RELATED MACULAR DEGENERATION DRY; Right Eye Adv atrophic w/o subfoveal Involvement (H35.3113) 4.  BLEPHARITIS; Right Upper Lid, Right Lower Lid, Left Upper Lid, Left Lower Lid (H01.001, H01.002,H01.004,H01.005) 5.  DERMATOCHALASIS, no surgery; Right Upper Lid, Left Upper Lid (H02.831, H02.834) 6.  Pinguecula; Both Eyes (H11.153) 7.  Pseudoexfoliation, Glaucoma; Both Eyes Indeterminate (Z61.0960)  Plan: 1.  Cataract accounts for the patient's decreased vision. This visual impairment is not correctable with a tolerable change in glasses or contact lenses. Cataract surgery with an implantation of a new lens should significantly improve the visual and functional status of the patient. Discussed all risks, benefits, alternatives, and potential complications. Discussed the procedures and recovery. Patient desires to have surgery. A-scan ordered and performed today for intra-ocular lens calculations. The surgery will be performed in order to improve vision for driving, reading, and for eye examinations. Recommend phacoemulsification with intra-ocular lens. Recommend Dextenza for post-operative pain and inflammation. Right Eye worse - first. Dilates poorly - shugarcaine by protocol. Malyugin Ring. Omidira.  2.  Under the care of Dr. Jule Ser.  3.  Start AREDS 2 vitamins. referral as above.  4.  Recommend regular lid cleaning  5.  Asymptomatic, recommend observation for now. Findings, prognosis and treatment options reviewed.  6.  Observe; Artificial tears as needed for irritation.  7.  Large CD ratio OU. OCT rNFL today. 05/04/21 IOPs WNL OU today. Will address after retinal at cataract treatment.

## 2022-01-05 ENCOUNTER — Encounter (HOSPITAL_COMMUNITY): Payer: Self-pay

## 2022-01-05 ENCOUNTER — Encounter (HOSPITAL_COMMUNITY)
Admission: RE | Admit: 2022-01-05 | Discharge: 2022-01-05 | Disposition: A | Discharge status: Home / Self Care | Payer: Medicare Other | Source: Ambulatory Visit | Attending: Ophthalmology | Admitting: Ophthalmology

## 2022-01-05 HISTORY — DX: Presence of cardiac pacemaker: Z95.0

## 2022-01-05 HISTORY — DX: Depression, unspecified: F32.A

## 2022-01-05 HISTORY — DX: Anxiety disorder, unspecified: F41.9

## 2022-01-05 NOTE — Progress Notes (Signed)
Remote pacemaker transmission.   

## 2022-01-12 ENCOUNTER — Ambulatory Visit (HOSPITAL_COMMUNITY)
Admission: RE | Admit: 2022-01-12 | Discharge: 2022-01-12 | Disposition: A | Discharge status: Home / Self Care | Payer: Medicare Other | Attending: Ophthalmology | Admitting: Ophthalmology

## 2022-01-12 ENCOUNTER — Ambulatory Visit (HOSPITAL_COMMUNITY): Payer: Medicare Other | Admitting: Anesthesiology

## 2022-01-12 ENCOUNTER — Encounter (HOSPITAL_COMMUNITY)
Admission: RE | Disposition: A | Discharge status: Home / Self Care | Payer: Self-pay | Source: Home / Self Care | Attending: Ophthalmology

## 2022-01-12 DIAGNOSIS — H2181 Floppy iris syndrome: Secondary | ICD-10-CM | POA: Insufficient documentation

## 2022-01-12 DIAGNOSIS — F419 Anxiety disorder, unspecified: Secondary | ICD-10-CM | POA: Insufficient documentation

## 2022-01-12 DIAGNOSIS — I4891 Unspecified atrial fibrillation: Secondary | ICD-10-CM

## 2022-01-12 DIAGNOSIS — H0100A Unspecified blepharitis right eye, upper and lower eyelids: Secondary | ICD-10-CM | POA: Diagnosis not present

## 2022-01-12 DIAGNOSIS — H401434 Capsular glaucoma with pseudoexfoliation of lens, bilateral, indeterminate stage: Secondary | ICD-10-CM | POA: Insufficient documentation

## 2022-01-12 DIAGNOSIS — F32A Depression, unspecified: Secondary | ICD-10-CM | POA: Insufficient documentation

## 2022-01-12 DIAGNOSIS — I10 Essential (primary) hypertension: Secondary | ICD-10-CM | POA: Insufficient documentation

## 2022-01-12 DIAGNOSIS — Z79899 Other long term (current) drug therapy: Secondary | ICD-10-CM | POA: Diagnosis not present

## 2022-01-12 DIAGNOSIS — H25813 Combined forms of age-related cataract, bilateral: Secondary | ICD-10-CM | POA: Insufficient documentation

## 2022-01-12 DIAGNOSIS — H11153 Pinguecula, bilateral: Secondary | ICD-10-CM | POA: Diagnosis not present

## 2022-01-12 DIAGNOSIS — H0100B Unspecified blepharitis left eye, upper and lower eyelids: Secondary | ICD-10-CM | POA: Insufficient documentation

## 2022-01-12 DIAGNOSIS — H25811 Combined forms of age-related cataract, right eye: Secondary | ICD-10-CM | POA: Diagnosis not present

## 2022-01-12 DIAGNOSIS — H02834 Dermatochalasis of left upper eyelid: Secondary | ICD-10-CM | POA: Diagnosis not present

## 2022-01-12 DIAGNOSIS — F418 Other specified anxiety disorders: Secondary | ICD-10-CM

## 2022-01-12 DIAGNOSIS — H353221 Exudative age-related macular degeneration, left eye, with active choroidal neovascularization: Secondary | ICD-10-CM | POA: Insufficient documentation

## 2022-01-12 DIAGNOSIS — H353113 Nonexudative age-related macular degeneration, right eye, advanced atrophic without subfoveal involvement: Secondary | ICD-10-CM | POA: Insufficient documentation

## 2022-01-12 DIAGNOSIS — H02831 Dermatochalasis of right upper eyelid: Secondary | ICD-10-CM | POA: Diagnosis not present

## 2022-01-12 DIAGNOSIS — Z950 Presence of cardiac pacemaker: Secondary | ICD-10-CM | POA: Insufficient documentation

## 2022-01-12 HISTORY — PX: CATARACT EXTRACTION W/PHACO: SHX586

## 2022-01-12 SURGERY — PHACOEMULSIFICATION, CATARACT, WITH IOL INSERTION
Anesthesia: Monitor Anesthesia Care | Site: Eye | Laterality: Right

## 2022-01-12 MED ORDER — BSS IO SOLN
INTRAOCULAR | Status: DC | PRN
  Administered 2022-01-12: 15 mL via INTRAOCULAR

## 2022-01-12 MED ORDER — EPINEPHRINE PF 1 MG/ML IJ SOLN
INTRAMUSCULAR | Status: AC
  Filled 2022-01-12: qty 2

## 2022-01-12 MED ORDER — STERILE WATER FOR IRRIGATION IR SOLN
Status: DC | PRN
  Administered 2022-01-12: 25 mL

## 2022-01-12 MED ORDER — PHENYLEPHRINE-KETOROLAC 1-0.3 % IO SOLN
INTRAOCULAR | Status: DC | PRN
  Administered 2022-01-12: 500 mL via OPHTHALMIC

## 2022-01-12 MED ORDER — MOXIFLOXACIN HCL 0.5 % OP SOLN
OPHTHALMIC | Status: DC | PRN
  Administered 2022-01-12: .3 mL

## 2022-01-12 MED ORDER — PHENYLEPHRINE-KETOROLAC 1-0.3 % IO SOLN
INTRAOCULAR | Status: AC
  Filled 2022-01-12: qty 4

## 2022-01-12 MED ORDER — LIDOCAINE HCL 3.5 % OP GEL
1.0000 | Freq: Once | OPHTHALMIC | Status: AC
  Administered 2022-01-12: 1 via OPHTHALMIC

## 2022-01-12 MED ORDER — TROPICAMIDE 1 % OP SOLN
1.0000 [drp] | OPHTHALMIC | Status: AC | PRN
  Administered 2022-01-12 (×3): 1 [drp] via OPHTHALMIC

## 2022-01-12 MED ORDER — TETRACAINE HCL 0.5 % OP SOLN
1.0000 [drp] | OPHTHALMIC | Status: AC | PRN
  Administered 2022-01-12 (×3): 1 [drp] via OPHTHALMIC

## 2022-01-12 MED ORDER — SODIUM HYALURONATE 10 MG/ML IO SOLUTION
PREFILLED_SYRINGE | INTRAOCULAR | Status: DC | PRN
  Administered 2022-01-12: .85 mL via INTRAOCULAR

## 2022-01-12 MED ORDER — LIDOCAINE HCL (PF) 1 % IJ SOLN
INTRAOCULAR | Status: DC | PRN
  Administered 2022-01-12: 1 mL via OPHTHALMIC

## 2022-01-12 MED ORDER — MIDAZOLAM HCL 2 MG/2ML IJ SOLN
INTRAMUSCULAR | Status: AC
  Filled 2022-01-12: qty 2

## 2022-01-12 MED ORDER — MOXIFLOXACIN HCL 5 MG/ML IO SOLN
INTRAOCULAR | Status: AC
  Filled 2022-01-12: qty 1

## 2022-01-12 MED ORDER — SIGHTPATH DOSE#1 SODIUM HYALURONATE 10 MG/ML IO SOLUTION
PREFILLED_SYRINGE | INTRAOCULAR | Status: DC | PRN
  Administered 2022-01-12: .85 mL via INTRAOCULAR

## 2022-01-12 MED ORDER — POVIDONE-IODINE 5 % OP SOLN
OPHTHALMIC | Status: DC | PRN
  Administered 2022-01-12: 1 via OPHTHALMIC

## 2022-01-12 MED ORDER — PHENYLEPHRINE HCL 2.5 % OP SOLN
1.0000 [drp] | OPHTHALMIC | Status: AC | PRN
  Administered 2022-01-12 (×3): 1 [drp] via OPHTHALMIC

## 2022-01-12 MED ORDER — SODIUM HYALURONATE 23MG/ML IO SOSY
PREFILLED_SYRINGE | INTRAOCULAR | Status: DC | PRN
  Administered 2022-01-12: .6 mL via INTRAOCULAR

## 2022-01-12 SURGICAL SUPPLY — 17 items
CATARACT SUITE SIGHTPATH (MISCELLANEOUS) ×1 IMPLANT
CLOTH BEACON ORANGE TIMEOUT ST (SAFETY) ×1 IMPLANT
EYE SHIELD UNIVERSAL CLEAR (GAUZE/BANDAGES/DRESSINGS) IMPLANT
FEE CATARACT SUITE SIGHTPATH (MISCELLANEOUS) ×1 IMPLANT
GLOVE BIOGEL PI IND STRL 7.0 (GLOVE) ×2 IMPLANT
GLOVE SS BIOGEL STRL SZ 6.5 (GLOVE) IMPLANT
LENS IOL RAYNER 22.0 (Intraocular Lens) ×1 IMPLANT
LENS IOL RAYONE EMV 22.0 (Intraocular Lens) IMPLANT
NDL HYPO 18GX1.5 BLUNT FILL (NEEDLE) ×1 IMPLANT
NEEDLE HYPO 18GX1.5 BLUNT FILL (NEEDLE) ×1 IMPLANT
PAD ARMBOARD 7.5X6 YLW CONV (MISCELLANEOUS) ×1 IMPLANT
RING MALYGIN 7.0 (MISCELLANEOUS) IMPLANT
SYR TB 1ML LL NO SAFETY (SYRINGE) ×1 IMPLANT
TAPE SURG TRANSPORE 1 IN (GAUZE/BANDAGES/DRESSINGS) IMPLANT
TAPE SURGICAL TRANSPORE 1 IN (GAUZE/BANDAGES/DRESSINGS) ×1
TIP IRRIGATON/ASPIRATION (MISCELLANEOUS) IMPLANT
WATER STERILE IRR 250ML POUR (IV SOLUTION) ×1 IMPLANT

## 2022-01-12 NOTE — Interval H&P Note (Signed)
History and Physical Interval Note:  01/12/2022 8:48 AM  Renee Cameron  has presented today for surgery, with the diagnosis of combined forms age related cataract; right.  The various methods of treatment have been discussed with the patient and family. After consideration of risks, benefits and other options for treatment, the patient has consented to  Procedure(s) with comments: CATARACT EXTRACTION PHACO AND INTRAOCULAR LENS PLACEMENT (IOC) (Right) - CDE: as a surgical intervention.  The patient's history has been reviewed, patient examined, no change in status, stable for surgery.  I have reviewed the patient's chart and labs.  Questions were answered to the patient's satisfaction.     Baruch Goldmann

## 2022-01-12 NOTE — Transfer of Care (Signed)
Immediate Anesthesia Transfer of Care Note  Patient: Renee Cameron  Procedure(s) Performed: CATARACT EXTRACTION PHACO AND INTRAOCULAR LENS PLACEMENT (IOC) (Right: Eye)  Patient Location: PACU  Anesthesia Type:MAC  Level of Consciousness: awake  Airway & Oxygen Therapy: Patient Spontanous Breathing  Post-op Assessment: Report given to RN and Post -op Vital signs reviewed and stable  Post vital signs: Reviewed and stable  Last Vitals:  Vitals Value Taken Time  BP    Temp    Pulse    Resp    SpO2      Last Pain:  Vitals:   01/12/22 0830  PainSc: 0-No pain         Complications: No notable events documented.

## 2022-01-12 NOTE — Anesthesia Preprocedure Evaluation (Signed)
Anesthesia Evaluation  Patient identified by MRN, date of birth, ID band Patient awake    Reviewed: Allergy & Precautions, H&P , NPO status , Patient's Chart, lab work & pertinent test results, reviewed documented beta blocker date and time   Airway Mallampati: II  TM Distance: >3 FB Neck ROM: Full    Dental  (+) Dental Advisory Given, Partial Lower, Caps   Pulmonary pneumonia   Pulmonary exam normal breath sounds clear to auscultation       Cardiovascular hypertension, Pt. on medications and Pt. on home beta blockers + dysrhythmias Atrial Fibrillation + pacemaker  Rhythm:Regular Rate:Normal     Neuro/Psych  PSYCHIATRIC DISORDERS Anxiety Depression   Dementia negative neurological ROS     GI/Hepatic negative GI ROS, Neg liver ROS,,,  Endo/Other   Hyperthyroidism   Renal/GU negative Renal ROS  negative genitourinary   Musculoskeletal negative musculoskeletal ROS (+)    Abdominal   Peds negative pediatric ROS (+)  Hematology  (+) Blood dyscrasia, anemia   Anesthesia Other Findings   Reproductive/Obstetrics negative OB ROS                             Anesthesia Physical Anesthesia Plan  ASA: 3  Anesthesia Plan: MAC   Post-op Pain Management: Minimal or no pain anticipated   Induction:   PONV Risk Score and Plan: Treatment may vary due to age or medical condition  Airway Management Planned: Nasal Cannula and Natural Airway  Additional Equipment:   Intra-op Plan:   Post-operative Plan:   Informed Consent: I have reviewed the patients History and Physical, chart, labs and discussed the procedure including the risks, benefits and alternatives for the proposed anesthesia with the patient or authorized representative who has indicated his/her understanding and acceptance.       Plan Discussed with: CRNA and Surgeon  Anesthesia Plan Comments:         Anesthesia Quick  Evaluation

## 2022-01-12 NOTE — Op Note (Signed)
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 Gretta Samons, MD, MA  Anesthesia: MAC, Topical Akten  Complications: None  Estimated Blood Loss: <49m (minimal)  Specimens: None  Implants: As above  Indications:  Visually significant cataract, Right Eye  Procedure:  The patient was seen and identified in the pre-operative area. The operative eye was identified and dilated.  The operative eye was marked.  Topical anesthesia was administered to the operative eye.     The patient was then to the operative suite and placed in the supine position.  A timeout was performed confirming the patient, procedure to be performed, and all other relevant information.   The patient's face was prepped and draped in the usual fashion for intra-ocular surgery.  A lid speculum was placed into the operative eye and the surgical microscope moved into place and focused.  Poor dilation of the iris was confirmed.  A superotemporal paracentesis was created using a 20 gauge paracentesis blade.  Shugarcaine was injected into the anterior chamber.  Viscoelastic was injected into the anterior chamber.  A temporal clear-corneal main wound incision was created using a 2.428mmicrokeratome.  A Malyugin ring was placed.  A continuous curvilinear capsulorrhexis was initiated using an irrigating cystitome and completed using capsulorrhexis forceps.  Hydrodissection and hydrodeliniation were performed.  Viscoelastic was injected into the anterior chamber.  A  phacoemulsification handpiece and a chopper as a second instrument were used to remove the nucleus and epinucleus. The irrigation/aspiration handpiece was used to remove any remaining cortical material.   The capsular bag was reinflated with viscoelastic, checked, and found to be intact.  The intraocular lens was inserted into the capsular bag and dialed into place using a MaSurveyor, minerals The Malyugin ring was removed.  The irrigation/aspiration handpiece was used to remove any remaining viscoelastic.  The clear corneal wound and paracentesis wounds were then hydrated and checked with Weck-Cels to be watertight. 0.74m17mf moxifloxacin was injected into the anterior chamber.  The lid-speculum and drape was removed, and the patient's face was cleaned with a wet and dry 4x4. A clear shield was taped over the eye. The patient was taken to the post-operative care unit in good condition, having tolerated the procedure well.  Post-Op Instructions: The patient will follow up at [facility] a same day post-operative evaluation and will receive all other orders and instructions.

## 2022-01-12 NOTE — Discharge Instructions (Signed)
Please discharge patient when stable, will follow up today with Dr. Korbin Mapps at the [facility] office immediately following discharge.  Leave shield in place until visit.  All paperwork with discharge instructions will be given at the office.  [facility] Address:  [address]  [place], [place] [address]  

## 2022-01-12 NOTE — Anesthesia Postprocedure Evaluation (Signed)
Anesthesia Post Note  Patient: Renee Cameron  Procedure(s) Performed: CATARACT EXTRACTION PHACO AND INTRAOCULAR LENS PLACEMENT (IOC) (Right: Eye)  Patient location during evaluation: Phase II Anesthesia Type: MAC Level of consciousness: awake and alert and oriented Pain management: pain level controlled Vital Signs Assessment: post-procedure vital signs reviewed and stable Respiratory status: spontaneous breathing, nonlabored ventilation and respiratory function stable Cardiovascular status: blood pressure returned to baseline and stable Postop Assessment: no apparent nausea or vomiting Anesthetic complications: no  No notable events documented.   Last Vitals:  Vitals:   01/12/22 0830 01/12/22 0928  BP: (!) 146/73 (!) 145/74  Pulse:  72  Resp: (!) 22 20  Temp: 37.1 C 37 C  SpO2: 99% 96%    Last Pain:  Vitals:   01/12/22 0928  TempSrc: Oral  PainSc: 0-No pain                 Ronny Korff C Rowan Pollman

## 2022-01-16 ENCOUNTER — Encounter (HOSPITAL_COMMUNITY): Payer: Self-pay | Admitting: Ophthalmology

## 2022-01-23 ENCOUNTER — Encounter (HOSPITAL_COMMUNITY): Payer: Medicare Other

## 2022-01-26 ENCOUNTER — Ambulatory Visit: Admit: 2022-01-26 | Payer: Medicare Other | Admitting: Ophthalmology

## 2022-01-26 SURGERY — PHACOEMULSIFICATION, CATARACT, WITH IOL INSERTION
Anesthesia: Monitor Anesthesia Care | Laterality: Left

## 2022-03-07 ENCOUNTER — Emergency Department (HOSPITAL_COMMUNITY)
Admission: EM | Admit: 2022-03-07 | Discharge: 2022-03-07 | Disposition: A | Discharge status: Skilled Nursing Facility | Payer: Medicare Other | Attending: Emergency Medicine | Admitting: Emergency Medicine

## 2022-03-07 ENCOUNTER — Encounter (HOSPITAL_COMMUNITY): Payer: Self-pay

## 2022-03-07 ENCOUNTER — Emergency Department (HOSPITAL_COMMUNITY): Payer: Medicare Other

## 2022-03-07 ENCOUNTER — Telehealth (HOSPITAL_COMMUNITY): Payer: Self-pay | Admitting: Student

## 2022-03-07 ENCOUNTER — Other Ambulatory Visit: Payer: Self-pay

## 2022-03-07 DIAGNOSIS — S60021A Contusion of right index finger without damage to nail, initial encounter: Secondary | ICD-10-CM | POA: Diagnosis not present

## 2022-03-07 DIAGNOSIS — I10 Essential (primary) hypertension: Secondary | ICD-10-CM | POA: Insufficient documentation

## 2022-03-07 DIAGNOSIS — S60022A Contusion of left index finger without damage to nail, initial encounter: Secondary | ICD-10-CM | POA: Insufficient documentation

## 2022-03-07 DIAGNOSIS — S0003XA Contusion of scalp, initial encounter: Secondary | ICD-10-CM | POA: Diagnosis not present

## 2022-03-07 DIAGNOSIS — S60012A Contusion of left thumb without damage to nail, initial encounter: Secondary | ICD-10-CM | POA: Diagnosis not present

## 2022-03-07 DIAGNOSIS — W0110XA Fall on same level from slipping, tripping and stumbling with subsequent striking against unspecified object, initial encounter: Secondary | ICD-10-CM | POA: Insufficient documentation

## 2022-03-07 DIAGNOSIS — R519 Headache, unspecified: Secondary | ICD-10-CM | POA: Diagnosis present

## 2022-03-07 DIAGNOSIS — M545 Low back pain, unspecified: Secondary | ICD-10-CM | POA: Diagnosis not present

## 2022-03-07 DIAGNOSIS — W19XXXA Unspecified fall, initial encounter: Secondary | ICD-10-CM

## 2022-03-07 DIAGNOSIS — Z7901 Long term (current) use of anticoagulants: Secondary | ICD-10-CM | POA: Diagnosis not present

## 2022-03-07 LAB — CBC WITH DIFFERENTIAL/PLATELET
Abs Immature Granulocytes: 0.03 10*3/uL (ref 0.00–0.07)
Basophils Absolute: 0 10*3/uL (ref 0.0–0.1)
Basophils Relative: 1 %
Eosinophils Absolute: 0.1 10*3/uL (ref 0.0–0.5)
Eosinophils Relative: 2 %
HCT: 41.3 % (ref 36.0–46.0)
Hemoglobin: 13.1 g/dL (ref 12.0–15.0)
Immature Granulocytes: 1 %
Lymphocytes Relative: 31 %
Lymphs Abs: 1.9 10*3/uL (ref 0.7–4.0)
MCH: 30 pg (ref 26.0–34.0)
MCHC: 31.7 g/dL (ref 30.0–36.0)
MCV: 94.5 fL (ref 80.0–100.0)
Monocytes Absolute: 0.5 10*3/uL (ref 0.1–1.0)
Monocytes Relative: 8 %
Neutro Abs: 3.7 10*3/uL (ref 1.7–7.7)
Neutrophils Relative %: 57 %
Platelets: 176 10*3/uL (ref 150–400)
RBC: 4.37 MIL/uL (ref 3.87–5.11)
RDW: 13.7 % (ref 11.5–15.5)
WBC: 6.3 10*3/uL (ref 4.0–10.5)
nRBC: 0 % (ref 0.0–0.2)

## 2022-03-07 LAB — BASIC METABOLIC PANEL
Anion gap: 11 (ref 5–15)
BUN: 11 mg/dL (ref 8–23)
CO2: 28 mmol/L (ref 22–32)
Calcium: 8.8 mg/dL — ABNORMAL LOW (ref 8.9–10.3)
Chloride: 98 mmol/L (ref 98–111)
Creatinine, Ser: 0.85 mg/dL (ref 0.44–1.00)
GFR, Estimated: >60 mL/min (ref >60)
Glucose, Bld: 129 mg/dL — ABNORMAL HIGH (ref 70–99)
Potassium: 3.7 mmol/L (ref 3.5–5.1)
Sodium: 137 mmol/L (ref 135–145)

## 2022-03-07 MED ORDER — ACETAMINOPHEN 325 MG PO TABS: 650.0000 mg | ORAL_TABLET | Freq: Four times a day (QID) | ORAL | 0 refills | Status: DC | PRN

## 2022-03-07 MED ORDER — ACETAMINOPHEN 500 MG PO TABS
1000.0000 mg | ORAL_TABLET | Freq: Once | ORAL | Status: AC
  Administered 2022-03-07: 1000 mg via ORAL
  Filled 2022-03-07: qty 2

## 2022-03-07 NOTE — Discharge Instructions (Signed)
You were seen in the emergency department today after a fall. The CT scan of your head and neck are normal as well as your labs and EKG. You may use Tylenol every 6 hours as needed for pain.  Please also continue to ice multiple times a day for about 15 to 20 minutes at a time.  This may be helpful for at least the next week. Please return to the emergency department for worsening mental status like confusion, vomiting, lethargy.

## 2022-03-07 NOTE — ED Triage Notes (Signed)
EMS called out to facility for patient falling. Patient tripped over her own feet and hit the back of her head on the side rail corner of her bed. Patient is crying out in pain saying "my head." Per EMS patient is not on blood thinners and CBG 129.

## 2022-03-07 NOTE — ED Notes (Signed)
Applied ice pack to the back of pt's head.

## 2022-03-07 NOTE — ED Notes (Signed)
Patient transported to CT 

## 2022-03-07 NOTE — Telephone Encounter (Signed)
Subsequent encounter open to change pharmacy for prescription.

## 2022-03-07 NOTE — ED Provider Notes (Signed)
[place] EMERGENCY DEPARTMENT AT [facility] Provider Note   CSN: [id number] Arrival date & time: 03/07/22  1347     History  Chief Complaint  Patient presents with   Renee Cameron is a 89 y.o. female.  With past medical history of atrial fibrillation anticoagulated on Xarelto, hypertension, complete heart block who presents to the emergency department with a fall.  Presents from facility with reported mechanical fall that was witnessed.  Apparently the patient tripped over her feet, falling and striking the back of her head on her bed rail.  The patient denies losing consciousness.  She is complaining of severe pain to the back of her head.  She is denying neck pain.  She denies changes to her vision or nausea or vomiting.  She denies having any chest pain or shortness of breath.    Shardia-Allison, med tech, at [facility] that this was an unwitnessed event. She states that another resident came running down the hall yelling that she had fallen. Unclear if she lost consciousness prior to falling. Deny vomiting since the event.    HPI     Home Medications Prior to Admission medications   Medication Sig Start Date End Date Taking? Authorizing Provider  acetaminophen (TYLENOL) 325 MG tablet Take 2 tablets (650 mg total) by mouth every 6 (six) hours as needed. 03/07/22  Yes Cristopher Peru, PA-C  ALPRAZolam (XANAX) 0.25 MG tablet Take 0.25 mg by mouth 2 (two) times daily as needed. 10/02/19   [provider]  atorvastatin (LIPITOR) 40 MG tablet Take 40 mg by mouth daily.    [provider]  desvenlafaxine (PRISTIQ) 50 MG 24 hr tablet Take 1 tablet by mouth daily. 05/18/17   [provider]  donepezil (ARICEPT) 5 MG tablet Take 5 mg by mouth at bedtime. 04/09/17   [provider]  furosemide (LASIX) 20 MG tablet Take 20 mg by mouth daily.    [provider]  losartan (COZAAR) 25 MG tablet Take 25 mg by  mouth daily. 07/21/20   [provider]  MEGARED OMEGA-3 KRILL OIL 500 MG CAPS Take 500 mg by mouth daily at 12 noon.    [provider]  memantine (NAMENDA) 10 MG tablet Take 1 tablet by mouth daily. 04/20/17   [provider]  metoprolol succinate (TOPROL-XL) 12.5 mg TB24 24 hr tablet Take 12.5 mg by mouth daily.    [provider]  Multiple Vitamins-Minerals (PRESERVISION AREDS 2+MULTI VIT) CAPS Take 2 capsules by mouth daily.    [provider]  potassium chloride (K-DUR,KLOR-CON) 10 MEQ tablet Take 1 tablet by mouth daily. 09/07/17   [provider]  XARELTO 15 MG TABS tablet TAKE 1 TABLET(15 MG) BY MOUTH DAILY WITH DINNER 10/30/16   Laqueta Linden, MD      Allergies    Patient has no known allergies.    Review of Systems   Review of Systems  Skin:  Positive for wound.  All other systems reviewed and are negative.   Physical Exam Updated Vital Signs BP (!) 153/83   Pulse 69   Temp 97.7 F (36.5 C) (Oral)   Resp 18   Ht 5\' 4"  (1.626 m)   Wt 63.5 kg   SpO2 95%   BMI 24.03 kg/m  Physical Exam Vitals and nursing note reviewed.  Constitutional:      General: She is not in acute distress.    Appearance: She is  not ill-appearing or toxic-appearing.  HENT:     Head: Normocephalic. Contusion present.     Comments: Is a contusion to the right parietal occipital scalp    Mouth/Throat:     Mouth: Mucous membranes are moist.     Pharynx: Oropharynx is clear.  Eyes:     General: No scleral icterus.    Extraocular Movements: Extraocular movements intact.     Pupils: Pupils are equal, round, and reactive to light.  Cardiovascular:     Rate and Rhythm: Normal rate and regular rhythm.     Pulses: Normal pulses.  Pulmonary:     Effort: Pulmonary effort is normal. No respiratory distress.  Abdominal:     Palpations: Abdomen is soft.  Musculoskeletal:        General: Normal range of motion.     Cervical back: Normal range  of motion and neck supple. No tenderness.  Skin:    General: Skin is warm and dry.     Findings: Bruising present. No rash.     Comments: Bruising to the left index finger and thumb, right index finger.  Normal range of motion.  There is no joint swelling or deformity.  He has some tenderness to palpation of the left buttock without bruising.  There is no spinal bony tenderness.  Neurological:     General: No focal deficit present.     Mental Status: She is alert.     Cranial Nerves: Cranial nerves 2-12 are intact.     Motor: Motor function is intact.     Comments: Unclear mental status baseline.  She has mild cognitive impairment in her history.  She is alert to name, place and is aware that her birthday just past but is unable to tell me the year.  Psychiatric:        Mood and Affect: Mood normal.        Behavior: Behavior normal.        Thought Content: Thought content normal.        Judgment: Judgment normal.     ED Results / Procedures / Treatments   Labs (all labs ordered are listed, but only abnormal results are displayed) Labs Reviewed  BASIC METABOLIC PANEL - Abnormal; Notable for the following components:      Result Value   Glucose, Bld 129 (*)    Calcium 8.8 (*)    All other components within normal limits  CBC WITH DIFFERENTIAL/PLATELET    EKG EKG Interpretation  Date/Time:  Wednesday March 07 2022 14:39:42 EST Ventricular Rate:  70 PR Interval:  188 QRS Duration: 170 QT Interval:  472 QTC Calculation: 509 R Axis:   261 Text Interpretation: AV dual-paced rhythm Abnormal ECG When compared with ECG of 11-Feb-2021 11:39, PREVIOUS ECG IS PRESENT Confirmed by Alona Bene [phone]) on 03/07/2022 2:52:55 PM  Radiology CT Head Wo Contrast  Result Date: 03/07/2022 CLINICAL DATA:  Fall and hit back of head. EXAM: CT HEAD WITHOUT CONTRAST CT CERVICAL SPINE WITHOUT CONTRAST TECHNIQUE: Multidetector CT imaging of the head and cervical spine was performed following the  standard protocol without intravenous contrast. Multiplanar CT image reconstructions of the cervical spine were also generated. RADIATION DOSE REDUCTION: This exam was performed according to the departmental dose-optimization program which includes automated exposure control, adjustment of the mA and/or kV according to patient size and/or use of iterative reconstruction technique. COMPARISON:  Cervical spine radiographs 09/18/2005 FINDINGS: CT HEAD FINDINGS Brain: There is no acute intracranial hemorrhage, extra-axial fluid collection, or  acute infarct. Parenchymal volume is within expected limits for age. The ventricles are normal in size. Gray-white differentiation is preserved patchy hypodensity throughout the supratentorial white matter likely reflects sequela of underlying chronic small-vessel ischemic change There is no mass lesion there is no mass effect or midline shift. The pituitary and suprasellar region are normal. Vascular: There is calcification of the bilateral carotid siphons. Skull: Normal. Negative for fracture or focal lesion. Sinuses/Orbits: The imaged paranasal sinuses are clear. A right lens implant is noted. The globes and orbits are otherwise unremarkable. Other: There is midline parietal scalp swelling. CT CERVICAL SPINE FINDINGS Alignment: There is mild anterolisthesis of C3 on C4, C4 on C5, and and C7 on T1, likely degenerative in nature. There is no jumped or perched facet or other evidence of traumatic malalignment. Skull base and vertebrae: Skull base alignment is maintained. Vertebral body heights are preserved. There is no evidence of acute fracture. There is no suspicious osseous lesion. Soft tissues and spinal canal: No prevertebral fluid or swelling. No visible canal hematoma. Disc levels: There is degenerative change of the C1-C2 articulation with prominent cystic change of the dens and surrounding degenerative pannus. There is disc space narrowing and degenerative endplate change  most advanced at C5-C6 and C6-C7. There is no evidence of high-grade spinal canal stenosis. Upper chest: The imaged lung apices are clear. Other: None. IMPRESSION: 1. No acute intracranial pathology. 2. Midline parietal scalp swelling without underlying calvarial fracture. 3. No acute fracture or traumatic malalignment of the cervical spine. Electronically Signed   By: Lesia Hausen M.D.   On: 03/07/2022 15:17   CT Cervical Spine Wo Contrast  Result Date: 03/07/2022 CLINICAL DATA:  Fall and hit back of head. EXAM: CT HEAD WITHOUT CONTRAST CT CERVICAL SPINE WITHOUT CONTRAST TECHNIQUE: Multidetector CT imaging of the head and cervical spine was performed following the standard protocol without intravenous contrast. Multiplanar CT image reconstructions of the cervical spine were also generated. RADIATION DOSE REDUCTION: This exam was performed according to the departmental dose-optimization program which includes automated exposure control, adjustment of the mA and/or kV according to patient size and/or use of iterative reconstruction technique. COMPARISON:  Cervical spine radiographs 09/18/2005 FINDINGS: CT HEAD FINDINGS Brain: There is no acute intracranial hemorrhage, extra-axial fluid collection, or acute infarct. Parenchymal volume is within expected limits for age. The ventricles are normal in size. Gray-white differentiation is preserved patchy hypodensity throughout the supratentorial white matter likely reflects sequela of underlying chronic small-vessel ischemic change There is no mass lesion there is no mass effect or midline shift. The pituitary and suprasellar region are normal. Vascular: There is calcification of the bilateral carotid siphons. Skull: Normal. Negative for fracture or focal lesion. Sinuses/Orbits: The imaged paranasal sinuses are clear. A right lens implant is noted. The globes and orbits are otherwise unremarkable. Other: There is midline parietal scalp swelling. CT CERVICAL SPINE  FINDINGS Alignment: There is mild anterolisthesis of C3 on C4, C4 on C5, and and C7 on T1, likely degenerative in nature. There is no jumped or perched facet or other evidence of traumatic malalignment. Skull base and vertebrae: Skull base alignment is maintained. Vertebral body heights are preserved. There is no evidence of acute fracture. There is no suspicious osseous lesion. Soft tissues and spinal canal: No prevertebral fluid or swelling. No visible canal hematoma. Disc levels: There is degenerative change of the C1-C2 articulation with prominent cystic change of the dens and surrounding degenerative pannus. There is disc space narrowing and degenerative endplate change  most advanced at C5-C6 and C6-C7. There is no evidence of high-grade spinal canal stenosis. Upper chest: The imaged lung apices are clear. Other: None. IMPRESSION: 1. No acute intracranial pathology. 2. Midline parietal scalp swelling without underlying calvarial fracture. 3. No acute fracture or traumatic malalignment of the cervical spine. Electronically Signed   By: Lesia Hausen M.D.   On: 03/07/2022 15:17    Procedures Procedures   Medications Ordered in ED Medications  acetaminophen (TYLENOL) tablet 1,000 mg (1,000 mg Oral Given 03/07/22 1552)    ED Course/ Medical Decision Making/ A&P   {   Medical Decision Making Amount and/or Complexity of Data Reviewed Labs: ordered. Radiology: ordered.  Risk OTC drugs.  Initial Impression and Ddx 89 year old female who presents to the emergency department with fall Patient PMH that increases complexity of ED encounter: Atrial fibrillation on anticoagulation, hypertension, complete heart block Differential: intracranial hemorrhage, calvarial fracture, c-spine injury, prodromal syndrome like syncope perior to fall, etc.   Interpretation of Diagnostics I independent reviewed and interpreted the labs as followed: bmp, cbc wnl   - I independently visualized the following imaging  with scope of interpretation limited to determining acute life threatening conditions related to emergency care: CT head and c-spine, which revealed no acute findings  Patient Reassessment and Ultimate Disposition/Management 89 year old female who presents to the emergency department after non-witnessed mechanical fall. She is overall well appearing. She appears to be at baseline mental status for her. She has a contusion to the right parietal scalp.  No c-spine tenderness.  Given her age, anticoagulation, obtained basic labs, EKG and CT head and c-spine.  Given tylenol and ice for pain to the scalp.  - EKG with paced rhythm. - labs are wnl  - CT head and c-spine are negative.  No other acute injuries noted. Do not feel she needs further work up at this time. Can continue tylenol at facility and ice PRN. Return precautions for worsening mental status.  I discussed this case with Dr. Jacqulyn Bath, ED attending and he agrees with the plan.  The patient has been appropriately medically screened and/or stabilized in the ED. I have low suspicion for any other emergent medical condition which would require further screening, evaluation or treatment in the ED or require inpatient management. At time of discharge the patient is hemodynamically stable and in no acute distress. I have discussed work-up results and diagnosis with patient and answered all questions. Patient is agreeable with discharge plan. We discussed strict return precautions for returning to the emergency department and they verbalized understanding.     Patient management required discussion with the following services or consulting groups:  None  Complexity of Problems Addressed Acute complicated illness or Injury  Additional Data Reviewed and Analyzed Further history obtained from: EMS on arrival, Past medical history and medications listed in the EMR, Care Everywhere, and Records from care facility  Patient Encounter Risk  Assessment Consideration of hospitalization  Final Clinical Impression(s) / ED Diagnoses Final diagnoses:  Fall, initial encounter    Rx / DC Orders ED Discharge Orders          Ordered    acetaminophen (TYLENOL) 325 MG tablet  Every 6 hours PRN        03/07/22 1610              Cristopher Peru, PA-C 03/07/22 1613    Long, Arlyss Repress, MD 03/09/22 0000

## 2022-03-14 ENCOUNTER — Ambulatory Visit: Payer: Medicare Other

## 2022-03-14 DIAGNOSIS — I442 Atrioventricular block, complete: Secondary | ICD-10-CM | POA: Diagnosis not present

## 2022-03-14 LAB — CUP PACEART REMOTE DEVICE CHECK
Battery Remaining Longevity: 47 mo
Battery Remaining Percentage: 45 %
Battery Voltage: 2.99 V
Brady Statistic AP VP Percent: 99 %
Brady Statistic AP VS Percent: 1 %
Brady Statistic AS VP Percent: 1 %
Brady Statistic AS VS Percent: 1 %
Brady Statistic RA Percent Paced: 99 %
Brady Statistic RV Percent Paced: 99 %
Date Time Interrogation Session: 20240306020014
Implantable Lead Connection Status: 753985
Implantable Lead Connection Status: 753985
Implantable Lead Implant Date: 20190524
Implantable Lead Implant Date: 20190524
Implantable Lead Location: 753859
Implantable Lead Location: 753860
Implantable Pulse Generator Implant Date: 20190524
Lead Channel Impedance Value: 430 Ohm
Lead Channel Impedance Value: 460 Ohm
Lead Channel Pacing Threshold Amplitude: 0.5 V
Lead Channel Pacing Threshold Amplitude: 0.625 V
Lead Channel Pacing Threshold Pulse Width: 0.5 ms
Lead Channel Pacing Threshold Pulse Width: 0.5 ms
Lead Channel Sensing Intrinsic Amplitude: 0.8 mV
Lead Channel Sensing Intrinsic Amplitude: 12 mV
Lead Channel Setting Pacing Amplitude: 0.875
Lead Channel Setting Pacing Amplitude: 2 V
Lead Channel Setting Pacing Pulse Width: 0.5 ms
Lead Channel Setting Sensing Sensitivity: 2 mV
Pulse Gen Model: 2272
Pulse Gen Serial Number: 9022503

## 2022-03-20 ENCOUNTER — Encounter (HOSPITAL_COMMUNITY)
Admission: RE | Admit: 2022-03-20 | Discharge: 2022-03-20 | Disposition: A | Discharge status: Home / Self Care | Payer: Medicare Other | Source: Ambulatory Visit | Attending: Ophthalmology | Admitting: Ophthalmology

## 2022-03-20 NOTE — Pre-Procedure Instructions (Signed)
Attempted pre-op phone call. I spoke with Dorann Lodge, daughter, who states patient had a bad fall a week ago that sent her to the ED. Butch Penny states, "the head CT was okay but she is black and blue from the head up and still has very swollen and black eyes. I think we are going to postpone this for a while until she is feeling better". I messaged Mackie Pai at [facility] to let her know.

## 2022-03-23 ENCOUNTER — Ambulatory Visit (HOSPITAL_COMMUNITY): Admission: RE | Admit: 2022-03-23 | Payer: Medicare Other | Source: Home / Self Care | Admitting: Ophthalmology

## 2022-03-23 ENCOUNTER — Encounter (HOSPITAL_COMMUNITY): Admission: RE | Payer: Self-pay | Source: Home / Self Care

## 2022-03-23 SURGERY — PHACOEMULSIFICATION, CATARACT, WITH IOL INSERTION
Anesthesia: Monitor Anesthesia Care | Laterality: Left

## 2022-04-03 ENCOUNTER — Encounter: Payer: Self-pay | Admitting: Internal Medicine

## 2022-04-03 ENCOUNTER — Ambulatory Visit: Payer: Medicare Other | Attending: Internal Medicine | Admitting: Internal Medicine

## 2022-04-03 VITALS — BP 118/68 | HR 74 | Ht 63.5 in | Wt 142.0 lb

## 2022-04-03 DIAGNOSIS — I442 Atrioventricular block, complete: Secondary | ICD-10-CM | POA: Diagnosis not present

## 2022-04-03 NOTE — Progress Notes (Signed)
HPI Renee Cameron returns today for ongoing evaluation and management of her CHB, sinus node dysfunction, s/p PPM insertion. She is a 89 yo woman with some difficulty hearing and dementia and is on namenda. SHe denies chest pain or sob.  No Known Allergies   Current Outpatient Medications  Medication Sig Dispense Refill   acetaminophen (TYLENOL) 325 MG tablet Take 2 tablets (650 mg total) by mouth every 6 (six) hours as needed. 30 tablet 0   ALPRAZolam (XANAX) 0.25 MG tablet Take 0.25 mg by mouth 2 (two) times daily as needed.     atorvastatin (LIPITOR) 40 MG tablet Take 40 mg by mouth daily.     Cholecalciferol 50 MCG (2000 UT) CAPS Take 1 capsule by mouth daily.     cyanocobalamin (VITAMIN B12) 500 MCG tablet Take 500 mcg by mouth every other day. Take 1 tablet by mouth every Monday Wednesday and Friday     desvenlafaxine (PRISTIQ) 50 MG 24 hr tablet Take 1 tablet by mouth daily.     donepezil (ARICEPT) 5 MG tablet Take 5 mg by mouth at bedtime.  2   furosemide (LASIX) 20 MG tablet Take 20 mg by mouth daily.     losartan (COZAAR) 25 MG tablet Take 25 mg by mouth daily.     MEGARED OMEGA-3 KRILL OIL 500 MG CAPS Take 500 mg by mouth daily at 12 noon.     memantine (NAMENDA) 10 MG tablet Take 1 tablet by mouth daily.     metoprolol succinate (TOPROL-XL) 12.5 mg TB24 24 hr tablet Take 12.5 mg by mouth daily.     Multiple Vitamins-Minerals (PRESERVISION AREDS 2+MULTI VIT) CAPS Take 2 capsules by mouth daily.     Polyvinyl Alcohol-Povidone (REFRESH OP) Apply 1 drop to eye in the morning, at noon, and at bedtime. 1 drop in both eyes 3 times a day     potassium chloride (K-DUR,KLOR-CON) 10 MEQ tablet Take 1 tablet by mouth daily.     XARELTO 15 MG TABS tablet TAKE 1 TABLET(15 MG) BY MOUTH DAILY WITH DINNER 30 tablet 6   No current facility-administered medications for this visit.     Past Medical History:  Diagnosis Date   Anxiety    Atrial fibrillation ([place])    Chronic  anticoagulation    Complete heart block ([place])    a. s/p STJ PPM 05/2017.   Dementia [facility])    Depression    Essential hypertension    Hyperthyroidism 2000   2000   Osteoporosis    Presence of permanent cardiac pacemaker     ROS:   All systems reviewed and negative except as noted in the HPI.   Past Surgical History:  Procedure Laterality Date   ABDOMINAL HYSTERECTOMY     APPENDECTOMY     CATARACT EXTRACTION W/PHACO Right 01/12/2022   Procedure: CATARACT EXTRACTION PHACO AND INTRAOCULAR LENS PLACEMENT (IOC);  Surgeon: Baruch Goldmann, MD;  Location: [facility];  Service: Ophthalmology;  Laterality: Right;  CDE: 15.73   COLONOSCOPY  2005   Dr. Laural Golden: few small diverticula. external hemorrhoids   PACEMAKER IMPLANT N/A 05/31/2017   Procedure: PACEMAKER IMPLANT;  Surgeon: Evans Lance, MD;  Location: [facility];  Service: Cardiovascular;  Laterality: N/A;   TOTAL ABDOMINAL HYSTERECTOMY W/ BILATERAL SALPINGOOPHORECTOMY       Family History  Problem Relation Age of Onset   Prostate cancer Father    Stroke Father    Heart disease Mother  Coronary artery disease Sister        with prior CABG surgery     Social History   Socioeconomic History   Marital status: Widowed    Spouse name: Not on file   Number of children: Not on file   Years of education: Not on file   Highest education level: Not on file  Occupational History   Occupation: Retired    Comment: [employer]  Tobacco Use   Smoking status: Never   Smokeless tobacco: Never  Vaping Use   Vaping Use: Never used  Substance and Sexual Activity   Alcohol use: No    Alcohol/week: 0.0 standard drinks of alcohol   Drug use: No   Sexual activity: Not on file  Other Topics Concern   Not on file  Social History Narrative   Volunteers 2 days weekly   Social Determinants of Health   Financial Resource Strain: Not on file  Food Insecurity: Not on file  Transportation Needs: Not on file  Physical  Activity: Not on file  Stress: Not on file  Social Connections: Not on file  Intimate Partner Violence: Not on file     BP 118/68   Pulse 74   Ht 5' 3.5" (1.613 m)   Wt 142 lb (64.4 kg)   SpO2 95%   BMI 24.76 kg/m   Physical Exam:  Well appearing NAD HEENT: Unremarkable Neck:  No JVD, no thyromegally Lymphatics:  No adenopathy Back:  No CVA tenderness Lungs:  Clear with no wheezes HEART:  Regular rate rhythm, no murmurs, no rubs, no clicks Abd:  soft, positive bowel sounds, no organomegally, no rebound, no guarding Ext:  2 plus pulses, no edema, no cyanosis, no clubbing Skin:  No rashes no nodules Neuro:  CN II through XII intact, motor grossly intact   DEVICE  Normal device function.  See PaceArt for details.   Assess/Plan: 1. CHB - she is asymptomatic, s/p PPM insertion. 2. PPM - her St. Jude DDD PM is working normally.  3. Sinus node dysfunction - she is asymptomatic. She has a junctional escape at 38/min. 4. HTN - her bp is well controlled. No change in her meds.   Renee Overlie Elchanan Bob,MD

## 2022-04-03 NOTE — Patient Instructions (Signed)
Medication Instructions:  Your physician recommends that you continue on your current medications as directed. Please refer to the Current Medication list given to you today.  *If you need a refill on your cardiac medications before your next appointment, please call your pharmacy*   Lab Work: NONE   If you have labs (blood work) drawn today and your tests are completely normal, you will receive your results only by: MyChart Message (if you have MyChart) OR A paper copy in the mail If you have any lab test that is abnormal or we need to change your treatment, we will call you to review the results.   Testing/Procedures: NONE    Follow-Up: At [facility], you and your health needs are our priority.  As part of our continuing mission to provide you with exceptional heart care, we have created designated Provider Care Teams.  These Care Teams include your primary Cardiologist (physician) and Advanced Practice Providers (APPs -  Physician Assistants and Nurse Practitioners) who all work together to provide you with the care you need, when you need it.  We recommend signing up for the patient portal called "MyChart".  Sign up information is provided on this After Visit Summary.  MyChart is used to connect with patients for Virtual Visits (Telemedicine).  Patients are able to view lab/test results, encounter notes, upcoming appointments, etc.  Non-urgent messages can be sent to your provider as well.   To learn more about what you can do with MyChart, go to [email or web address].    Your next appointment:   1 year(s)  Provider:   Gregg Taylor, MD    Other Instructions Thank you for choosing  HeartCare!    

## 2022-04-18 NOTE — Progress Notes (Signed)
Remote pacemaker transmission.   

## 2022-05-10 LAB — CBC
Hematocrit: 40.7 % (ref 34.0–46.6)
Hemoglobin: 12.9 g/dL (ref 11.1–15.9)
MCH: 29.9 pg (ref 26.6–33.0)
MCHC: 31.7 g/dL (ref 31.5–35.7)
MCV: 94 fL (ref 79–97)
Platelets: 187 10*3/uL (ref 150–450)
RBC: 4.32 x10E6/uL (ref 3.77–5.28)
RDW: 12.5 % (ref 11.7–15.4)
WBC: 5.8 10*3/uL (ref 3.4–10.8)

## 2022-05-10 LAB — BASIC METABOLIC PANEL
BUN/Creatinine Ratio: 11 — ABNORMAL LOW (ref 12–28)
BUN: 10 mg/dL (ref 10–36)
CO2: 26 mmol/L (ref 20–29)
Calcium: 9.4 mg/dL (ref 8.7–10.3)
Chloride: 100 mmol/L (ref 96–106)
Creatinine, Ser: 0.93 mg/dL (ref 0.57–1.00)
Glucose: 165 mg/dL — ABNORMAL HIGH (ref 70–99)
Potassium: 4.6 mmol/L (ref 3.5–5.2)
Sodium: 140 mmol/L (ref 134–144)
eGFR: 56 mL/min/{1.73_m2} — ABNORMAL LOW (ref >59)

## 2022-05-17 ENCOUNTER — Encounter (HOSPITAL_COMMUNITY)
Admission: RE | Admit: 2022-05-17 | Discharge: 2022-05-17 | Disposition: A | Discharge status: Home / Self Care | Payer: Medicare Other | Source: Ambulatory Visit | Attending: Ophthalmology | Admitting: Ophthalmology

## 2022-05-17 NOTE — Pre-Procedure Instructions (Signed)
Attempted pre-op phone call. Left VM for her daughter, [name] to call us back.

## 2022-05-21 ENCOUNTER — Ambulatory Visit (HOSPITAL_COMMUNITY): Admission: RE | Admit: 2022-05-21 | Payer: Medicare Other | Source: Home / Self Care | Admitting: Ophthalmology

## 2022-05-21 ENCOUNTER — Encounter (HOSPITAL_COMMUNITY): Admission: RE | Payer: Self-pay | Source: Home / Self Care

## 2022-05-21 SURGERY — PHACOEMULSIFICATION, CATARACT, WITH IOL INSERTION
Anesthesia: Monitor Anesthesia Care | Laterality: Left

## 2022-06-13 ENCOUNTER — Ambulatory Visit (INDEPENDENT_AMBULATORY_CARE_PROVIDER_SITE_OTHER): Payer: Medicare Other

## 2022-06-13 ENCOUNTER — Encounter: Payer: Self-pay | Admitting: Cardiology

## 2022-06-13 ENCOUNTER — Ambulatory Visit: Payer: Medicare Other | Attending: Cardiology | Admitting: Cardiology

## 2022-06-13 VITALS — BP 144/62 | HR 70 | Ht 65.5 in | Wt 141.0 lb

## 2022-06-13 DIAGNOSIS — I10 Essential (primary) hypertension: Secondary | ICD-10-CM | POA: Diagnosis present

## 2022-06-13 DIAGNOSIS — I442 Atrioventricular block, complete: Secondary | ICD-10-CM

## 2022-06-13 DIAGNOSIS — I4821 Permanent atrial fibrillation: Secondary | ICD-10-CM | POA: Insufficient documentation

## 2022-06-13 LAB — CUP PACEART REMOTE DEVICE CHECK
Battery Remaining Longevity: 44 mo
Battery Remaining Percentage: 42 %
Battery Voltage: 2.98 V
Brady Statistic AP VP Percent: 99 %
Brady Statistic AP VS Percent: 1 %
Brady Statistic AS VP Percent: 1 %
Brady Statistic AS VS Percent: 1 %
Brady Statistic RA Percent Paced: 99 %
Brady Statistic RV Percent Paced: 99 %
Date Time Interrogation Session: 20240605020014
Implantable Lead Connection Status: 753985
Implantable Lead Connection Status: 753985
Implantable Lead Implant Date: 20190524
Implantable Lead Implant Date: 20190524
Implantable Lead Location: 753859
Implantable Lead Location: 753860
Implantable Pulse Generator Implant Date: 20190524
Lead Channel Impedance Value: 440 Ohm
Lead Channel Impedance Value: 480 Ohm
Lead Channel Pacing Threshold Amplitude: 0.5 V
Lead Channel Pacing Threshold Amplitude: 0.625 V
Lead Channel Pacing Threshold Pulse Width: 0.5 ms
Lead Channel Pacing Threshold Pulse Width: 0.5 ms
Lead Channel Sensing Intrinsic Amplitude: 0.8 mV
Lead Channel Sensing Intrinsic Amplitude: 12 mV
Lead Channel Setting Pacing Amplitude: 0.875
Lead Channel Setting Pacing Amplitude: 2 V
Lead Channel Setting Pacing Pulse Width: 0.5 ms
Lead Channel Setting Sensing Sensitivity: 2 mV
Pulse Gen Model: 2272
Pulse Gen Serial Number: 9022503

## 2022-06-13 NOTE — Progress Notes (Signed)
    Cardiology Office Note  Date: 06/13/2022   ID: YOHANA TASKER, DOB 04-Apr-1924, MRN [id number]  History of Present Illness: Renee Cameron is a 89 y.o. female last seen in November 2023.  She is here today for a routine visit, continues to reside at [place].  No new complaints voiced.  I reviewed her medications, no reported spontaneous bleeding problems on Xarelto.  She had lab work in May which is outlined below.  Does have nurse practitioner that follows at facility from a primary care perspective.  St. Jude pacemaker in place with follow-up with Dr. Ladona Ridgel.  She had a visit with him in March, I reviewed the note.  Most recent device interrogation indicated normal function.  She is using a walker today, no recent falls.  Physical Exam: VS:  BP (!) 144/62   Pulse 70   Ht 5' 5.5" (1.664 m)   Wt 141 lb (64 kg)   SpO2 94%   BMI 23.11 kg/m , BMI Body mass index is 23.11 kg/m.  Wt Readings from Last 3 Encounters:  06/13/22 141 lb (64 kg)  04/03/22 142 lb (64.4 kg)  03/07/22 139 lb 15.9 oz (63.5 kg)    General: Patient appears comfortable at rest. HEENT: Conjunctiva and lids normal. Neck: Supple, no elevated JVP or carotid bruits. Lungs: Clear to auscultation, nonlabored breathing at rest. Cardiac: Regular rate and rhythm, no S3, 1/6 systolic murmur.  ECG:  An ECG dated 03/07/2022 was personally reviewed today and demonstrated:  Dual-chamber pacing.  Labwork: 05/09/2022: BUN 10; Creatinine, Ser 0.93; Hemoglobin 12.9; Platelets 187; Potassium 4.6; Sodium 140   Other Studies Reviewed Today:  No interval cardiac testing for review today.  Assessment and Plan:  1.  Permanent atrial fibrillation with CHA2DS2-VASc score of 4.  She is asymptomatic at this time with plan to continue anticoagulation with Xarelto, also on Toprol-XL.  I reviewed her recent lab work.  No reported spontaneous bleeding problems.  2.  Complete heart block status post St. Jude pacemaker with follow-up  by Dr. Ladona Ridgel.  Last device interrogation was normal in March.  3.  Essential hypertension.  No changes made to current regimen which includes Cozaar.  Disposition:  Follow up  6 months.  Signed, Jonelle Sidle, M.D., F.A.C.C. [facility] at [facility]

## 2022-06-13 NOTE — Patient Instructions (Signed)
Medication Instructions:  Your physician recommends that you continue on your current medications as directed. Please refer to the Current Medication list given to you today.   Labwork: None today  Testing/Procedures: None today  Follow-Up: 6 months  Any Other Special Instructions Will Be Listed Below (If Applicable).  If you need a refill on your cardiac medications before your next appointment, please call your pharmacy.  

## 2022-07-09 NOTE — Progress Notes (Signed)
Remote pacemaker transmission.   

## 2022-07-24 ENCOUNTER — Encounter (HOSPITAL_COMMUNITY): Payer: Self-pay

## 2022-07-24 ENCOUNTER — Encounter (HOSPITAL_COMMUNITY)
Admission: RE | Admit: 2022-07-24 | Discharge: 2022-07-24 | Disposition: A | Discharge status: Home / Self Care | Payer: Medicare Other | Source: Ambulatory Visit | Attending: Ophthalmology | Admitting: Ophthalmology

## 2022-07-24 ENCOUNTER — Other Ambulatory Visit: Payer: Self-pay

## 2022-07-25 NOTE — H&P (Signed)
Surgical History & Physical  Patient Name: Renee Cameron  DOB: 1924-05-21  Surgery: Cataract extraction with intraocular lens implant phacoemulsification; Left Eye Surgeon: [name] MD Surgery Date: 07/27/2022 Pre-Op Date: 05/07/2022  HPI: A 26 Yr. old female patient present for preop paperwork OS. Patient was seen Friday, 05/04/2022 by Dr. Lelan Pons at [facility]. OD AMD has turned wet and she had her first injection on Friday. Patient also received injection OS as well. Dr. Lelan Pons told her daughter patient has beginnings of glaucoma. He did not start treatment and wanted her to discuss this with Dr. June Leap for treatment options. Patient also returns for follow up of blurry vision from cataract in the left eye. The vision has continued to be blurry and she has trouble doing most activities using the left eye. This is negatively affecting the patient's quality of life and the patient is unable to function adequately in life with the current level of vision. HPI was performed by [name] .  Medical History: Macula Degeneration Cataracts  Heart Problem High Blood Pressure LDL  Review of Systems Negative Allergic/Immunologic Negative Cardiovascular Negative Constitutional Negative Ear, Nose, Mouth & Throat Negative Endocrine Negative Eyes Negative Gastrointestinal Negative Genitourinary Negative Hemotologic/Lymphatic Negative Integumentary Negative Musculoskeletal Negative Neurological Negative Psychiatry Negative Respiratory  Social Never smoked   Medication Ocuvite Eye Plus, Systane Complete,  Zofran, Coumadin, Atorvastatin, Klor-Con, Fish Oil, Omega-3, Ondansetron, Ranitidine, Aricept, Cholecalciferol, Lasix, Losartan, Memantine, Metoprolol, Potassium chloride, Pristiq, Vitamin D2, Xarelto, Xanax, Tylenol, Aricept   Sx/Procedures Phaco c IOL OD,  Tubal Ligation, Heart surgery- pacemaker  Drug Allergies  NKDA  History & Physical: Heent: PCL OD, cataract  OS NECK: supple without bruits LUNGS: lungs clear to auscultation CV: regular rate and rhythm Abdomen: soft and non-tender  Impression & Plan: Assessment: 1.  CATARACT EXTRACTION STATUS; Right Eye (Z98.41) 2.  COMBINED FORMS AGE RELATED CATARACT; Left Eye (H25.812) 3.  INTRAOCULAR LENS IOL ; Right Eye (Z96.1) 4.  PCO; Right Eye (H26.491) 5.  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  2 months after cataract surgery. Doing well with improved vision and normal eye pressure. Call with any problems or concerns.  2.  Cataract accounts for the patient's decreased vision. This visual impairment is not correctable with a tolerable change in glasses or contact lenses. Cataract surgery with an implantation of a new lens should significantly improve the visual and functional status of the patient. Discussed all risks, benefits, alternatives, and potential complications. Discussed the procedures and recovery. Patient desires to have surgery. A-scan ordered and performed today for intra-ocular lens calculations. The surgery will be performed in order to improve vision for driving, reading, and for eye examinations. Recommend phacoemulsification with intra-ocular lens. Recommend Dextenza for post-operative pain and inflammation. Left Eye. Surgery required to correct imbalance of vision. Dilates poorly - shugarcaine by protocol. Malyugin Ring. Omidira. Pseudoexfolation. Schedule surgery 1-2 weeks after intra-vitreal injection OS.  3.  As above.  4.  Asymptomatic. Findings, prognosis and treatment options reviewed. No indication for laser at this point, will observe for changes.  5.  Large CD ratio OU. OCT rNFL was WNL OD, slight thinning OS.. 05/04/21 IOPs WNL OU today. Given excellent IOPs, no need for additional treatment as this time. Will address after retinal at cataract treatment.

## 2022-08-16 ENCOUNTER — Encounter (HOSPITAL_COMMUNITY)
Admission: RE | Admit: 2022-08-16 | Discharge: 2022-08-16 | Disposition: A | Discharge status: Home / Self Care | Payer: Medicare Other | Source: Ambulatory Visit | Attending: Ophthalmology | Admitting: Ophthalmology

## 2022-08-16 NOTE — H&P (Signed)
Surgical History & Physical  Patient Name: Renee Cameron  DOB: [birth year]  Surgery: Cataract extraction with intraocular lens implant phacoemulsification; Left Eye Surgeon: [name] MD Surgery Date: 08/20/2022 Pre-Op Date: 08/16/2022  HPI: A 69 Yr. old female patient present for preop paperwork OS. Patient was seen Friday, 05/04/2022 by Dr. Lelan Pons at [name]. OD AMD has turned wet and she had her first injection on Friday. Patient also received injection OS as well. Dr. Lelan Pons told her daughter patient has beginnings of glaucoma. He did not start treatment and wanted her to discuss this with Dr. June Leap for treatment options. Patient also returns for follow up of blurry vision from cataract in the left eye. The vision has continued to be blurry and she has trouble doing most activities using the left eye. This is negatively affecting the patient's quality of life and the patient is unable to function adequately in life with the current level of vision. HPI was performed by [name] .  Medical History: Macula Degeneration Cataracts  Heart Problem High Blood Pressure LDL  Review of Systems Negative Allergic/Immunologic Negative Cardiovascular Negative Constitutional Negative Ear, Nose, Mouth & Throat Negative Endocrine Negative Eyes Negative Gastrointestinal Negative Genitourinary Negative Hemotologic/Lymphatic Negative Integumentary Negative Musculoskeletal Negative Neurological Negative Psychiatry Negative Respiratory  Social Never smoked   Medication Ocuvite Eye Plus, Systane Complete,  Zofran, Coumadin, Atorvastatin, Klor-Con, Fish Oil, Omega-3, Ondansetron, Ranitidine, Aricept, Cholecalciferol, Lasix, Losartan, Memantine, Metoprolol, Potassium chloride, Pristiq, Vitamin D2, Xarelto, Xanax, Tylenol, Aricept  Sx/Procedures Phaco c IOL OD,  Tubal Ligation, Heart surgery- pacemaker  Drug Allergies  NKDA  History & Physical: Heent: cataracts NECK:  supple without bruits LUNGS: lungs clear to auscultation CV: regular rate and rhythm Abdomen: soft and non-tender  Impression & Plan: Assessment: 1.  CATARACT EXTRACTION STATUS; Right Eye (Z98.41) 2.  COMBINED FORMS AGE RELATED CATARACT; Left Eye (H25.812) 3.  INTRAOCULAR LENS IOL ; Right Eye (Z96.1) 4.  PCO; Right Eye (H26.491) 5.  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  2 months after cataract surgery. Doing well with improved vision and normal eye pressure. Call with any problems or concerns.  2.  Cataract accounts for the patient's decreased vision. This visual impairment is not correctable with a tolerable change in glasses or contact lenses. Cataract surgery with an implantation of a new lens should significantly improve the visual and functional status of the patient. Discussed all risks, benefits, alternatives, and potential complications. Discussed the procedures and recovery. Patient desires to have surgery. A-scan ordered and performed today for intra-ocular lens calculations. The surgery will be performed in order to improve vision for driving, reading, and for eye examinations. Recommend phacoemulsification with intra-ocular lens. Recommend Dextenza for post-operative pain and inflammation. Left Eye. Surgery required to correct imbalance of vision. Dilates poorly - shugarcaine by protocol. Malyugin Ring. Omidira. Pseudoexfolation. Schedule surgery 1-2 weeks after intra-vitreal injection OS.  3.  As above.  4.  Asymptomatic. Findings, prognosis and treatment options reviewed. No indication for laser at this point, will observe for changes.  5.  Large CD ratio OU. OCT rNFL was WNL OD, slight thinning OS.. 05/04/21 IOPs WNL OU today. Given excellent IOPs, no need for additional treatment as this time. Will address after retinal at cataract treatment.

## 2022-08-20 ENCOUNTER — Ambulatory Visit (HOSPITAL_COMMUNITY)
Admission: RE | Admit: 2022-08-20 | Discharge: 2022-08-20 | Disposition: A | Discharge status: Home / Self Care | Payer: Medicare Other | Attending: Ophthalmology | Admitting: Ophthalmology

## 2022-08-20 ENCOUNTER — Ambulatory Visit (HOSPITAL_COMMUNITY): Payer: Medicare Other | Admitting: Anesthesiology

## 2022-08-20 ENCOUNTER — Encounter (HOSPITAL_COMMUNITY): Payer: Self-pay | Admitting: Ophthalmology

## 2022-08-20 ENCOUNTER — Encounter (HOSPITAL_COMMUNITY)
Admission: RE | Disposition: A | Discharge status: Home / Self Care | Payer: Self-pay | Source: Home / Self Care | Attending: Ophthalmology

## 2022-08-20 ENCOUNTER — Other Ambulatory Visit: Payer: Self-pay

## 2022-08-20 DIAGNOSIS — H401434 Capsular glaucoma with pseudoexfoliation of lens, bilateral, indeterminate stage: Secondary | ICD-10-CM | POA: Diagnosis not present

## 2022-08-20 DIAGNOSIS — H2181 Floppy iris syndrome: Secondary | ICD-10-CM | POA: Insufficient documentation

## 2022-08-20 DIAGNOSIS — F0390 Unspecified dementia without behavioral disturbance: Secondary | ICD-10-CM | POA: Insufficient documentation

## 2022-08-20 DIAGNOSIS — H25812 Combined forms of age-related cataract, left eye: Secondary | ICD-10-CM | POA: Diagnosis not present

## 2022-08-20 DIAGNOSIS — H2512 Age-related nuclear cataract, left eye: Secondary | ICD-10-CM | POA: Diagnosis not present

## 2022-08-20 DIAGNOSIS — I10 Essential (primary) hypertension: Secondary | ICD-10-CM | POA: Diagnosis not present

## 2022-08-20 DIAGNOSIS — F419 Anxiety disorder, unspecified: Secondary | ICD-10-CM | POA: Diagnosis not present

## 2022-08-20 DIAGNOSIS — I4891 Unspecified atrial fibrillation: Secondary | ICD-10-CM | POA: Insufficient documentation

## 2022-08-20 DIAGNOSIS — I4821 Permanent atrial fibrillation: Secondary | ICD-10-CM | POA: Diagnosis not present

## 2022-08-20 DIAGNOSIS — F32A Depression, unspecified: Secondary | ICD-10-CM | POA: Insufficient documentation

## 2022-08-20 DIAGNOSIS — Z9841 Cataract extraction status, right eye: Secondary | ICD-10-CM | POA: Insufficient documentation

## 2022-08-20 HISTORY — PX: CATARACT EXTRACTION W/PHACO: SHX586

## 2022-08-20 SURGERY — PHACOEMULSIFICATION, CATARACT, WITH IOL INSERTION
Anesthesia: Monitor Anesthesia Care | Site: Eye | Laterality: Left

## 2022-08-20 MED ORDER — PHENYLEPHRINE-KETOROLAC 1-0.3 % IO SOLN
INTRAOCULAR | Status: AC
  Filled 2022-08-20: qty 4

## 2022-08-20 MED ORDER — PHENYLEPHRINE-KETOROLAC 1-0.3 % IO SOLN
INTRAOCULAR | Status: DC | PRN
  Administered 2022-08-20: 500 mL via OPHTHALMIC

## 2022-08-20 MED ORDER — LIDOCAINE HCL (PF) 1 % IJ SOLN
INTRAOCULAR | Status: DC | PRN
  Administered 2022-08-20: 1 mL via OPHTHALMIC

## 2022-08-20 MED ORDER — EPINEPHRINE PF 1 MG/ML IJ SOLN
INTRAMUSCULAR | Status: AC
  Filled 2022-08-20: qty 1

## 2022-08-20 MED ORDER — NEOMYCIN-POLYMYXIN-DEXAMETH 3.5-10000-0.1 OP SUSP
OPHTHALMIC | Status: DC | PRN
  Administered 2022-08-20: 2 [drp] via OPHTHALMIC

## 2022-08-20 MED ORDER — STERILE WATER FOR IRRIGATION IR SOLN
Status: DC | PRN
  Administered 2022-08-20: 1

## 2022-08-20 MED ORDER — LIDOCAINE HCL 3.5 % OP GEL
1.0000 | Freq: Once | OPHTHALMIC | Status: AC
  Administered 2022-08-20: 1 via OPHTHALMIC

## 2022-08-20 MED ORDER — SODIUM HYALURONATE 23MG/ML IO SOSY
PREFILLED_SYRINGE | INTRAOCULAR | Status: DC | PRN
  Administered 2022-08-20: .6 mL via INTRAOCULAR

## 2022-08-20 MED ORDER — SODIUM HYALURONATE 10 MG/ML IO SOLUTION
PREFILLED_SYRINGE | INTRAOCULAR | Status: DC | PRN
  Administered 2022-08-20 (×2): .85 mL via INTRAOCULAR

## 2022-08-20 MED ORDER — POVIDONE-IODINE 5 % OP SOLN
OPHTHALMIC | Status: DC | PRN
  Administered 2022-08-20: 1 via OPHTHALMIC

## 2022-08-20 MED ORDER — TROPICAMIDE 1 % OP SOLN
1.0000 [drp] | OPHTHALMIC | Status: AC | PRN
  Administered 2022-08-20 (×3): 1 [drp] via OPHTHALMIC

## 2022-08-20 MED ORDER — BSS IO SOLN
INTRAOCULAR | Status: DC | PRN
  Administered 2022-08-20: 15 mL via INTRAOCULAR

## 2022-08-20 MED ORDER — TETRACAINE HCL 0.5 % OP SOLN
1.0000 [drp] | OPHTHALMIC | Status: AC | PRN
  Administered 2022-08-20 (×3): 1 [drp] via OPHTHALMIC

## 2022-08-20 MED ORDER — PHENYLEPHRINE HCL 2.5 % OP SOLN
1.0000 [drp] | OPHTHALMIC | Status: AC | PRN
  Administered 2022-08-20 (×3): 1 [drp] via OPHTHALMIC

## 2022-08-20 SURGICAL SUPPLY — 17 items
CATARACT SUITE SIGHTPATH (MISCELLANEOUS) ×1 IMPLANT
CLOTH BEACON ORANGE TIMEOUT ST (SAFETY) ×1 IMPLANT
EYE SHIELD UNIVERSAL CLEAR (GAUZE/BANDAGES/DRESSINGS) IMPLANT
FEE CATARACT SUITE SIGHTPATH (MISCELLANEOUS) ×1 IMPLANT
GLOVE BIOGEL PI IND STRL 7.0 (GLOVE) ×2 IMPLANT
LENS IOL RAYNER 22.5 (Intraocular Lens) ×1 IMPLANT
LENS IOL RAYONE EMV 22.5 (Intraocular Lens) IMPLANT
NDL HYPO 18GX1.5 BLUNT FILL (NEEDLE) ×1 IMPLANT
NEEDLE HYPO 18GX1.5 BLUNT FILL (NEEDLE) ×1 IMPLANT
PAD ARMBOARD 7.5X6 YLW CONV (MISCELLANEOUS) ×1 IMPLANT
POSITIONER HEAD 8X9X4 ADT (SOFTGOODS) ×1 IMPLANT
RING MALYGIN 7.0 (MISCELLANEOUS) IMPLANT
SYR TB 1ML LL NO SAFETY (SYRINGE) ×1 IMPLANT
TAPE SURG TRANSPORE 1 IN (GAUZE/BANDAGES/DRESSINGS) IMPLANT
TIP IRRIGATON/ASPIRATION (MISCELLANEOUS) IMPLANT
VISCOELASTIC ADDITIONAL (OPHTHALMIC RELATED) IMPLANT
WATER STERILE IRR 250ML POUR (IV SOLUTION) ×1 IMPLANT

## 2022-08-20 NOTE — Discharge Instructions (Addendum)
Please discharge patient when stable, will follow up today with Dr. Wrzosek at the [facility] office immediately following discharge.  Leave shield in place until visit.  All paperwork with discharge instructions will be given at the office.  [facility] Address:  [address]  [place], [place] [address]  

## 2022-08-20 NOTE — Interval H&P Note (Signed)
History and Physical Interval Note:  08/20/2022 1:29 PM  Renee Cameron  has presented today for surgery, with the diagnosis of combined forms age related cataract, left eye.  The various methods of treatment have been discussed with the patient and family. After consideration of risks, benefits and other options for treatment, the patient has consented to  Procedure(s): CATARACT EXTRACTION PHACO AND INTRAOCULAR LENS PLACEMENT (IOC) (Left) as a surgical intervention.  The patient's history has been reviewed, patient examined, no change in status, stable for surgery.  I have reviewed the patient's chart and labs.  Questions were answered to the patient's satisfaction.     [name]

## 2022-08-20 NOTE — Transfer of Care (Signed)
Immediate Anesthesia Transfer of Care Note  Patient: Renee Cameron  Procedure(s) Performed: CATARACT EXTRACTION PHACO AND INTRAOCULAR LENS PLACEMENT (IOC) (Left: Eye)  Patient Location: Short Stay  Anesthesia Type:MAC  Level of Consciousness: awake and alert   Airway & Oxygen Therapy: Patient Spontanous Breathing  Post-op Assessment: Report given to RN and Post -op Vital signs reviewed and stable  Post vital signs: Reviewed and stable  Last Vitals:  Vitals Value Taken Time  BP 139/77 08/20/22 1401  Temp 36.7 C 08/20/22 1401  Pulse 72 08/20/22 1401  Resp 16 08/20/22 1401  SpO2 99 % 08/20/22 1401    Last Pain:  Vitals:   08/20/22 1401  TempSrc: Oral  PainSc: 0-No pain         Complications: No notable events documented.

## 2022-08-20 NOTE — Op Note (Signed)
Date of procedure: 08/20/22  Pre-operative diagnosis: Visually significant age-related combined cataract, Left Eye; Poor dilation, Left eye (H25.812), Pseudoexfoliation Syndrome, Left Eye  Post-operative diagnosis: Visually significant age-related cataract, Left Eye; Intra-operative Floppy Iris Syndrome, Left Eye (H21.81)  Procedure: Complex removal of cataract via phacoemulsification and insertion of intra-ocular lens Rayner [id number] +22.5D into the capsular bag of the Left Eye (CPT [id number])  Attending surgeon: Rudy Jew. Takya Vandivier, MD, MA  Anesthesia: MAC, Topical Akten  Complications: None  Estimated Blood Loss: <48mL (minimal)  Specimens: None  Implants: As above  Indications:  Visually significant cataract, Left Eye  Procedure:  The patient was seen and identified in the pre-operative area. The operative eye was identified and dilated.  The operative eye was marked.  Topical anesthesia was administered to the operative eye.     The patient was then to the operative suite and placed in the supine position.  A timeout was performed confirming the patient, procedure to be performed, and all other relevant information.   The patient's face was prepped and draped in the usual fashion for intra-ocular surgery.  A lid speculum was placed into the operative eye and the surgical microscope moved into place and focused.  Poor dilation of the iris was confirmed.  An inferotemporal paracentesis was created using a 20 gauge paracentesis blade.  Shugarcaine was injected into the anterior chamber.  Viscoelastic was injected into the anterior chamber.  A temporal clear-corneal main wound incision was created using a 2.25mm microkeratome.  A Malyugin ring was placed.  A continuous curvilinear capsulorrhexis was initiated using an irrigating cystitome and completed using capsulorrhexis forceps.  Hydrodissection and hydrodeliniation were performed.  Viscoelastic was injected into the anterior chamber.  A  phacoemulsification handpiece and a chopper as a second instrument were used to remove the nucleus and epinucleus. The irrigation/aspiration handpiece was used to remove any remaining cortical material.   The capsular bag was reinflated with viscoelastic, checked, and found to be intact.  The intraocular lens was inserted into the capsular bag and dialed into place using a Customer service manager. The Malyugin ring was removed.  The irrigation/aspiration handpiece was used to remove any remaining viscoelastic.  The clear corneal wound and paracentesis wounds were then hydrated and checked with Weck-Cels to be watertight. Maxitrol drops were instilled into the operative eye. The lid-speculum and drape was removed, and the patient's face was cleaned with a wet and dry 4x4. A clear shield was taped over the eye. The patient was taken to the post-operative care unit in good condition, having tolerated the procedure well.  Post-Op Instructions: The patient will follow up at [facility] for a same day post-operative evaluation and will receive all other orders and instructions.

## 2022-08-20 NOTE — Anesthesia Postprocedure Evaluation (Signed)
Anesthesia Post Note  Patient: ZULEIKA CABAZOS  Procedure(s) Performed: CATARACT EXTRACTION PHACO AND INTRAOCULAR LENS PLACEMENT (IOC) (Left: Eye)  Patient location during evaluation: Phase II Anesthesia Type: MAC Level of consciousness: awake and alert and oriented Pain management: pain level controlled Vital Signs Assessment: post-procedure vital signs reviewed and stable Respiratory status: spontaneous breathing, nonlabored ventilation and respiratory function stable Cardiovascular status: blood pressure returned to baseline and stable Postop Assessment: no apparent nausea or vomiting Anesthetic complications: no  No notable events documented.   Last Vitals:  Vitals:   08/20/22 1141 08/20/22 1401  BP: (!) 157/78 139/77  Pulse: 72 72  Resp: 18 16  Temp: 36.7 C 36.7 C  SpO2: 100% 99%    Last Pain:  Vitals:   08/20/22 1401  TempSrc: Oral  PainSc: 0-No pain                 Sheralee Qazi C Danis P[place]bleton

## 2022-08-20 NOTE — Anesthesia Preprocedure Evaluation (Signed)
Anesthesia Evaluation  Patient identified by MRN, date of birth, ID band Patient awake    Reviewed: Allergy & Precautions, H&P , NPO status , Patient's Chart, lab work & pertinent test results, reviewed documented beta blocker date and time   Airway Mallampati: II  TM Distance: >3 FB Neck ROM: Full    Dental  (+) Dental Advisory Given, Partial Lower, Caps   Pulmonary pneumonia   Pulmonary exam normal breath sounds clear to auscultation       Cardiovascular hypertension, Pt. on medications and Pt. on home beta blockers + dysrhythmias Atrial Fibrillation + pacemaker  Rhythm:Regular Rate:Normal     Neuro/Psych  PSYCHIATRIC DISORDERS Anxiety Depression   Dementia negative neurological ROS     GI/Hepatic negative GI ROS, Neg liver ROS,,,  Endo/Other   Hyperthyroidism   Renal/GU negative Renal ROS  negative genitourinary   Musculoskeletal negative musculoskeletal ROS (+)    Abdominal   Peds negative pediatric ROS (+)  Hematology  (+) Blood dyscrasia, anemia   Anesthesia Other Findings   Reproductive/Obstetrics negative OB ROS                             Anesthesia Physical Anesthesia Plan  ASA: 3  Anesthesia Plan: MAC   Post-op Pain Management: Minimal or no pain anticipated   Induction:   PONV Risk Score and Plan: Treatment may vary due to age or medical condition  Airway Management Planned: Nasal Cannula and Natural Airway  Additional Equipment:   Intra-op Plan:   Post-operative Plan:   Informed Consent: I have reviewed the patients History and Physical, chart, labs and discussed the procedure including the risks, benefits and alternatives for the proposed anesthesia with the patient or authorized representative who has indicated his/her understanding and acceptance.       Plan Discussed with: CRNA and Surgeon  Anesthesia Plan Comments:         Anesthesia Quick  Evaluation

## 2022-08-22 ENCOUNTER — Encounter (HOSPITAL_COMMUNITY): Payer: Self-pay | Admitting: Ophthalmology

## 2022-08-22 ENCOUNTER — Other Ambulatory Visit (HOSPITAL_COMMUNITY): Payer: No Typology Code available for payment source

## 2022-09-12 ENCOUNTER — Ambulatory Visit (INDEPENDENT_AMBULATORY_CARE_PROVIDER_SITE_OTHER): Payer: Medicare Other

## 2022-09-12 DIAGNOSIS — I442 Atrioventricular block, complete: Secondary | ICD-10-CM | POA: Diagnosis not present

## 2022-09-12 LAB — CUP PACEART REMOTE DEVICE CHECK
Battery Remaining Longevity: 41 mo
Battery Remaining Percentage: 39 %
Battery Voltage: 2.98 V
Brady Statistic AP VP Percent: 99 %
Brady Statistic AP VS Percent: 1 %
Brady Statistic AS VP Percent: 1 %
Brady Statistic AS VS Percent: 1 %
Brady Statistic RA Percent Paced: 99 %
Brady Statistic RV Percent Paced: 99 %
Date Time Interrogation Session: 20240904020018
Implantable Lead Connection Status: 753985
Implantable Lead Connection Status: 753985
Implantable Lead Implant Date: 20190524
Implantable Lead Implant Date: 20190524
Implantable Lead Location: 753859
Implantable Lead Location: 753860
Implantable Pulse Generator Implant Date: 20190524
Lead Channel Impedance Value: 460 Ohm
Lead Channel Impedance Value: 480 Ohm
Lead Channel Pacing Threshold Amplitude: 0.5 V
Lead Channel Pacing Threshold Amplitude: 0.625 V
Lead Channel Pacing Threshold Pulse Width: 0.5 ms
Lead Channel Pacing Threshold Pulse Width: 0.5 ms
Lead Channel Sensing Intrinsic Amplitude: 1.1 mV
Lead Channel Sensing Intrinsic Amplitude: 12 mV
Lead Channel Setting Pacing Amplitude: 0.875
Lead Channel Setting Pacing Amplitude: 2 V
Lead Channel Setting Pacing Pulse Width: 0.5 ms
Lead Channel Setting Sensing Sensitivity: 2 mV
Pulse Gen Model: 2272
Pulse Gen Serial Number: 9022503

## 2022-09-17 ENCOUNTER — Other Ambulatory Visit (HOSPITAL_COMMUNITY)
Admission: RE | Admit: 2022-09-17 | Discharge: 2022-09-17 | Disposition: A | Discharge status: Home / Self Care | Payer: Medicare Other | Source: Ambulatory Visit | Attending: Obstetrics & Gynecology | Admitting: Obstetrics & Gynecology

## 2022-09-17 ENCOUNTER — Encounter: Payer: Self-pay | Admitting: Obstetrics & Gynecology

## 2022-09-17 ENCOUNTER — Ambulatory Visit: Payer: Medicare Other | Admitting: Obstetrics & Gynecology

## 2022-09-17 VITALS — BP 136/77 | HR 70 | Wt 142.0 lb

## 2022-09-17 DIAGNOSIS — N950 Postmenopausal bleeding: Secondary | ICD-10-CM

## 2022-09-17 MED ORDER — MEDROXYPROGESTERONE ACETATE 10 MG PO TABS: 10.0000 mg | ORAL_TABLET | Freq: Every day | ORAL | 11 refills | Status: DC

## 2022-09-17 MED ORDER — MEDROXYPROGESTERONE ACETATE 10 MG PO TABS: 10.0000 mg | ORAL_TABLET | Freq: Every day | ORAL | 0 refills | Status: DC

## 2022-09-17 NOTE — Progress Notes (Signed)
GYN VISIT Patient name: Renee Cameron MRN [id number]  Date of birth: November 17, 1924 Chief Complaint:   Vaginal Bleeding  History of Present Illness:   Renee Cameron is a 89 y.o.PM female being seen today for the following concerns:  Pt is hard of hearing and present with her daughter who provides the following history: -Bleeding started several weeks- maybe a month.  Not sure if a hysterectomy had been completed.  Red blood- at times likes like a "regular period."  Typically using about one pad per day.    In the chart, it was noted that she had a hysterectomy; however, neither the patient nor the daughter can recall if that was done.  CT completed 2016 did show a normal uterus and adnexa.  No LMP recorded. Patient is postmenopausal.    Review of Systems:   Pertinent items are noted in HPI Denies fever/chills, dizziness, headaches, visual disturbances, fatigue, shortness of breath, chest pain, abdominal pain, vomiting Pertinent History Reviewed:   Past Surgical History:  Procedure Laterality Date   APPENDECTOMY     CATARACT EXTRACTION W/PHACO Right 01/12/2022   Procedure: CATARACT EXTRACTION PHACO AND INTRAOCULAR LENS PLACEMENT (IOC);  Surgeon: [name], MD;  Location: [facility];  Service: Ophthalmology;  Laterality: Right;  CDE: 15.73   CATARACT EXTRACTION W/PHACO Left 08/20/2022   Procedure: CATARACT EXTRACTION PHACO AND INTRAOCULAR LENS PLACEMENT (IOC);  Surgeon: [name], MD;  Location: [facility];  Service: Ophthalmology;  Laterality: Left;  CDE 9.78   COLONOSCOPY  2005   Dr. Karilyn Cota: few small diverticula. external hemorrhoids   PACEMAKER IMPLANT N/A 05/31/2017   Procedure: PACEMAKER IMPLANT;  Surgeon: Marinus Maw, MD;  Location: [facility] INVASIVE CV LAB;  Service: Cardiovascular;  Laterality: N/A;    Past Medical History:  Diagnosis Date   Anxiety    Atrial fibrillation (HCC)    Chronic anticoagulation    Complete heart block (HCC)    a. s/p STJ PPM 05/2017.    Dementia [facility])    Depression    Essential hypertension    Hyperthyroidism 2000   2000   Osteoporosis    Presence of permanent cardiac pacemaker    Reviewed problem list, medications and allergies. Physical Assessment:   Vitals:   09/17/22 1507  BP: 136/77  Pulse: 70  Weight: 142 lb (64.4 kg)  Body mass index is 23.27 kg/m.       Physical Examination:   General appearance: alert, well appearing, and in no distress  Psych: mood appropriate, normal affect  Skin: warm & dry   Cardiovascular: normal heart rate noted  Respiratory: normal respiratory effort, no distress  Abdomen: soft, non-tender, no rebound, no guarding  Pelvic: VULVA: normal appearing vulva with no masses, tenderness or lesions VAGINA: normal appearing vagina with normal color and discharge, ~ 10cc of BRB noted in vaginal vault- c/w uterine bleeding CERVIX: normal appearing cervix without discharge or lesions.  Atrophic changes noted.  Os visualized.  See below for procedure  Extremities: no edema, no calf tenderness bilaterally  Endometrial Biopsy Procedure Note  Pre-operative Diagnosis: postmenopausal bleeding  Post-operative Diagnosis: same  Procedure Details  The risks (including infection, bleeding, pain, and uterine perforation) and benefits of the procedure were explained to the patient and Written informed consent was obtained.     The patient was placed in the dorsal lithotomy position.  Bimanual exam showed the uterus to be in the neutral position.  A speculum inserted in the vagina, and the cervix prepped with betadine.  A single tooth tenaculum was applied to the anterior lip of the cervix for stabilization.  Os finder was used.  A Pipelle endometrial aspirator was used to sample the endometrium.  Sample was sent for pathologic examination.  Condition: Stable  Complications: None  Chaperone: Faith Rogue    Assessment & Plan:  1) Postmenopausal bleeding -discussed potential causes, EMB  collected today.  Next step pending results of pathology.  Concern for endometrial Ca -plan to start on progestin therapy -pelvic US ordered -further management/referral pending results of ultrasound and biopsy   Orders Placed This Encounter  Procedures   US PELVIC COMPLETE WITH TRANSVAGINAL    Return for please schedule pelvic US.   Myna Hidalgo, DO Attending Obstetrician & Gynecologist, [facility] for [employer], [facility]

## 2022-09-19 ENCOUNTER — Telehealth: Payer: Self-pay | Admitting: Obstetrics & Gynecology

## 2022-09-19 ENCOUNTER — Other Ambulatory Visit: Payer: Self-pay

## 2022-09-19 ENCOUNTER — Other Ambulatory Visit: Payer: Self-pay | Admitting: Obstetrics & Gynecology

## 2022-09-19 DIAGNOSIS — C541 Malignant neoplasm of endometrium: Secondary | ICD-10-CM

## 2022-09-19 NOTE — Progress Notes (Signed)
Called daughter and reviewed results of recent biopsy -plan to stop progestin (not endometrioid type carcinoma) -spoke with Dr. Tamela Oddi recommend CT and CXR for evaluation of metastatic disease -plan for referral to gyn/onc to review management options -daughter is not likely to consider aggressive intervention, but wanted to know expectations, options, etc  Myna Hidalgo, DO Attending Obstetrician & Gynecologist, [facility] for [employer], [facility]

## 2022-09-20 LAB — SURGICAL PATHOLOGY

## 2022-09-25 NOTE — Progress Notes (Signed)
Remote pacemaker transmission.   

## 2022-09-26 ENCOUNTER — Telehealth: Payer: Self-pay

## 2022-09-26 NOTE — Telephone Encounter (Signed)
Spoke with the patient regarding the referral to GYN oncology. Patient scheduled as new patient with Dr Pricilla Holm on 10/12/2022. Patient given an arrival time of 10:00am.  Explained to the patient the the doctor will perform a pelvic exam at this visit. Patient given the policy that only one visitor allowed and that visitor must be over 16 yrs are allowed in the Cancer Center. Patient given the address/phone number for the clinic and that the center offers free valet service. Patient aware that masks are option.

## 2022-09-27 ENCOUNTER — Telehealth: Payer: Self-pay

## 2022-09-27 NOTE — Telephone Encounter (Signed)
Per Dr. Pricilla Holm, I spoke to pt's daughter, Renee Cameron, regarding moving Renee Cameron appointment from 10/4 to 9/20. Renee Cameron declined moving appointment stating they are going out of town this afternoon and will not be back until 10/08/22. Keeping original appointment of 10/4 @ 10:30am

## 2022-10-10 ENCOUNTER — Encounter: Payer: Self-pay | Admitting: Gynecologic Oncology

## 2022-10-10 NOTE — Progress Notes (Unsigned)
Gynecologic Oncology Telehealth Consult Note: Gyn-Onc  I connected with Renee Cameron daughter and POA, Renee Cameron, on 10/14/22 at 10:30 AM EDT by telephone and verified that I am speaking with the correct person using two identifiers.  I discussed the limitations, risks, security and privacy concerns of performing an evaluation and management service by telemedicine and the availability of in-person appointments. I also discussed with the patient that there may be a patient responsible charge related to this service. The patient expressed understanding and agreed to proceed.  Other persons participating in the visit and their role in the encounter: none.  Patient's location: home, [place] Provider's location: [facility], [place]  Assessment/Plan:  [name]  is a 89 y.o.  year old with uterine carcinosarcoma.  The patient was no longer with her daughter and POA, Renee Cameron, when I called. We reviewed the patient's history to the best of our ability and I spent some time discussing her recent diagnosis and treatment options.   We reviewed the nature of endometrial cancer and its recommended surgical staging if patients who are healthy enough and accepting of surgery, including total hysterectomy, bilateral salpingo-oophorectomy, and lymph node assessment. We reviewed that a minimally invasive approach is typically recommended for staging surgery.   We discussed that most endometrial cancer is detected early, however, we reviewed that adjuvant therapy would likely be recommended based on the patient's biopsy, however, we will defer to final pathology results.   Given her high risk histology, I would typically recommend CT scan preoperatively to rule out metastatic disease.  We specifically discussed the biphasic cancer. Unfortunately, these are typically composed of a high grade carcinoma component that does not respond to hormonal therapy in the way that a low grade endometrioid carcinoma might. In  cases of metastatic disease, we sometimes start with neoadjuvant chemotherapy prior to surgery. We discussed the role of radiation, which is typically used for consolidative treatment in the adjuvant setting (vaginal brachytherapy to decrease the risk of cuff recurrence in combination of systemic platinum-based therapy).   The patient has significant concerns about her mother's fitness for surgery. I share some of these given her age, medical comorbidities, and dementia. While I don't think it would be unreasonable to consider surgery (with clearance and goal of limiting time under anesthesia knowing this would be a deviation from typical surgery staging of this histology), I think it would be very reasonable to consider a more palliative approach.   If surgery is deferred, we discussed that a CT scan could still be performed to help with expectations regarding symptoms and disease status. We discussed possible role for palliative radiation to help with bleeding (patient's only symptom at this time is vaginal bleeding).   Renee Cameron would like some time to think about these options and to try to discuss with her mother. We agreed on a phone visit on 10/15 at 16:30.   I discussed the assessment and treatment plan with the patient's POA. She was provided with an opportunity to ask questions and all were answered. The patient agreed with the plan and demonstrated an understanding of the instructions.   The patient's POA was advised to call back or see an in-person evaluation if the symptoms worsen or if the condition fails to improve as anticipated.   I provided 45 minutes of non face-to-face telephone visit time during this encounter, and > 50% was spent counseling as documented under my assessment & plan.  A copy of this note was sent to  the patient's referring provider.   Renee Garnet, MD  Division of Gynecologic Oncology  Department of Obstetrics and Gynecology  [facility]  ___________________________________________  Chief Complaint: Chief Complaint  Patient presents with   Malignant neoplasm of endometrium [facility])   HISTORY OF PRESENT ILLNESS: Consult was requested by Dr. Charlotta Cameron for the evaluation of Renee Cameron, 89 y.o. female with new diagnoses of endometrial cancer.  The patient developed postmenopausal bleeding in August 2024. The patient and her daughter were under the impression that she'd had a hysterectomy but CT imaging (from 2016) confirms uterus in situ. EMB was performed on 9/9 and revealed carcinosarcoma, MMRp.  Spoke with her daughter by phone. Started having having vaginal bleeding 2 months ago (reported by facility staff who help her). At night, putting a pad on her bed (bleeding through her pad at night). She is not endorsing or acting like she is having pain. Reportedly normal bowel and bladder function. No weight changes, good appetite.   Patient has dementia (moderate memory issues), some hearing loss.  Patient lives in [place]. Her daughter is worried about the patient's depression and anxiety. Doesn't think patient understands that she has cancer.   PAST MEDICAL HISTORY:  Past Medical History:  Diagnosis Date   Anxiety    Atrial fibrillation (HCC)    Chronic anticoagulation    Complete heart block (HCC)    a. s/p STJ PPM 05/2017.   Dementia [facility])    Depression    Essential hypertension    Hyperthyroidism 2000   2000   Macular degeneration    Osteoporosis    Presence of permanent cardiac pacemaker      PAST SURGICAL HISTORY:  Past Surgical History:  Procedure Laterality Date   APPENDECTOMY     CATARACT EXTRACTION W/PHACO Right 01/12/2022   Procedure: CATARACT EXTRACTION PHACO AND INTRAOCULAR LENS PLACEMENT (IOC);  Surgeon: Renee Pierce, MD;  Location: [facility];  Service: Ophthalmology;  Laterality: Right;  CDE: 15.73   CATARACT EXTRACTION W/PHACO Left 08/20/2022   Procedure: CATARACT EXTRACTION PHACO AND  INTRAOCULAR LENS PLACEMENT (IOC);  Surgeon: Renee Pierce, MD;  Location: [facility];  Service: Ophthalmology;  Laterality: Left;  CDE 9.78   COLONOSCOPY  2005   Dr. Karilyn Cota: few small diverticula. external hemorrhoids   PACEMAKER IMPLANT N/A 05/31/2017   Procedure: PACEMAKER IMPLANT;  Surgeon: Marinus Maw, MD;  Location: [facility] INVASIVE CV LAB;  Service: Cardiovascular;  Laterality: N/A;    OB/GYN HISTORY:  OB History  Gravida Para Term Preterm AB Living  0 0 0 0 0 0  SAB IAB Ectopic Multiple Live Births  0 0 0 0 0  Obstetric Comments  Adopted daughter Renee Cameron    No LMP recorded. Patient is postmenopausal.  SCREENING STUDIES:  Last mammogram: 2012  Last colonoscopy: 10-15 years  MEDICATIONS: Outpatient Encounter Medications as of 10/12/2022  Medication Sig   acetaminophen (TYLENOL) 325 MG tablet Take 2 tablets (650 mg total) by mouth every 6 (six) hours as needed.   ALPRAZolam (XANAX) 0.25 MG tablet Take 0.25 mg by mouth 2 (two) times daily as needed.   atorvastatin (LIPITOR) 40 MG tablet Take 40 mg by mouth daily.   Cholecalciferol 50 MCG (2000 UT) CAPS Take 1 capsule by mouth daily.   cyanocobalamin (VITAMIN B12) 500 MCG tablet Take 500 mcg by mouth every other day. Take 1 tablet by mouth every Monday Wednesday and Friday   desvenlafaxine (PRISTIQ) 50 MG 24 hr tablet Take 1 tablet  by mouth daily.   donepezil (ARICEPT) 5 MG tablet Take 5 mg by mouth at bedtime.   famotidine (PEPCID) 40 MG tablet Take 40 mg by mouth daily.   furosemide (LASIX) 20 MG tablet Take 20 mg by mouth daily.   losartan (COZAAR) 25 MG tablet Take 25 mg by mouth daily.   medroxyPROGESTERone (PROVERA) 10 MG tablet Take 1 tablet (10 mg total) by mouth daily.   MEGARED OMEGA-3 KRILL OIL 500 MG CAPS Take 500 mg by mouth daily at 12 noon.   memantine (NAMENDA) 10 MG tablet Take 1 tablet by mouth daily.   metoprolol succinate (TOPROL-XL) 12.5 mg TB24 24 hr tablet Take 12.5 mg by mouth daily.   Multiple  Vitamins-Minerals (PRESERVISION AREDS 2+MULTI VIT) CAPS Take 2 capsules by mouth daily.   Polyvinyl Alcohol-Povidone (REFRESH OP) Apply 1 drop to eye in the morning, at noon, and at bedtime. 1 drop in both eyes 3 times a day   potassium chloride (K-DUR,KLOR-CON) 10 MEQ tablet Take 1 tablet by mouth daily.   XARELTO 15 MG TABS tablet TAKE 1 TABLET(15 MG) BY MOUTH DAILY WITH DINNER   No facility-administered encounter medications on file as of 10/12/2022.    ALLERGIES:  No Known Allergies   FAMILY HISTORY:  Family History  Problem Relation Age of Onset   Prostate cancer Father    Stroke Father    Heart disease Mother    Coronary artery disease Sister        with prior CABG surgery     SOCIAL HISTORY:  Social Connections: Not on file    Review of Systems: Unable to perform, see HPI  Vitals:  not available due to nature of this virtual visit  Physical Exam: Unable to perform due to nature of this virtual visit  LABORATORY AND RADIOLOGIC DATA:  Outside medical records were reviewed to synthesize the above history, along with the history and physical obtained during the visit.   Lab Results  Component Value Date   WBC 5.8 05/09/2022   HGB 12.9 05/09/2022   HCT 40.7 05/09/2022   PLT 187 05/09/2022   GLUCOSE 165 (H) 05/09/2022   CHOL 192 08/14/2010   TRIG 165 (H) 08/14/2010   HDL 46 08/14/2010   LDLCALC 113 (H) 08/14/2010   ALT 15 02/11/2021   AST 23 02/11/2021   NA 140 05/09/2022   K 4.6 05/09/2022   CL 100 05/09/2022   CREATININE 0.93 05/09/2022   BUN 10 05/09/2022   CO2 26 05/09/2022   TSH 4.860 (H) 02/11/2021   INR 1.20 09/28/2017     Carver Fila, MD  10/14/2022, 9:11 AM

## 2022-10-12 ENCOUNTER — Encounter: Payer: Self-pay | Admitting: Gynecologic Oncology

## 2022-10-12 ENCOUNTER — Inpatient Hospital Stay: Payer: Medicare Other | Attending: Gynecologic Oncology | Admitting: Gynecologic Oncology

## 2022-10-12 ENCOUNTER — Ambulatory Visit: Payer: No Typology Code available for payment source | Admitting: Obstetrics & Gynecology

## 2022-10-12 ENCOUNTER — Other Ambulatory Visit: Payer: No Typology Code available for payment source

## 2022-10-12 DIAGNOSIS — I4821 Permanent atrial fibrillation: Secondary | ICD-10-CM | POA: Diagnosis not present

## 2022-10-12 DIAGNOSIS — F03B4 Unspecified dementia, moderate, with anxiety: Secondary | ICD-10-CM | POA: Diagnosis not present

## 2022-10-12 DIAGNOSIS — C541 Malignant neoplasm of endometrium: Secondary | ICD-10-CM

## 2022-10-12 DIAGNOSIS — I442 Atrioventricular block, complete: Secondary | ICD-10-CM | POA: Diagnosis not present

## 2022-10-16 DIAGNOSIS — C541 Malignant neoplasm of endometrium: Secondary | ICD-10-CM | POA: Insufficient documentation

## 2022-10-23 ENCOUNTER — Encounter: Payer: Self-pay | Admitting: Gynecologic Oncology

## 2022-10-23 ENCOUNTER — Inpatient Hospital Stay: Payer: Medicare Other | Admitting: Gynecologic Oncology

## 2022-10-23 DIAGNOSIS — C541 Malignant neoplasm of endometrium: Secondary | ICD-10-CM

## 2022-10-23 DIAGNOSIS — F03B4 Unspecified dementia, moderate, with anxiety: Secondary | ICD-10-CM | POA: Diagnosis not present

## 2022-10-23 DIAGNOSIS — Z7189 Other specified counseling: Secondary | ICD-10-CM

## 2022-10-23 NOTE — Progress Notes (Signed)
Gynecologic Oncology Telehealth Note: Gyn-Onc  I connected with Renee Cameron on 10/23/22 at  4:15 PM EDT by telephone and verified that I am speaking with the correct person using two identifiers.  I discussed the limitations, risks, security and privacy concerns of performing an evaluation and management service by telemedicine and the availability of in-person appointments. I also discussed with the patient that there may be a patient responsible charge related to this service. The patient expressed understanding and agreed to proceed.  Other persons participating in the visit and their role in the encounter: patient's daughter and POA, Renee Cameron.  Patient's location: home, [place] Provider's location: [place]  Reason for Visit: treatment planning  Treatment History: The patient developed postmenopausal bleeding in August 2024. The patient and her daughter were under the impression that she'd had a hysterectomy but CT imaging (from 2016) confirms uterus in situ. EMB was performed on 9/9 and revealed carcinosarcoma, MMRp.   Spoke with her daughter by phone. Started having having vaginal bleeding 2 months ago (reported by facility staff who help her). At night, putting a pad on her bed (bleeding through her pad at night). She is not endorsing or acting like she is having pain. Reportedly normal bowel and bladder function. No weight changes, good appetite.  Patient has dementia (moderate memory issues), some hearing loss.  Patient lives in [place]. Her daughter is worried about the patient's depression and anxiety. Doesn't think patient understands that she has cancer.   Interval History: Denies any significant changes to mother's status. No change in bleeding - typically using 2 pads a day.   Past Medical/Surgical History: Past Medical History:  Diagnosis Date   Anxiety    Atrial fibrillation (HCC)    Chronic anticoagulation    Complete heart block (HCC)    a. s/p STJ PPM 05/2017.   Dementia  [facility])    Depression    Essential hypertension    Hyperthyroidism 2000   2000   Macular degeneration    Osteoporosis    Presence of permanent cardiac pacemaker     Past Surgical History:  Procedure Laterality Date   APPENDECTOMY     CATARACT EXTRACTION W/PHACO Right 01/12/2022   Procedure: CATARACT EXTRACTION PHACO AND INTRAOCULAR LENS PLACEMENT (IOC);  Surgeon: [name], MD;  Location: [facility];  Service: Ophthalmology;  Laterality: Right;  CDE: 15.73   CATARACT EXTRACTION W/PHACO Left 08/20/2022   Procedure: CATARACT EXTRACTION PHACO AND INTRAOCULAR LENS PLACEMENT (IOC);  Surgeon: [name], MD;  Location: [facility];  Service: Ophthalmology;  Laterality: Left;  CDE 9.78   COLONOSCOPY  2005   Dr. Karilyn Cota: few small diverticula. external hemorrhoids   PACEMAKER IMPLANT N/A 05/31/2017   Procedure: PACEMAKER IMPLANT;  Surgeon: Marinus Maw, MD;  Location: [facility] INVASIVE CV LAB;  Service: Cardiovascular;  Laterality: N/A;    Family History  Problem Relation Age of Onset   Prostate cancer Father    Stroke Father    Heart disease Mother    Coronary artery disease Sister        with prior CABG surgery    Social History   Socioeconomic History   Marital status: Widowed    Spouse name: Not on file   Number of children: Not on file   Years of education: Not on file   Highest education level: Not on file  Occupational History   Occupation: Retired    Comment: [employer]  Tobacco Use   Smoking status: Never   Smokeless tobacco:  Never  Vaping Use   Vaping status: Never Used  Substance and Sexual Activity   Alcohol use: No    Alcohol/week: 0.0 standard drinks of alcohol   Drug use: No   Sexual activity: Not Currently  Other Topics Concern   Not on file  Social History Narrative   Volunteers 2 days weekly   Social Determinants of Health   Financial Resource Strain: Not on file  Food Insecurity: No Food Insecurity (10/10/2022)   Hunger Vital Sign     Worried About Running Out of Food in the Last Year: Never true    Ran Out of Food in the Last Year: Never true  Transportation Needs: No Transportation Needs (10/10/2022)   PRAPARE - Administrator, Civil Service (Medical): No    Lack of Transportation (Non-Medical): No  Physical Activity: Not on file  Stress: Not on file  Social Connections: Not on file    Current Medications:  Current Outpatient Medications:    acetaminophen (TYLENOL) 325 MG tablet, Take 2 tablets (650 mg total) by mouth every 6 (six) hours as needed., Disp: 30 tablet, Rfl: 0   ALPRAZolam (XANAX) 0.25 MG tablet, Take 0.25 mg by mouth 2 (two) times daily as needed., Disp: , Rfl:    atorvastatin (LIPITOR) 40 MG tablet, Take 40 mg by mouth daily., Disp: , Rfl:    Cholecalciferol 50 MCG (2000 UT) CAPS, Take 1 capsule by mouth daily., Disp: , Rfl:    cyanocobalamin (VITAMIN B12) 500 MCG tablet, Take 500 mcg by mouth every other day. Take 1 tablet by mouth every Monday Wednesday and Friday, Disp: , Rfl:    desvenlafaxine (PRISTIQ) 50 MG 24 hr tablet, Take 1 tablet by mouth daily., Disp: , Rfl:    donepezil (ARICEPT) 5 MG tablet, Take 5 mg by mouth at bedtime., Disp: , Rfl: 2   famotidine (PEPCID) 40 MG tablet, Take 40 mg by mouth daily., Disp: , Rfl:    furosemide (LASIX) 20 MG tablet, Take 20 mg by mouth daily., Disp: , Rfl:    losartan (COZAAR) 25 MG tablet, Take 25 mg by mouth daily., Disp: , Rfl:    medroxyPROGESTERone (PROVERA) 10 MG tablet, Take 1 tablet (10 mg total) by mouth daily., Disp: 30 tablet, Rfl: 11   MEGARED OMEGA-3 KRILL OIL 500 MG CAPS, Take 500 mg by mouth daily at 12 noon., Disp: , Rfl:    memantine (NAMENDA) 10 MG tablet, Take 1 tablet by mouth daily., Disp: , Rfl:    metoprolol succinate (TOPROL-XL) 12.5 mg TB24 24 hr tablet, Take 12.5 mg by mouth daily., Disp: , Rfl:    Multiple Vitamins-Minerals (PRESERVISION AREDS 2+MULTI VIT) CAPS, Take 2 capsules by mouth daily., Disp: , Rfl:     Polyvinyl Alcohol-Povidone (REFRESH OP), Apply 1 drop to eye in the morning, at noon, and at bedtime. 1 drop in both eyes 3 times a day, Disp: , Rfl:    potassium chloride (K-DUR,KLOR-CON) 10 MEQ tablet, Take 1 tablet by mouth daily., Disp: , Rfl:    XARELTO 15 MG TABS tablet, TAKE 1 TABLET(15 MG) BY MOUTH DAILY WITH DINNER, Disp: 30 tablet, Rfl: 6  Review of Symptoms: Pertinent positives as per HPI.  Physical Exam: Deferred given limitations of phone visit.  Laboratory & Radiologic Studies: None new  Assessment & Plan: Renee Cameron is a 89 y.o. woman with uterine carcinosarcoma.   Renee Cameron has talked to the patient as well as her daughter. Does not feel that chemotherapy  or radiation are possible at her age. Concerned about anesthesia risk given age and dementia.   Discussed that if bleeding were to increase, we could do a shorter course of radiation (palliative radiation) to help with bleeding. The daughter will keep [place] posted if bleeding changes.  I discussed the assessment and treatment plan with the patient. The patient was provided with an opportunity to ask questions and all were answered. The patient agreed with the plan and demonstrated an understanding of the instructions.   The patient was advised to call back or see an in-person evaluation if the symptoms worsen or if the condition fails to improve as anticipated.   8 minutes of total time was spent for this patient encounter, including preparation, phone counseling with the patient and coordination of care, and documentation of the encounter.   Eugene Garnet, MD  Division of Gynecologic Oncology  Department of Obstetrics and Gynecology  [facility]

## 2022-12-12 ENCOUNTER — Ambulatory Visit: Payer: Medicare Other

## 2022-12-12 DIAGNOSIS — I442 Atrioventricular block, complete: Secondary | ICD-10-CM

## 2022-12-12 LAB — CUP PACEART REMOTE DEVICE CHECK
Battery Remaining Longevity: 38 mo
Battery Remaining Percentage: 36 %
Battery Voltage: 2.98 V
Brady Statistic AP VP Percent: 99 %
Brady Statistic AP VS Percent: 1 %
Brady Statistic AS VP Percent: 1 %
Brady Statistic AS VS Percent: 1 %
Brady Statistic RA Percent Paced: 99 %
Brady Statistic RV Percent Paced: 99 %
Date Time Interrogation Session: 20241204020014
Implantable Lead Connection Status: 753985
Implantable Lead Connection Status: 753985
Implantable Lead Implant Date: 20190524
Implantable Lead Implant Date: 20190524
Implantable Lead Location: 753859
Implantable Lead Location: 753860
Implantable Pulse Generator Implant Date: 20190524
Lead Channel Impedance Value: 430 Ohm
Lead Channel Impedance Value: 460 Ohm
Lead Channel Pacing Threshold Amplitude: 0.5 V
Lead Channel Pacing Threshold Amplitude: 0.625 V
Lead Channel Pacing Threshold Pulse Width: 0.5 ms
Lead Channel Pacing Threshold Pulse Width: 0.5 ms
Lead Channel Sensing Intrinsic Amplitude: 1.1 mV
Lead Channel Sensing Intrinsic Amplitude: 12 mV
Lead Channel Setting Pacing Amplitude: 0.875
Lead Channel Setting Pacing Amplitude: 2 V
Lead Channel Setting Pacing Pulse Width: 0.5 ms
Lead Channel Setting Sensing Sensitivity: 2 mV
Pulse Gen Model: 2272
Pulse Gen Serial Number: 9022503

## 2022-12-18 ENCOUNTER — Encounter: Payer: Self-pay | Admitting: Cardiology

## 2022-12-18 ENCOUNTER — Ambulatory Visit: Payer: Medicare Other | Attending: Cardiology | Admitting: Cardiology

## 2022-12-18 VITALS — BP 110/60 | HR 65 | Ht 64.0 in | Wt 143.0 lb

## 2022-12-18 DIAGNOSIS — I4821 Permanent atrial fibrillation: Secondary | ICD-10-CM | POA: Insufficient documentation

## 2022-12-18 DIAGNOSIS — Z79899 Other long term (current) drug therapy: Secondary | ICD-10-CM | POA: Insufficient documentation

## 2022-12-18 DIAGNOSIS — I10 Essential (primary) hypertension: Secondary | ICD-10-CM | POA: Insufficient documentation

## 2022-12-18 DIAGNOSIS — I442 Atrioventricular block, complete: Secondary | ICD-10-CM | POA: Diagnosis present

## 2022-12-18 NOTE — Patient Instructions (Signed)
Medication Instructions:  ?Your physician recommends that you continue on your current medications as directed. Please refer to the Current Medication list given to you today. ? ? ?Labwork: ?CBC,BMET ? ? ?Testing/Procedures: ?None today ? ?Follow-Up: ?6 months ? ?Any Other Special Instructions Will Be Listed Below (If Applicable). ? ?If you need a refill on your cardiac medications before your next appointment, please call your pharmacy. ? ?

## 2022-12-18 NOTE — Progress Notes (Signed)
    Cardiology Office Note  Date: 12/18/2022   ID: Delonda, Declerck Apr 02, 1924, MRN [id number]  History of Present Illness: Renee Cameron is a 89 y.o. female last seen in June.  She is here today with her daughter for a follow-up visit.  She continues to reside at [place].  She does not report any shortness of breath or palpitations.  Using a walker to ambulate.  I did review interval chart, she has been diagnosed with uterine carcinosarcoma with plan for conservative management and palliative care.  She is not aware of the diagnosis.  I reviewed her medications.  Current regimen includes Xarelto, Toprol-XL, Cozaar, Lasix with potassium supplement, and Lipitor.  Blood pressure is well-controlled today.  St .Jude pacemaker in place with follow-up by Dr. Ladona Ridgel.  Device interrogation recently showed normal function.  Physical Exam: VS:  BP 110/60   Pulse 65   Ht 5\' 4"  (1.626 m)   Wt 143 lb (64.9 kg)   SpO2 100%   BMI 24.55 kg/m , BMI Body mass index is 24.55 kg/m.  Wt Readings from Last 3 Encounters:  12/18/22 143 lb (64.9 kg)  09/17/22 142 lb (64.4 kg)  08/20/22 141 lb 1.5 oz (64 kg)    General: Patient appears comfortable at rest. HEENT: Conjunctiva and lids normal. Neck: Supple, no elevated JVP or carotid bruits. Lungs: Clear to auscultation, nonlabored breathing at rest. Cardiac: Regular rate and rhythm, no S3, 1/6 systolic murmur. Extremities: No pitting edema.  ECG:  An ECG dated 03/07/2022 was personally reviewed today and demonstrated:  Dual-chamber paced rhythm.  Labwork: 05/09/2022: BUN 10; Creatinine, Ser 0.93; Hemoglobin 12.9; Platelets 187; Potassium 4.6; Sodium 140   Other Studies Reviewed Today:  No interval cardiac testing for review today.  Assessment and Plan:  1.  Permanent atrial fibrillation with CHA2DS2-VASc score of 4.  She is asymptomatic, continues on Toprol-XL and Xarelto.  Check CBC and BMET.   2.  Complete heart block status post St.  Jude pacemaker with follow-up by Dr. Ladona Ridgel.  Recent device interrogation indicated normal function.   3.  Primary hypertension.  Blood pressure well-controlled today on current regimen which also includes Cozaar.  Disposition:  Follow up  6 months.  Signed, Jonelle Sidle, M.D., F.A.C.C. [facility]

## 2023-01-09 LAB — BASIC METABOLIC PANEL
BUN: 11 mg/dL (ref 7–25)
CO2: 27 mmol/L (ref 20–32)
Calcium: 9.4 mg/dL (ref 8.6–10.4)
Chloride: 101 mmol/L (ref 98–110)
Creat: 0.85 mg/dL (ref 0.60–0.95)
Glucose, Bld: 109 mg/dL — ABNORMAL HIGH (ref 65–99)
Potassium: 4.7 mmol/L (ref 3.5–5.3)
Sodium: 138 mmol/L (ref 135–146)

## 2023-01-09 LAB — CBC
HCT: 26.8 % — ABNORMAL LOW (ref 35.0–45.0)
Hemoglobin: 7.9 g/dL — ABNORMAL LOW (ref 11.7–15.5)
MCH: 25.2 pg — ABNORMAL LOW (ref 27.0–33.0)
MCHC: 29.5 g/dL — ABNORMAL LOW (ref 32.0–36.0)
MCV: 85.6 fL (ref 80.0–100.0)
MPV: 9.7 fL (ref 7.5–12.5)
Platelets: 338 10*3/uL (ref 140–400)
RBC: 3.13 10*6/uL — ABNORMAL LOW (ref 3.80–5.10)
RDW: 13.5 % (ref 11.0–15.0)
WBC: 6.4 10*3/uL (ref 3.8–10.8)

## 2023-01-10 ENCOUNTER — Telehealth: Payer: Self-pay

## 2023-01-10 NOTE — Telephone Encounter (Signed)
 Patients daughter notified and verbalized understanding. Pt's daughter had no questions or concerns at this time. Jonn Shingles- notified and verbalized understanding. Faxed copy to facility.

## 2023-01-10 NOTE — Telephone Encounter (Signed)
-----   Message from Jayson Sierras sent at 01/09/2023  4:01 PM EST ----- Results reviewed.  Renal function remains stable.  Hemoglobin has dropped to 7.9 from 12.9 based on lab work from August 2024.  She is on Xarelto  for stroke prophylaxis with permanent atrial fibrillation.  Also active diagnosis of uterine carcinosarcoma with plan for conservative management and palliative care.  Given drop in hemoglobin, I would be concerned about GU bleeding and it may be best to stop anticoagulation altogether at this point given the situation.  Please communicate with the patient's daughter who was with her at the recent office visit about this finding and my recommendations (patient is not aware of her cancer diagnosis).  Also need to discuss with [facility] to make sure that the providers looking after her at that facility are aware.

## 2023-02-09 ENCOUNTER — Encounter (HOSPITAL_COMMUNITY): Payer: Self-pay | Admitting: Emergency Medicine

## 2023-02-09 ENCOUNTER — Other Ambulatory Visit: Payer: Self-pay

## 2023-02-09 ENCOUNTER — Inpatient Hospital Stay (HOSPITAL_COMMUNITY)
Admission: EM | Admit: 2023-02-09 | Discharge: 2023-02-12 | DRG: 176 | Disposition: A | Discharge status: Home Health | Payer: Medicare Other | Source: Skilled Nursing Facility | Attending: Internal Medicine | Admitting: Internal Medicine

## 2023-02-09 DIAGNOSIS — Z79899 Other long term (current) drug therapy: Secondary | ICD-10-CM

## 2023-02-09 DIAGNOSIS — M810 Age-related osteoporosis without current pathological fracture: Secondary | ICD-10-CM | POA: Diagnosis present

## 2023-02-09 DIAGNOSIS — R7989 Other specified abnormal findings of blood chemistry: Secondary | ICD-10-CM | POA: Diagnosis present

## 2023-02-09 DIAGNOSIS — F0394 Unspecified dementia, unspecified severity, with anxiety: Secondary | ICD-10-CM | POA: Diagnosis present

## 2023-02-09 DIAGNOSIS — E782 Mixed hyperlipidemia: Secondary | ICD-10-CM | POA: Diagnosis present

## 2023-02-09 DIAGNOSIS — Z79890 Hormone replacement therapy (postmenopausal): Secondary | ICD-10-CM

## 2023-02-09 DIAGNOSIS — I340 Nonrheumatic mitral (valve) insufficiency: Secondary | ICD-10-CM | POA: Diagnosis present

## 2023-02-09 DIAGNOSIS — I10 Essential (primary) hypertension: Secondary | ICD-10-CM | POA: Diagnosis present

## 2023-02-09 DIAGNOSIS — H9190 Unspecified hearing loss, unspecified ear: Secondary | ICD-10-CM | POA: Diagnosis present

## 2023-02-09 DIAGNOSIS — C55 Malignant neoplasm of uterus, part unspecified: Secondary | ICD-10-CM | POA: Diagnosis present

## 2023-02-09 DIAGNOSIS — Z823 Family history of stroke: Secondary | ICD-10-CM

## 2023-02-09 DIAGNOSIS — R748 Abnormal levels of other serum enzymes: Secondary | ICD-10-CM | POA: Diagnosis present

## 2023-02-09 DIAGNOSIS — I4821 Permanent atrial fibrillation: Secondary | ICD-10-CM | POA: Diagnosis present

## 2023-02-09 DIAGNOSIS — Z66 Do not resuscitate: Secondary | ICD-10-CM | POA: Diagnosis present

## 2023-02-09 DIAGNOSIS — Z950 Presence of cardiac pacemaker: Secondary | ICD-10-CM

## 2023-02-09 DIAGNOSIS — D649 Anemia, unspecified: Secondary | ICD-10-CM | POA: Diagnosis present

## 2023-02-09 DIAGNOSIS — I2699 Other pulmonary embolism without acute cor pulmonale: Principal | ICD-10-CM | POA: Diagnosis present

## 2023-02-09 DIAGNOSIS — F0390 Unspecified dementia without behavioral disturbance: Secondary | ICD-10-CM | POA: Diagnosis present

## 2023-02-09 DIAGNOSIS — Z8249 Family history of ischemic heart disease and other diseases of the circulatory system: Secondary | ICD-10-CM

## 2023-02-09 DIAGNOSIS — I119 Hypertensive heart disease without heart failure: Secondary | ICD-10-CM | POA: Diagnosis present

## 2023-02-09 DIAGNOSIS — Z515 Encounter for palliative care: Secondary | ICD-10-CM

## 2023-02-09 DIAGNOSIS — I442 Atrioventricular block, complete: Secondary | ICD-10-CM | POA: Diagnosis present

## 2023-02-09 DIAGNOSIS — R1110 Vomiting, unspecified: Secondary | ICD-10-CM | POA: Diagnosis present

## 2023-02-09 DIAGNOSIS — D72829 Elevated white blood cell count, unspecified: Secondary | ICD-10-CM | POA: Diagnosis present

## 2023-02-09 DIAGNOSIS — Z8042 Family history of malignant neoplasm of prostate: Secondary | ICD-10-CM

## 2023-02-09 DIAGNOSIS — K219 Gastro-esophageal reflux disease without esophagitis: Secondary | ICD-10-CM | POA: Diagnosis present

## 2023-02-09 MED ORDER — ONDANSETRON HCL 4 MG/2ML IJ SOLN
4.0000 mg | Freq: Four times a day (QID) | INTRAMUSCULAR | Status: DC | PRN
  Administered 2023-02-10 (×2): 4 mg via INTRAVENOUS
  Filled 2023-02-09 (×3): qty 2

## 2023-02-09 MED ORDER — LACTATED RINGERS IV BOLUS
500.0000 mL | Freq: Once | INTRAVENOUS | Status: AC
  Administered 2023-02-10: 500 mL via INTRAVENOUS

## 2023-02-09 NOTE — ED Provider Notes (Signed)
[place] EM[facility]ENC[facility] DEPARTMENT AT [facility] Provider Note   CSN: [id number] Arrival date & time: 02/09/23  2329     History {Add pertinent medical, surgical, social history, OB history to HPI:1} Chief Complaint  Patient presents with   Emesis    MAISY NEWPORT is a 89 y.o. female.   Emesis Patient presents for emesis.  Medical history includes atrial fibrillation, HTN, dementia, heart block s/p pacemaker placement, anxiety, osteoporosis, endometrial cancer.  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  She has had a recent drop in hemoglobin.  She is on Xarelto.  Per EMS, onset of emesis was 1 hour prior to arrival.  History from patient limited by dementia and hearing loss.     Home Medications Prior to Admission medications   Medication Sig Start Date End Date Taking? Authorizing Provider  acetaminophen (TYLENOL) 325 MG tablet Take 2 tablets (650 mg total) by mouth every 6 (six) hours as needed. 03/07/22   Cristopher Peru, PA-C  ALPRAZolam Prudy Feeler) 0.25 MG tablet Take 0.25 mg by mouth 2 (two) times daily as needed. 10/02/19   [provider]  atorvastatin (LIPITOR) 40 MG tablet Take 40 mg by mouth daily.    [provider]  Cholecalciferol 50 MCG (2000 UT) CAPS Take 1 capsule by mouth daily.    [provider]  cyanocobalamin (VITAMIN B12) 500 MCG tablet Take 500 mcg by mouth every other day. Take 1 tablet by mouth every Monday Wednesday and Friday    [provider]  desvenlafaxine (PRISTIQ) 50 MG 24 hr tablet Take 1 tablet by mouth daily. 05/18/17   [provider]  donepezil (ARICEPT) 5 MG tablet Take 5 mg by mouth at bedtime. 04/09/17   [provider]  famotidine (PEPCID) 40 MG tablet Take 40 mg by mouth daily.    [provider]  furosemide (LASIX) 20 MG tablet Take 20 mg by mouth daily.    [provider]  losartan (COZAAR) 25 MG tablet Take 25 mg by  mouth daily. 07/21/20   [provider]  medroxyPROGESTERone (PROVERA) 10 MG tablet Take 1 tablet (10 mg total) by mouth daily. 09/17/22   Ozan, Jennifer, DO  MEGARED OMEGA-3 KRILL OIL 500 MG CAPS Take 500 mg by mouth daily at 12 noon.    [provider]  memantine (NAMENDA) 10 MG tablet Take 1 tablet by mouth daily. 04/20/17   [provider]  metoprolol succinate (TOPROL-XL) 12.5 mg TB24 24 hr tablet Take 12.5 mg by mouth daily.    [provider]  Multiple Vitamins-Minerals (PRESERVISION AREDS 2+MULTI VIT) CAPS Take 2 capsules by mouth daily.    [provider]  Polyvinyl Alcohol-Povidone (REFRESH OP) Apply 1 drop to eye in the morning, at noon, and at bedtime. 1 drop in both eyes 3 times a day    [provider]  potassium chloride (K-DUR,KLOR-CON) 10 MEQ tablet Take 1 tablet by mouth daily. 09/07/17   [provider]      Allergies    Patient has no known allergies.    Review of Systems   Review of Systems  Unable to perform ROS: Dementia  Gastrointestinal:  Positive for vomiting.    Physical Exam Updated Vital Signs BP (!) 152/60 (BP Location: Left Arm)   Pulse 71   Temp 98.8 F (37.1 C) (Oral)   Resp 18   Ht 5\' 4"  (1.626 m)   Wt 64.8 kg   SpO2  96%   BMI 24.53 kg/m  Physical Exam Vitals and nursing note reviewed.  Constitutional:      General: She is not in acute distress.    Appearance: Normal appearance. She is well-developed. She is not ill-appearing, toxic-appearing or diaphoretic.  HENT:     Head: Normocephalic and atraumatic.     Right Ear: External ear normal.     Left Ear: External ear normal.     Nose: Nose normal.     Mouth/Throat:     Mouth: Mucous membranes are moist.  Eyes:     Extraocular Movements: Extraocular movements intact.     Conjunctiva/sclera: Conjunctivae normal.  Cardiovascular:     Rate and Rhythm: Normal rate and regular rhythm.  Pulmonary:     Effort: Pulmonary effort is  normal. No respiratory distress.     Breath sounds: Normal breath sounds. No wheezing or rales.  Chest:     Chest wall: No tenderness.  Abdominal:     General: There is distension.     Palpations: Abdomen is soft.     Tenderness: There is abdominal tenderness. There is no guarding or rebound.  Musculoskeletal:        General: No swelling or deformity. Normal range of motion.     Cervical back: Normal range of motion and neck supple.     Right lower leg: No edema.     Left lower leg: No edema.  Skin:    General: Skin is warm and dry.     Coloration: Skin is not jaundiced or pale.  Neurological:     General: No focal deficit present.     Mental Status: She is alert and oriented to person, place, and time.  Psychiatric:        Mood and Affect: Mood normal.        Behavior: Behavior normal.     ED Results / Procedures / Treatments   Labs (all labs ordered are listed, but only abnormal results are displayed) Labs Reviewed - No data to display  EKG None  Radiology No results found.  Procedures Procedures  {Document cardiac monitor, telemetry assessment procedure when appropriate:1}  Medications Ordered in ED Medications - No data to display  ED Course/ Medical Decision Making/ A&P   {   Click here for ABCD2, HEART and other calculatorsREFRESH Note before signing :1}                              Medical Decision Making  This patient presents to the ED for concern of ***, this involves an extensive number of treatment options, and is a complaint that carries with it a high risk of complications and morbidity.  The differential diagnosis includes ***   Co morbidities that complicate the patient evaluation  ***   Additional history obtained:  Additional history obtained from *** External records from outside source obtained and reviewed including ***   Lab Tests:  I Ordered, and personally interpreted labs.  The pertinent results include:  ***   Imaging  Studies ordered:  I ordered imaging studies including ***  I independently visualized and interpreted imaging which showed *** I agree with the radiologist interpretation   Cardiac Monitoring: / EKG:  The patient was maintained on a cardiac monitor.  I personally viewed and interpreted the cardiac monitored which showed an underlying rhythm of: ***   Consultations Obtained:  I requested consultation with the ***,  and discussed  lab and imaging findings as well as pertinent plan - they recommend: ***   Problem List / ED Course / Critical interventions / Medication management  Patient presenting by EMS after episode of emesis this evening.  On arrival in the ED, vital signs are notable for widened pulse pressure.  She does have known anemia which has reportedly worsened recently.  Medical history also notable for recurrent uterine carcinosarcoma.  She is not on active treatment for this at this time.  Patient is overall well-appearing on exam.  She currently denies nausea or any areas of pain.  She does have abdominal distention and tenderness without guarding.  Workup was initiated.***. I ordered medication including ***  for ***  Reevaluation of the patient after these medicines showed that the patient {resolved/improved/worsened:23923::"improved"} I have reviewed the patients home medicines and have made adjustments as needed   Social Determinants of Health:  ***   Test / Admission - Considered:  ***   {Document critical care time when appropriate:1} {Document review of labs and clinical decision tools ie heart score, Chads2Vasc2 etc:1}  {Document your independent review of radiology images, and any outside records:1} {Document your discussion with family members, caretakers, and with consultants:1} {Document social determinants of health affecting pt's care:1} {Document your decision making why or why not admission, treatments were needed:1} Final Clinical Impression(s) /  ED Diagnoses Final diagnoses:  None    Rx / DC Orders ED Discharge Orders     None

## 2023-02-09 NOTE — ED Triage Notes (Signed)
Pt bib EMS from [facility] for c/o emesis. Staff reports pt vomited once around 2245.

## 2023-02-10 ENCOUNTER — Emergency Department (HOSPITAL_COMMUNITY): Payer: Medicare Other

## 2023-02-10 ENCOUNTER — Observation Stay (HOSPITAL_COMMUNITY): Payer: Medicare Other

## 2023-02-10 ENCOUNTER — Observation Stay (HOSPITAL_BASED_OUTPATIENT_CLINIC_OR_DEPARTMENT_OTHER): Payer: Medicare Other

## 2023-02-10 ENCOUNTER — Other Ambulatory Visit: Payer: Self-pay

## 2023-02-10 DIAGNOSIS — R748 Abnormal levels of other serum enzymes: Secondary | ICD-10-CM

## 2023-02-10 DIAGNOSIS — R1110 Vomiting, unspecified: Secondary | ICD-10-CM | POA: Diagnosis not present

## 2023-02-10 DIAGNOSIS — I2699 Other pulmonary embolism without acute cor pulmonale: Secondary | ICD-10-CM | POA: Diagnosis not present

## 2023-02-10 DIAGNOSIS — K219 Gastro-esophageal reflux disease without esophagitis: Secondary | ICD-10-CM

## 2023-02-10 DIAGNOSIS — I2693 Single subsegmental pulmonary embolism without acute cor pulmonale: Secondary | ICD-10-CM

## 2023-02-10 DIAGNOSIS — I4821 Permanent atrial fibrillation: Secondary | ICD-10-CM

## 2023-02-10 DIAGNOSIS — I10 Essential (primary) hypertension: Secondary | ICD-10-CM

## 2023-02-10 DIAGNOSIS — R7989 Other specified abnormal findings of blood chemistry: Secondary | ICD-10-CM | POA: Diagnosis not present

## 2023-02-10 DIAGNOSIS — F0390 Unspecified dementia without behavioral disturbance: Secondary | ICD-10-CM

## 2023-02-10 DIAGNOSIS — E782 Mixed hyperlipidemia: Secondary | ICD-10-CM | POA: Insufficient documentation

## 2023-02-10 LAB — CBC
HCT: 29.8 % — ABNORMAL LOW (ref 36.0–46.0)
Hemoglobin: 8.6 g/dL — ABNORMAL LOW (ref 12.0–15.0)
MCH: 23.3 pg — ABNORMAL LOW (ref 26.0–34.0)
MCHC: 28.9 g/dL — ABNORMAL LOW (ref 30.0–36.0)
MCV: 80.8 fL (ref 80.0–100.0)
Platelets: 264 10*3/uL (ref 150–400)
RBC: 3.69 MIL/uL — ABNORMAL LOW (ref 3.87–5.11)
RDW: 20.5 % — ABNORMAL HIGH (ref 11.5–15.5)
WBC: 10.8 10*3/uL — ABNORMAL HIGH (ref 4.0–10.5)
nRBC: 0 % (ref 0.0–0.2)

## 2023-02-10 LAB — HEMOGLOBIN AND HEMATOCRIT, BLOOD
HCT: 26 % — ABNORMAL LOW (ref 36.0–46.0)
Hemoglobin: 7.8 g/dL — ABNORMAL LOW (ref 12.0–15.0)

## 2023-02-10 LAB — COMPREHENSIVE METABOLIC PANEL
ALT: 9 U/L (ref 0–44)
AST: 17 U/L (ref 15–41)
Albumin: 3.7 g/dL (ref 3.5–5.0)
Alkaline Phosphatase: 88 U/L (ref 38–126)
Anion gap: 11 (ref 5–15)
BUN: 10 mg/dL (ref 8–23)
CO2: 27 mmol/L (ref 22–32)
Calcium: 9 mg/dL (ref 8.9–10.3)
Chloride: 99 mmol/L (ref 98–111)
Creatinine, Ser: 0.83 mg/dL (ref 0.44–1.00)
GFR, Estimated: >60 mL/min (ref >60)
Glucose, Bld: 123 mg/dL — ABNORMAL HIGH (ref 70–99)
Potassium: 3.7 mmol/L (ref 3.5–5.1)
Sodium: 137 mmol/L (ref 135–145)
Total Bilirubin: 0.7 mg/dL (ref 0.0–1.2)
Total Protein: 6.5 g/dL (ref 6.5–8.1)

## 2023-02-10 LAB — ECHOCARDIOGRAM COMPLETE
AR max vel: 1.4 cm2
AV Area VTI: 1.56 cm2
AV Area mean vel: 1.39 cm2
AV Mean grad: 5 mm[Hg]
AV Peak grad: 7.7 mm[Hg]
Ao pk vel: 1.39 m/s
Area-P 1/2: 2.81 cm2
Height: 64 in
S' Lateral: 2.2 cm
Weight: 2155.22 [oz_av]

## 2023-02-10 LAB — MAGNESIUM: Magnesium: 2 mg/dL (ref 1.7–2.4)

## 2023-02-10 LAB — HEPARIN LEVEL (UNFRACTIONATED)
Heparin Unfractionated: 0.4 [IU]/mL (ref 0.30–0.70)
Heparin Unfractionated: 0.4 [IU]/mL (ref 0.30–0.70)

## 2023-02-10 LAB — TROPONIN I (HIGH SENSITIVITY)
Troponin I (High Sensitivity): 51 ng/L — ABNORMAL HIGH (ref <18)
Troponin I (High Sensitivity): 46 ng/L — ABNORMAL HIGH (ref <18)

## 2023-02-10 LAB — LIPASE, BLOOD: Lipase: 90 U/L — ABNORMAL HIGH (ref 11–51)

## 2023-02-10 LAB — APTT: aPTT: 104 s — ABNORMAL HIGH (ref 24–36)

## 2023-02-10 LAB — PHOSPHORUS: Phosphorus: 3 mg/dL (ref 2.5–4.6)

## 2023-02-10 MED ORDER — DONEPEZIL HCL 5 MG PO TABS
5.0000 mg | ORAL_TABLET | Freq: Every day | ORAL | Status: DC
  Administered 2023-02-10 – 2023-02-11 (×2): 5 mg via ORAL
  Filled 2023-02-10 (×2): qty 1

## 2023-02-10 MED ORDER — MEMANTINE HCL 10 MG PO TABS
10.0000 mg | ORAL_TABLET | Freq: Every day | ORAL | Status: DC
  Administered 2023-02-10 – 2023-02-12 (×3): 10 mg via ORAL
  Filled 2023-02-10 (×3): qty 1

## 2023-02-10 MED ORDER — ACETAMINOPHEN 650 MG RE SUPP: 650.0000 mg | Freq: Four times a day (QID) | RECTAL | Status: DC | PRN

## 2023-02-10 MED ORDER — ATORVASTATIN CALCIUM 40 MG PO TABS
40.0000 mg | ORAL_TABLET | Freq: Every day | ORAL | Status: DC
  Administered 2023-02-10 – 2023-02-12 (×3): 40 mg via ORAL
  Filled 2023-02-10 (×3): qty 1

## 2023-02-10 MED ORDER — FAMOTIDINE 20 MG PO TABS
40.0000 mg | ORAL_TABLET | Freq: Every day | ORAL | Status: DC
  Administered 2023-02-10 – 2023-02-11 (×2): 40 mg via ORAL
  Filled 2023-02-10 (×2): qty 2

## 2023-02-10 MED ORDER — IPRATROPIUM BROMIDE 0.02 % IN SOLN
RESPIRATORY_TRACT | Status: AC
  Filled 2023-02-10: qty 2.5

## 2023-02-10 MED ORDER — VENLAFAXINE HCL ER 75 MG PO CP24
75.0000 mg | ORAL_CAPSULE | Freq: Every day | ORAL | Status: DC
  Administered 2023-02-11 – 2023-02-12 (×2): 75 mg via ORAL
  Filled 2023-02-10 (×2): qty 1

## 2023-02-10 MED ORDER — METOPROLOL SUCCINATE ER 25 MG PO TB24
12.5000 mg | ORAL_TABLET | Freq: Every day | ORAL | Status: DC
  Administered 2023-02-10 – 2023-02-12 (×3): 12.5 mg via ORAL
  Filled 2023-02-10 (×3): qty 1

## 2023-02-10 MED ORDER — ALPRAZOLAM 0.25 MG PO TABS
0.2500 mg | ORAL_TABLET | Freq: Two times a day (BID) | ORAL | Status: DC | PRN
  Administered 2023-02-11: 0.25 mg via ORAL
  Filled 2023-02-10: qty 1

## 2023-02-10 MED ORDER — ACETAMINOPHEN 325 MG PO TABS
650.0000 mg | ORAL_TABLET | Freq: Four times a day (QID) | ORAL | Status: DC | PRN
  Administered 2023-02-10: 650 mg via ORAL
  Filled 2023-02-10: qty 2

## 2023-02-10 MED ORDER — HEPARIN (PORCINE) 25000 UT/250ML-% IV SOLN
1000.0000 [IU]/h | INTRAVENOUS | Status: DC
Start: 2023-02-10 — End: 2023-02-11
  Administered 2023-02-10 – 2023-02-11 (×2): 1000 [IU]/h via INTRAVENOUS
  Filled 2023-02-10 (×2): qty 250

## 2023-02-10 MED ORDER — PROCHLORPERAZINE EDISYLATE 10 MG/2ML IJ SOLN: 10.0000 mg | Freq: Four times a day (QID) | INTRAMUSCULAR | Status: DC | PRN

## 2023-02-10 MED ORDER — LEVALBUTEROL HCL 0.63 MG/3ML IN NEBU
INHALATION_SOLUTION | RESPIRATORY_TRACT | Status: AC
  Filled 2023-02-10: qty 3

## 2023-02-10 MED ORDER — LOSARTAN POTASSIUM 50 MG PO TABS
25.0000 mg | ORAL_TABLET | Freq: Every day | ORAL | Status: DC
  Administered 2023-02-10 – 2023-02-12 (×3): 25 mg via ORAL
  Filled 2023-02-10 (×3): qty 1

## 2023-02-10 MED ORDER — IOHEXOL 350 MG/ML SOLN
100.0000 mL | Freq: Once | INTRAVENOUS | Status: AC | PRN
  Administered 2023-02-10: 100 mL via INTRAVENOUS

## 2023-02-10 NOTE — Progress Notes (Signed)
Alert, very HOH.  Continues on heparin drip at 10 mls/hr.  Wears pad for vaginal bleeding but just a smear on pad this morning.  No other signs of bleeding   Has not been vomiting but did not eat breakfast.  No SOB and O2 sat 91% on RA.  Swallowed pills with  no difficulty.  Daughter, [name], at bedside.   [place] BLE and echo completed. No DVTs noted

## 2023-02-10 NOTE — ED Notes (Signed)
 Patient transported to CT

## 2023-02-10 NOTE — Progress Notes (Signed)
PROGRESS NOTE    Renee Cameron  GNF:[id number] DOB: Oct 21, 1924 DOA: 02/09/2023 PCP: Irven Baltimore, NP    Brief Narrative:   Renee Cameron is a 89 y.o. female with past medical history significant for permanent atrial fibrillation no longer on anticoagulation, complete heart block s/p PPM, HTN, HLD, anxiety, dementia, hard of hearing, recent diagnosis of uterine carcinosarcoma (managed conservatively/palliatively given her comorbidities) who presented to [facility] ED on 2/1 [facility] due to 1 episode of emesis.  Patient unable to contribute to HPI due to her underlying dementia but denies headache, no dizziness, no chest pain, no shortness of breath, no abdominal pain, no fever, no abdominal pain.  In the ED, temperature 98.8 F, HR 71, RR 18, BP 152/60, SpO2 96% on room air.  WBC 10.8, hemoglobin 8.6, platelet count 264.  Sodium 137, potassium 3.7, chloride 99, CO2 27, glucose 123, BUN 10, creatinine 0.83.  AST 17, ALT 9, total bilirubin 0.7.  High sensitive troponin 51> 46.  CT angiogram chest with nonocclusive segmental pulmonary emboli right middle lobe, left lower lobe with no definite heart strain.  CT abdomen/pelvis with no acute abnormality in the abdomen/pelvis, thickened peripheral enhancing endometrium, aortic atherosclerosis.  Patient was started on IV heparin drip.  LR 500 mL bolus given.  TRH consulted for admission for further evaluation management of pulmonary embolism.  Assessment & Plan:   Acute pulmonary embolism CT angiogram chest with nonocclusive segmental pulmonary emboli right middle lobe, left lower lobe with no definite heart strain.  Patient previously on Xarelto for atrial fibrillation but daughter reports many of her medicines discontinued after diagnosis of endometrial carcinosarcoma with vaginal bleeding. -- TTE: Pending -- Venous duplex ultrasound bilateral lower extremities: Pending -- Continue heparin drip, anticipate transition back to  Xarelto -- Monitor on telemetry  Permanent atrial fibrillation Follows with cardiology outpatient, Dr. Diona Browner.  Previously on Xarelto but discontinued due to diagnosis of endometrial carcinosarcoma with vaginal bleeding. -- Metoprolol succinate 12.5 mg p.o. daily -- Heparin drip as above, anticipate transition back to Xarelto as above -- Monitor on telemetry  Endometrial carcinosarcoma Recently diagnosed, seen by gynecology/oncology, Dr. Pricilla Holm outpatient.  Family opting for conservative/palliative treatment given her advanced age, comorbidities and advanced dementia.  History of complete heart block s/p PPM -- Monitor on telemetry -- Outpatient follow-up with electrophysiology  Essential hypertension -- Metoprolol succinate 12.5 mg p.o. daily -- Losartan 25 mg p.o. daily  Hyperlipidemia -- Atorvastatin 40 mg p.o. daily  Anxiety -- Effexor 75 mg p.o. daily (substituted for home Pristiq) -- Alprazolam 0.25 md PO twice daily as needed anxiety  Dementia -- Delirium precautions -- Get up during the day -- Encourage a familiar face to remain present throughout the day -- Keep blinds open and lights on during daylight hours -- Minimize the use of opioids/benzodiazepines -- Aricept 5 mg p.o. nightly -- Namenda 10 mg p.o. daily -- Melatonin 3 mg p.o. nightly  Hard of hearing --Supportive care  GERD -- Famotidine 40 mg p.o. daily   DVT prophylaxis: SCDs Start: 02/10/23 0736    Code Status: Limited: Do not attempt resuscitation (DNR) -DNR-LIMITED -Do Not Intubate/DNI  Family Communication: Updated daughter present at bedside this morning  Disposition Plan:  Level of care: Med-Surg Status is: Observation The patient remains OBS appropriate and will d/c before 2 midnights.    Consultants:  None  Procedures:  TTE Vascular duplex ultrasound bilateral lower extremities  Antimicrobials:  None   Subjective: Seen examined bedside, resting  calmly.  Lying in bed.   Daughter present.  Patient hard of hearing.  RN present at bedside.  On heparin drip.  Updated patient's daughter at bedside regarding diagnosis of pulmonary embolism, plan.  Daughter reports recently taken off some of her medications due to her cancer diagnosis.  Looks like previously on Xarelto per cardiology's note for A-fib but looks like she was taken off likely for vaginal bleeding from her underlying endometrial carcinosarcoma.  Patient pleasantly confused, otherwise no significant complaints this morning.  Denies headache, no dizziness, no chest pain, no palpitations, no shortness of breath, no abdominal pain, no fever, no weakness.  Objective: Vitals:   02/10/23 0142 02/10/23 0200 02/10/23 0335 02/10/23 0341  BP: (!) 163/63 135/65  (!) 149/61  Pulse: 73 73  72  Resp: 18 19  18   Temp:    98.9 F (37.2 C)  TempSrc:    Oral  SpO2: 95% 95%  93%  Weight:   61.1 kg   Height:   5\' 4"  (1.626 m)    No intake or output data in the 24 hours ending 02/10/23 0915 Filed Weights   02/09/23 2338 02/10/23 0335  Weight: 64.8 kg 61.1 kg    Examination:  Physical Exam: GEN: NAD, alert, pleasantly confused, elderly in appearance, + HOH HEENT: NCAT, PERRL, EOMI, sclera clear, MMM PULM: CTAB w/o wheezes/crackles, normal respiratory effort, on room air CV: RRR w/o M/G/R GI: abd soft, NTND, + BS MSK: no peripheral edema, moves all extremities independently NEURO: No focal neurological deficit PSYCH: normal mood/affect Integumentary: No concerning rashes/lesions/wounds noted on exposed skin surfaces.    Data Reviewed: I have personally reviewed following labs and imaging studies  CBC: Recent Labs  Lab 02/09/23 2349  WBC 10.8*  HGB 8.6*  HCT 29.8*  MCV 80.8  PLT 264   Basic Metabolic Panel: Recent Labs  Lab 02/09/23 2349 02/10/23 0755  NA 137  --   K 3.7  --   CL 99  --   CO2 27  --   GLUCOSE 123*  --   BUN 10  --   CREATININE 0.83  --   CALCIUM 9.0  --   MG 2.0  --   PHOS   --  3.0   GFR: Estimated Creatinine Clearance: 34.2 mL/min (by C-G formula based on SCr of 0.83 mg/dL). Liver Function Tests: Recent Labs  Lab 02/09/23 2349  AST 17  ALT 9  ALKPHOS 88  BILITOT 0.7  PROT 6.5  ALBUMIN 3.7   Recent Labs  Lab 02/09/23 2349  LIPASE 90*   No results for input(s): "AMMONIA" in the last 168 hours. Coagulation Profile: No results for input(s): "INR", "PROTIME" in the last 168 hours. Cardiac Enzymes: No results for input(s): "CKTOTAL", "CKMB", "CKMBINDEX", "TROPONINI" in the last 168 hours. BNP (last 3 results) No results for input(s): "PROBNP" in the last 8760 hours. HbA1C: No results for input(s): "HGBA1C" in the last 72 hours. CBG: No results for input(s): "GLUCAP" in the last 168 hours. Lipid Profile: No results for input(s): "CHOL", "HDL", "LDLCALC", "TRIG", "CHOLHDL", "LDLDIRECT" in the last 72 hours. Thyroid Function Tests: No results for input(s): "TSH", "T4TOTAL", "FREET4", "T3FREE", "THYROIDAB" in the last 72 hours. Anemia Panel: No results for input(s): "VITAMINB12", "FOLATE", "FERRITIN", "TIBC", "IRON", "RETICCTPCT" in the last 72 hours. Sepsis Labs: No results for input(s): "PROCALCITON", "LATICACIDVEN" in the last 168 hours.  No results found for this or any previous visit (from the past 240 hours).  Radiology Studies: CT Angio Chest PE W and/or Wo Contrast Result Date: 02/10/2023 CLINICAL DATA:  89 year old female with emesis and abdominal pain. Patient has dementia and is unable to elaborate on symptoms. PE suspected. EXAM: CT ANGIOGRAPHY CHEST CT ABDOMEN AND PELVIS WITH CONTRAST TECHNIQUE: Multidetector CT imaging of the chest was performed using the standard protocol during bolus administration of intravenous contrast. Multiplanar CT image reconstructions and MIPs were obtained to evaluate the vascular anatomy. Multidetector CT imaging of the abdomen and pelvis was performed using the standard protocol during bolus  administration of intravenous contrast. RADIATION DOSE REDUCTION: This exam was performed according to the departmental dose-optimization program which includes automated exposure control, adjustment of the mA and/or kV according to patient size and/or use of iterative reconstruction technique. CONTRAST:  OMNIPAQUE IOHEXOL 350 MG/ML SOLN COMPARISON:  CT chest 09/28/2017 and CT abdomen and pelvis 03/14/2014 FINDINGS: CTA CHEST FINDINGS Cardiovascular: Nonocclusive segmental pulmonary emboli in a right middle lobe pulmonary artery (series 3/image 161) and left lower lobe pulmonary artery (series 3/image 161). No definite right heart strain though evaluation is limited by marked cardiomegaly. Trace pericardial effusion. Coronary artery and aortic atherosclerotic calcification. Left chest wall ICD. Mediastinum/Nodes: Trachea and esophagus are unremarkable. No thoracic adenopathy. Lungs/Pleura: Scarring in the lung bases. No focal pneumonia, pleural effusion, or pneumothorax. Musculoskeletal: No acute fracture. Review of the MIP images confirms the above findings. CT ABDOMEN and PELVIS FINDINGS Hepatobiliary: No acute abnormality. Pancreas: Unremarkable. Spleen: Unremarkable. Adrenals/Urinary Tract: Normal adrenal glands. No urinary calculi or hydronephrosis. Unremarkable bladder. Stomach/Bowel: Stomach is within normal limits. No bowel wall thickening or bowel obstruction. The appendix is not definitively seen. No secondary signs of appendicitis. Colonic diverticulosis without diverticulitis. Vascular/Lymphatic: Aortic atherosclerosis. No enlarged abdominal or pelvic lymph nodes. Reproductive: Thickened peripherally enhancing endometrium. No adnexal mass. Other: No free intraperitoneal fluid or air. Musculoskeletal: Demineralization.  No acute fracture. Review of the MIP images confirms the above findings. IMPRESSION: 1. Nonocclusive segmental pulmonary emboli in the right middle lobe and left lower lobe pulmonary  arteries. No definite right heart strain though evaluation is limited by marked cardiomegaly. 2. No acute abnormality in the abdomen or pelvis. 3. Thickened peripherally enhancing endometrium. If clinically appropriate given comorbidities and life expectancy, consider nonemergent pelvic ultrasound for further evaluation. 4.  Aortic Atherosclerosis (ICD10-I70.0). Critical Value/emergent results were called by telephone at the time of interpretation on 02/10/2023 at 1:29 am to provider [facility] , who verbally acknowledged these results. Electronically Signed   By: Minerva Fester M.D.   On: 02/10/2023 01:30   CT ABDOMEN PELVIS W CONTRAST Result Date: 02/10/2023 CLINICAL DATA:  89 year old female with emesis and abdominal pain. Patient has dementia and is unable to elaborate on symptoms. PE suspected. EXAM: CT ANGIOGRAPHY CHEST CT ABDOMEN AND PELVIS WITH CONTRAST TECHNIQUE: Multidetector CT imaging of the chest was performed using the standard protocol during bolus administration of intravenous contrast. Multiplanar CT image reconstructions and MIPs were obtained to evaluate the vascular anatomy. Multidetector CT imaging of the abdomen and pelvis was performed using the standard protocol during bolus administration of intravenous contrast. RADIATION DOSE REDUCTION: This exam was performed according to the departmental dose-optimization program which includes automated exposure control, adjustment of the mA and/or kV according to patient size and/or use of iterative reconstruction technique. CONTRAST:  OMNIPAQUE IOHEXOL 350 MG/ML SOLN COMPARISON:  CT chest 09/28/2017 and CT abdomen and pelvis 03/14/2014 FINDINGS: CTA CHEST FINDINGS Cardiovascular: Nonocclusive segmental pulmonary emboli in a right middle lobe pulmonary artery (  series 3/image 161) and left lower lobe pulmonary artery (series 3/image 161). No definite right heart strain though evaluation is limited by marked cardiomegaly. Trace pericardial  effusion. Coronary artery and aortic atherosclerotic calcification. Left chest wall ICD. Mediastinum/Nodes: Trachea and esophagus are unremarkable. No thoracic adenopathy. Lungs/Pleura: Scarring in the lung bases. No focal pneumonia, pleural effusion, or pneumothorax. Musculoskeletal: No acute fracture. Review of the MIP images confirms the above findings. CT ABDOMEN and PELVIS FINDINGS Hepatobiliary: No acute abnormality. Pancreas: Unremarkable. Spleen: Unremarkable. Adrenals/Urinary Tract: Normal adrenal glands. No urinary calculi or hydronephrosis. Unremarkable bladder. Stomach/Bowel: Stomach is within normal limits. No bowel wall thickening or bowel obstruction. The appendix is not definitively seen. No secondary signs of appendicitis. Colonic diverticulosis without diverticulitis. Vascular/Lymphatic: Aortic atherosclerosis. No enlarged abdominal or pelvic lymph nodes. Reproductive: Thickened peripherally enhancing endometrium. No adnexal mass. Other: No free intraperitoneal fluid or air. Musculoskeletal: Demineralization.  No acute fracture. Review of the MIP images confirms the above findings. IMPRESSION: 1. Nonocclusive segmental pulmonary emboli in the right middle lobe and left lower lobe pulmonary arteries. No definite right heart strain though evaluation is limited by marked cardiomegaly. 2. No acute abnormality in the abdomen or pelvis. 3. Thickened peripherally enhancing endometrium. If clinically appropriate given comorbidities and life expectancy, consider nonemergent pelvic ultrasound for further evaluation. 4.  Aortic Atherosclerosis (ICD10-I70.0). Critical Value/emergent results were called by telephone at the time of interpretation on 02/10/2023 at 1:29 am to provider [facility] , who verbally acknowledged these results. Electronically Signed   By: Minerva Fester M.D.   On: 02/10/2023 01:30        Scheduled Meds:  ipratropium       levalbuterol       Continuous Infusions:  heparin 1,000  Units/hr (02/10/23 0237)     LOS: 0 days    Time spent: 52 minutes spent on chart review, discussion with nursing staff, consultants, updating family and interview/physical exam; more than 50% of that time was spent in counseling and/or coordination of care.    Alvira Philips [place], DO [facility] Available via Epic secure chat 7am-7pm After these hours, please refer to coverage provider listed on [email or web address] 02/10/2023, 9:15 AM

## 2023-02-10 NOTE — H&P (Signed)
History and Physical    Patient: Renee Cameron DOB: 1924-04-10 DOA: 02/09/2023 DOS: the patient was seen and examined on 02/10/2023 PCP: Irven Baltimore, NP  Patient coming from: SNF  Chief Complaint:  Chief Complaint  Patient presents with   Emesis   HPI: Renee Cameron is a 89 y.o. female with medical history significant of permanent atrial fibrillation, hypertension, dementia, heart block status post pacemaker placement, endometrial cancer with conservative management and palliative care.  Patient was unable to provide history possibly due to dementia and daughter at bedside states that she was only told that patient was being taken to the ED due to vomiting.  Rest of history was obtained from ED physician and ED medical record per report, EMS reported that patient  vomited once about 1 hour prior to arrival to the ED.  ED Course:  In the emergency department, BP was 147/62, other vital signs are within normal range.  Workup in the ED showed normocytic anemia BMP was normal except for blood glucose of 123.  Lipase 90, troponin 51 > 46. CT angiography of chest showed nonocclusive segmental pulmonary emboli in the right middle lobe and left lower lobe pulmonary arteries.  No definite right heart strain though evaluation is limited by marked cardiomegaly. CT abdomen and pelvis showed no acute abnormality in the abdomen and pelvis Patient was started on IV heparin drip.  IV LR 500 mL was given. Hospitalist was asked to admit patient for further evaluation and management.  Review of Systems: Review of systems as noted in the HPI. All other systems reviewed and are negative.   Past Medical History:  Diagnosis Date   Anxiety    Atrial fibrillation (HCC)    Chronic anticoagulation    Complete heart block (HCC)    a. s/p STJ PPM 05/2017.   Dementia [facility])    Depression    Essential hypertension    Hyperthyroidism 2000   2000   Macular degeneration    Osteoporosis     Presence of permanent cardiac pacemaker    Past Surgical History:  Procedure Laterality Date   APPENDECTOMY     CATARACT EXTRACTION W/PHACO Right 01/12/2022   Procedure: CATARACT EXTRACTION PHACO AND INTRAOCULAR LENS PLACEMENT (IOC);  Surgeon: [name], MD;  Location: [facility];  Service: Ophthalmology;  Laterality: Right;  CDE: 15.73   CATARACT EXTRACTION W/PHACO Left 08/20/2022   Procedure: CATARACT EXTRACTION PHACO AND INTRAOCULAR LENS PLACEMENT (IOC);  Surgeon: [name], MD;  Location: [facility];  Service: Ophthalmology;  Laterality: Left;  CDE 9.78   COLONOSCOPY  2005   Dr. Karilyn Cota: few small diverticula. external hemorrhoids   PACEMAKER IMPLANT N/A 05/31/2017   Procedure: PACEMAKER IMPLANT;  Surgeon: Marinus Maw, MD;  Location: [facility] INVASIVE CV LAB;  Service: Cardiovascular;  Laterality: N/A;    Social History:  reports that she has never smoked. She has never used smokeless tobacco. She reports that she does not drink alcohol and does not use drugs.   No Known Allergies  Family History  Problem Relation Age of Onset   Prostate cancer Father    Stroke Father    Heart disease Mother    Coronary artery disease Sister        with prior CABG surgery     Prior to Admission medications   Medication Sig Start Date End Date Taking? Authorizing Provider  acetaminophen (TYLENOL) 325 MG tablet Take 2 tablets (650 mg total) by mouth every 6 (six) hours as needed. 03/07/22  Cristopher Peru, PA-C  ALPRAZolam Prudy Feeler) 0.25 MG tablet Take 0.25 mg by mouth 2 (two) times daily as needed. 10/02/19   [provider]  atorvastatin (LIPITOR) 40 MG tablet Take 40 mg by mouth daily.    [provider]  Cholecalciferol 50 MCG (2000 UT) CAPS Take 1 capsule by mouth daily.    [provider]  cyanocobalamin (VITAMIN B12) 500 MCG tablet Take 500 mcg by mouth every other day. Take 1 tablet by mouth every Monday Wednesday and Friday    [provider]   desvenlafaxine (PRISTIQ) 50 MG 24 hr tablet Take 1 tablet by mouth daily. 05/18/17   [provider]  donepezil (ARICEPT) 5 MG tablet Take 5 mg by mouth at bedtime. 04/09/17   [provider]  famotidine (PEPCID) 40 MG tablet Take 40 mg by mouth daily.    [provider]  furosemide (LASIX) 20 MG tablet Take 20 mg by mouth daily.    [provider]  losartan (COZAAR) 25 MG tablet Take 25 mg by mouth daily. 07/21/20   [provider]  medroxyPROGESTERone (PROVERA) 10 MG tablet Take 1 tablet (10 mg total) by mouth daily. 09/17/22   Ozan, Jennifer, DO  MEGARED OMEGA-3 KRILL OIL 500 MG CAPS Take 500 mg by mouth daily at 12 noon.    [provider]  memantine (NAMENDA) 10 MG tablet Take 1 tablet by mouth daily. 04/20/17   [provider]  metoprolol succinate (TOPROL-XL) 12.5 mg TB24 24 hr tablet Take 12.5 mg by mouth daily.    [provider]  Multiple Vitamins-Minerals (PRESERVISION AREDS 2+MULTI VIT) CAPS Take 2 capsules by mouth daily.    [provider]  Polyvinyl Alcohol-Povidone (REFRESH OP) Apply 1 drop to eye in the morning, at noon, and at bedtime. 1 drop in both eyes 3 times a day    [provider]  potassium chloride (K-DUR,KLOR-CON) 10 MEQ tablet Take 1 tablet by mouth daily. 09/07/17   [provider]    Physical Exam: BP (!) 149/61 (BP Location: Left Arm)   Pulse 72   Temp 98.9 F (37.2 C) (Oral)   Resp 18   Ht 5\' 4"  (1.626 m)   Wt 61.1 kg   SpO2 93%   BMI 23.12 kg/m   General: 89 y.o. year-old female well developed well nourished in no acute distress.  Alert and oriented x3. HEENT: NCAT, EOMI Neck: Supple, trachea medial Cardiovascular: Regular rate and rhythm with no rubs or gallops.  No thyromegaly or JVD noted.  No lower extremity edema. 2/4 pulses in all 4 extremities. Respiratory: Clear to auscultation with no wheezes or rales. Good inspiratory effort. Abdomen: Soft, nontender  nondistended with normal bowel sounds x4 quadrants. Muskuloskeletal: No cyanosis, clubbing or edema noted bilaterally Neuro: CN II-XII intact, strength 5/5 x 4, sensation, reflexes intact Skin: No ulcerative lesions noted or rashes Psychiatry: Judgement and insight appear normal. Mood is appropriate for condition and setting          Labs on Admission:  Basic Metabolic Panel: Recent Labs  Lab 02/09/23 2349  NA 137  K 3.7  CL 99  CO2 27  GLUCOSE 123*  BUN 10  CREATININE 0.83  CALCIUM 9.0  MG 2.0   Liver Function Tests: Recent Labs  Lab 02/09/23 2349  AST 17  ALT 9  ALKPHOS 88  BILITOT 0.7  PROT 6.5  ALBUMIN 3.7   Recent Labs  Lab 02/09/23 2349  LIPASE 90*  No results for input(s): "AMMONIA" in the last 168 hours. CBC: Recent Labs  Lab 02/09/23 2349  WBC 10.8*  HGB 8.6*  HCT 29.8*  MCV 80.8  PLT 264   Cardiac Enzymes: No results for input(s): "CKTOTAL", "CKMB", "CKMBINDEX", "TROPONINI" in the last 168 hours.  BNP (last 3 results) No results for input(s): "BNP" in the last 8760 hours.  ProBNP (last 3 results) No results for input(s): "PROBNP" in the last 8760 hours.  CBG: No results for input(s): "GLUCAP" in the last 168 hours.  Radiological Exams on Admission: CT Angio Chest PE W and/or Wo Contrast Result Date: 02/10/2023 CLINICAL DATA:  89 year old female with emesis and abdominal pain. Patient has dementia and is unable to elaborate on symptoms. PE suspected. EXAM: CT ANGIOGRAPHY CHEST CT ABDOMEN AND PELVIS WITH CONTRAST TECHNIQUE: Multidetector CT imaging of the chest was performed using the standard protocol during bolus administration of intravenous contrast. Multiplanar CT image reconstructions and MIPs were obtained to evaluate the vascular anatomy. Multidetector CT imaging of the abdomen and pelvis was performed using the standard protocol during bolus administration of intravenous contrast. RADIATION DOSE REDUCTION: This exam was performed  according to the departmental dose-optimization program which includes automated exposure control, adjustment of the mA and/or kV according to patient size and/or use of iterative reconstruction technique. CONTRAST:  OMNIPAQUE IOHEXOL 350 MG/ML SOLN COMPARISON:  CT chest 09/28/2017 and CT abdomen and pelvis 03/14/2014 FINDINGS: CTA CHEST FINDINGS Cardiovascular: Nonocclusive segmental pulmonary emboli in a right middle lobe pulmonary artery (series 3/image 161) and left lower lobe pulmonary artery (series 3/image 161). No definite right heart strain though evaluation is limited by marked cardiomegaly. Trace pericardial effusion. Coronary artery and aortic atherosclerotic calcification. Left chest wall ICD. Mediastinum/Nodes: Trachea and esophagus are unremarkable. No thoracic adenopathy. Lungs/Pleura: Scarring in the lung bases. No focal pneumonia, pleural effusion, or pneumothorax. Musculoskeletal: No acute fracture. Review of the MIP images confirms the above findings. CT ABDOMEN and PELVIS FINDINGS Hepatobiliary: No acute abnormality. Pancreas: Unremarkable. Spleen: Unremarkable. Adrenals/Urinary Tract: Normal adrenal glands. No urinary calculi or hydronephrosis. Unremarkable bladder. Stomach/Bowel: Stomach is within normal limits. No bowel wall thickening or bowel obstruction. The appendix is not definitively seen. No secondary signs of appendicitis. Colonic diverticulosis without diverticulitis. Vascular/Lymphatic: Aortic atherosclerosis. No enlarged abdominal or pelvic lymph nodes. Reproductive: Thickened peripherally enhancing endometrium. No adnexal mass. Other: No free intraperitoneal fluid or air. Musculoskeletal: Demineralization.  No acute fracture. Review of the MIP images confirms the above findings. IMPRESSION: 1. Nonocclusive segmental pulmonary emboli in the right middle lobe and left lower lobe pulmonary arteries. No definite right heart strain though evaluation is limited by marked  cardiomegaly. 2. No acute abnormality in the abdomen or pelvis. 3. Thickened peripherally enhancing endometrium. If clinically appropriate given comorbidities and life expectancy, consider nonemergent pelvic ultrasound for further evaluation. 4.  Aortic Atherosclerosis (ICD10-I70.0). Critical Value/emergent results were called by telephone at the time of interpretation on 02/10/2023 at 1:29 am to provider [facility] , who verbally acknowledged these results. Electronically Signed   By: Minerva Fester M.D.   On: 02/10/2023 01:30   CT ABDOMEN PELVIS W CONTRAST Result Date: 02/10/2023 CLINICAL DATA:  89 year old female with emesis and abdominal pain. Patient has dementia and is unable to elaborate on symptoms. PE suspected. EXAM: CT ANGIOGRAPHY CHEST CT ABDOMEN AND PELVIS WITH CONTRAST TECHNIQUE: Multidetector CT imaging of the chest was performed using the standard protocol during bolus administration of intravenous contrast. Multiplanar CT image reconstructions and MIPs were obtained  to evaluate the vascular anatomy. Multidetector CT imaging of the abdomen and pelvis was performed using the standard protocol during bolus administration of intravenous contrast. RADIATION DOSE REDUCTION: This exam was performed according to the departmental dose-optimization program which includes automated exposure control, adjustment of the mA and/or kV according to patient size and/or use of iterative reconstruction technique. CONTRAST:  OMNIPAQUE IOHEXOL 350 MG/ML SOLN COMPARISON:  CT chest 09/28/2017 and CT abdomen and pelvis 03/14/2014 FINDINGS: CTA CHEST FINDINGS Cardiovascular: Nonocclusive segmental pulmonary emboli in a right middle lobe pulmonary artery (series 3/image 161) and left lower lobe pulmonary artery (series 3/image 161). No definite right heart strain though evaluation is limited by marked cardiomegaly. Trace pericardial effusion. Coronary artery and aortic atherosclerotic calcification. Left chest wall  ICD. Mediastinum/Nodes: Trachea and esophagus are unremarkable. No thoracic adenopathy. Lungs/Pleura: Scarring in the lung bases. No focal pneumonia, pleural effusion, or pneumothorax. Musculoskeletal: No acute fracture. Review of the MIP images confirms the above findings. CT ABDOMEN and PELVIS FINDINGS Hepatobiliary: No acute abnormality. Pancreas: Unremarkable. Spleen: Unremarkable. Adrenals/Urinary Tract: Normal adrenal glands. No urinary calculi or hydronephrosis. Unremarkable bladder. Stomach/Bowel: Stomach is within normal limits. No bowel wall thickening or bowel obstruction. The appendix is not definitively seen. No secondary signs of appendicitis. Colonic diverticulosis without diverticulitis. Vascular/Lymphatic: Aortic atherosclerosis. No enlarged abdominal or pelvic lymph nodes. Reproductive: Thickened peripherally enhancing endometrium. No adnexal mass. Other: No free intraperitoneal fluid or air. Musculoskeletal: Demineralization.  No acute fracture. Review of the MIP images confirms the above findings. IMPRESSION: 1. Nonocclusive segmental pulmonary emboli in the right middle lobe and left lower lobe pulmonary arteries. No definite right heart strain though evaluation is limited by marked cardiomegaly. 2. No acute abnormality in the abdomen or pelvis. 3. Thickened peripherally enhancing endometrium. If clinically appropriate given comorbidities and life expectancy, consider nonemergent pelvic ultrasound for further evaluation. 4.  Aortic Atherosclerosis (ICD10-I70.0). Critical Value/emergent results were called by telephone at the time of interpretation on 02/10/2023 at 1:29 am to provider [facility] , who verbally acknowledged these results. Electronically Signed   By: Minerva Fester M.D.   On: 02/10/2023 01:30    EKG: I independently viewed the EKG done and my findings are as followed: Paced rhythm at a rate of 71 bpm  Assessment/Plan Present on Admission:  Pulmonary embolism (HCC)  Vomiting  in adult  Elevated lipase  Elevated troponin  Essential hypertension  Permanent atrial fibrillation (HCC)  Dementia without behavioral disturbance (HCC)  Principal Problem:   Pulmonary embolism (HCC) Active Problems:   Permanent atrial fibrillation (HCC)   Essential hypertension   Vomiting in adult   Elevated troponin   Elevated lipase   Dementia without behavioral disturbance (HCC)   Mixed hyperlipidemia   GERD (gastroesophageal reflux disease)  Pulmonary embolism CT angiography chest with contrast showed nonocclusive segmental pulmonary emboli in the right middle lobe and left lower lobe pulmonary arteries. Patient was started on IV heparin drip with plan to transition to DOAC in the morning Bilateral lower extremity ultrasound will be done in the morning Echocardiogram will be done in the morning  Vomiting Continue Zofran as needed  Elevated lipase Lipase 90, Patient denies any abdominal pain. CT abdomen and pelvis showed unremarkable pancreas Continue to monitor lipase level  Elevated troponin possibly secondary to type II demand ischemia Troponin 51 > 46; troponin already floated, patient denies any chest pain  Mixed hyperlipidemia Continue Lipitor  Essential hypertension Continue losartan  GERD Continue Pepcid  Permanent atrial fibrillation Continue Toprol-XL continue  heparin drip  Dementia Continue Aricept and Namenda   DVT prophylaxis: Heparin drip  Family Communication: Daughter at bedside (all questions answered to satisfaction)   Advance Care Planning:   Code Status: Limited: Do not attempt resuscitation (DNR) -DNR-LIMITED -Do Not Intubate/DNI    Consults: None  Severity of Illness: The appropriate patient status for this patient is OBSERVATION. Observation status is judged to be reasonable and necessary in order to provide the required intensity of service to ensure the patient's safety. The patient's presenting symptoms, physical exam  findings, and initial radiographic and laboratory data in the context of their medical condition is felt to place them at decreased risk for further clinical deterioration. Furthermore, it is anticipated that the patient will be medically stable for discharge from the hospital within 2 midnights of admission.   Author: Frankey Shown, DO 02/10/2023 7:53 AM  For on call review [email or web address].

## 2023-02-10 NOTE — Plan of Care (Signed)
   Problem: Education: Goal: Knowledge of General Education information will improve Description Including pain rating scale, medication(s)/side effects and non-pharmacologic comfort measures Outcome: Progressing   Problem: Activity: Goal: Risk for activity intolerance will decrease Outcome: Progressing   Problem: Safety: Goal: Ability to remain free from injury will improve Outcome: Progressing

## 2023-02-10 NOTE — TOC Initial Note (Signed)
Transition of Care [facility]) - Initial/Assessment Note    Patient Details  Name: Renee Cameron MRN: [id number] Date of Birth: 12/24/1924  Transition of Care [facility]) CM/SW Contact:    Karn Cassis, LCSW Phone Number: 02/10/2023, 3:17 PM  Clinical Narrative:  Pt admitted due to pulmonary embolism. Assessment completed with daughter, [name]. [name] reports pt has been a resident at [facility] since 2019. She ambulates with a walker. [name] is not aware of any home health services currently. She plans on pt returning to [facility] when medically stable. LCSW attempted to reach [place] at facility, but no answer. Will follow up in AM.                  Expected Discharge Plan: Assisted Living Barriers to Discharge: Continued Medical Work up   Patient Goals and CMS Choice Patient states their goals for this hospitalization and ongoing recovery are:: return to ALF   Choice offered to / list presented to : Adult Children [place] ownership interest in [facility].provided to::  (n/a)    Expected Discharge Plan and Services In-house Referral: Clinical Social Work   Post Acute Care Choice: Resumption of Svcs/PTA Provider Living arrangements for the past 2 months: Assisted Living Facility                                      Prior Living Arrangements/Services Living arrangements for the past 2 months: Assisted Living Facility Lives with:: Facility Resident Patient language and need for interpreter reviewed:: Yes Do you feel safe going back to the place where you live?: Yes      Need for Family Participation in Patient Care: Yes (Comment) Care giver support system in place?: Yes (comment) Current home services: DME (walker) Criminal Activity/Legal Involvement Pertinent to Current Situation/Hospitalization: No - Comment as needed  Activities of Daily Living   ADL Screening (condition at time of admission) Independently performs ADLs?: Yes (appropriate  for developmental age) Is the patient deaf or have difficulty hearing?: Yes Does the patient have difficulty seeing, even when wearing glasses/contacts?: No Does the patient have difficulty concentrating, remembering, or making decisions?: No  Permission Sought/Granted         Permission granted to share info w AGENCY: [facility]  Permission granted to share info w Relationship: ALF     Emotional Assessment   Attitude/Demeanor/Rapport: Unable to Assess Affect (typically observed): Unable to Assess Orientation: : Oriented to Self, Oriented to Place Alcohol / Substance Use: Not Applicable Psych Involvement: No (comment)  Admission diagnosis:  Pulmonary embolism (HCC) [I26.99] Pulmonary embolism, unspecified chronicity, unspecified pulmonary embolism type, unspecified whether acute cor pulmonale present (HCC) [I26.99] Patient Active Problem List   Diagnosis Date Noted   Pulmonary embolism (HCC) 02/10/2023   Mixed hyperlipidemia 02/10/2023   GERD (gastroesophageal reflux disease) 02/10/2023   Malignant neoplasm of endometrium (HCC) 10/16/2022   Abnormal thyroid function test 06/01/2017   Complete heart block (HCC) 05/29/2017   Elevated troponin 04/26/2016   Elevated lipase 04/26/2016   Dementia without behavioral disturbance (HCC) 04/26/2016   Abdominal pain in female    Diarrhea    Rectus sheath hematoma    Acute blood loss anemia 03/14/2014   Bruise, trunk 03/14/2014   Vomiting in adult 03/11/2014   CAP (community acquired pneumonia) 03/11/2014   Hyponatremia 03/11/2014   Nausea    Encounter for therapeutic drug monitoring 02/05/2013   Permanent  atrial fibrillation (HCC)    Hyperthyroidism    Essential hypertension    Chronic anticoagulation    PCP:  Irven Baltimore, NP Pharmacy:   [facility] - [place], [place] - [phone] [address] [place] [address] Phone: [phone] Fax: [phone]     Social  Drivers of Health (SDOH) Social History: SDOH Screenings   Food Insecurity: No Food Insecurity (02/10/2023)  Housing: Unknown (02/10/2023)  Transportation Needs: No Transportation Needs (02/10/2023)  Utilities: Not At Risk (02/10/2023)  Depression (PHQ2-9): Low Risk  (10/10/2022)  Social Connections: Moderately Integrated (02/10/2023)  Tobacco Use: Low Risk  (02/09/2023)   SDOH Interventions:     Readmission Risk Interventions     No data to display

## 2023-02-10 NOTE — Progress Notes (Addendum)
PHARMACY - ANTICOAGULATION CONSULT NOTE  Pharmacy Consult for heparin Indication: pulmonary embolus  No Known Allergies  Patient Measurements: Height: 5\' 4"  (162.6 cm) Weight: 61.1 kg (134 lb 11.2 oz) IBW/kg (Calculated) : 54.7  Vital Signs: Temp: 99 F (37.2 C) (02/02 1036) Temp Source: Oral (02/02 1036) BP: 123/51 (02/02 1036) Pulse Rate: 72 (02/02 1036)  Labs: Recent Labs    02/09/23 2349 02/10/23 0133 02/10/23 1052  HGB 8.6*  --   --   HCT 29.8*  --   --   PLT 264  --   --   APTT  --   --  104*  HEPARINUNFRC  --   --  0.40  CREATININE 0.83  --   --   TROPONINIHS 51* 46*  --     Estimated Creatinine Clearance: 34.2 mL/min (by C-G formula based on SCr of 0.83 mg/dL).   Medical History: Past Medical History:  Diagnosis Date   Anxiety    Atrial fibrillation (HCC)    Chronic anticoagulation    Complete heart block (HCC)    a. s/p STJ PPM 05/2017.   Dementia [facility])    Depression    Essential hypertension    Hyperthyroidism 2000   2000   Macular degeneration    Osteoporosis    Presence of permanent cardiac pacemaker     Assessment: 89yo female presented to ED from SNF for one episode of emesis, CT reveals nonocclusive PE, to begin heparin. Patient previously on Xarelto for atrial fibrillation but daughter reports many of her medicines discontinued after diagnosis of endometrial carcinosarcoma with vaginal bleeding. Not on med rec from NH  HL 0.4 is therapeutic. No issues with infusion APTT 104, will use HL for monitoring Plan to transition to PO DOAC once ECHO done  Goal of Therapy:  Heparin level 0.3-0.7 units/ml aPTT 66-102 seconds Monitor platelets by anticoagulation protocol: Yes   Plan:  Continue Heparin at 1000 units/hr. Monitor heparin levels, in 8 hours and Daily  CBC daily  F/U transition to PO DOAC  Elder Cyphers, BS Pharm D, BCPS Clinical Pharmacist 02/10/2023,12:04 PM

## 2023-02-10 NOTE — Care Management Obs Status (Signed)
MEDICARE OBSERVATION STATUS NOTIFICATION   Patient Details  Name: Renee Cameron MRN: [id number] Date of Birth: 05/25/1924   Medicare Observation Status Notification Given:  Yes  I, Karn Cassis, LCSW, verbally reviewed observation notice, signature provided by:     Donna-daughter due to pts's diagnosis of dementia.                                               02/10/2023, 3:07 PM      Karn Cassis, LCSW 02/10/2023, 3:07 PM

## 2023-02-10 NOTE — Progress Notes (Signed)
PHARMACY - ANTICOAGULATION CONSULT NOTE  Pharmacy Consult for heparin Indication: pulmonary embolus  No Known Allergies  Patient Measurements: Height: 5\' 4"  (162.6 cm) Weight: 64.8 kg (142 lb 14.4 oz) IBW/kg (Calculated) : 54.7  Vital Signs: Temp: 98.8 F (37.1 C) (02/01 2337) Temp Source: Oral (02/01 2337) BP: 163/63 (02/02 0142) Pulse Rate: 73 (02/02 0142)  Labs: Recent Labs    02/09/23 2349 02/10/23 0133  HGB 8.6*  --   HCT 29.8*  --   PLT 264  --   CREATININE 0.83  --   TROPONINIHS 51* 46*    Estimated Creatinine Clearance: 34.2 mL/min (by C-G formula based on SCr of 0.83 mg/dL).   Medical History: Past Medical History:  Diagnosis Date   Anxiety    Atrial fibrillation (HCC)    Chronic anticoagulation    Complete heart block (HCC)    a. s/p STJ PPM 05/2017.   Dementia [facility])    Depression    Essential hypertension    Hyperthyroidism 2000   2000   Macular degeneration    Osteoporosis    Presence of permanent cardiac pacemaker     Assessment: 89yo female presented to ED from SNF for one episode of emesis, CT reveals nonocclusive PE, to begin heparin; of note pt is on Xarelto for Afib.  Goal of Therapy:  Heparin level 0.3-0.7 units/ml aPTT 66-102 seconds Monitor platelets by anticoagulation protocol: Yes   Plan:  Begin heparin without bolus at 1000 units/hr. Monitor heparin levels, aPTT (while DOAC affects anti-Xa), and CBC.  Vernard Gambles, PharmD, BCPS  02/10/2023,2:17 AM

## 2023-02-10 NOTE — Progress Notes (Signed)
PHARMACY - ANTICOAGULATION CONSULT NOTE  Pharmacy Consult for heparin Indication: pulmonary embolus  No Known Allergies  Patient Measurements: Height: 5\' 4"  (162.6 cm) Weight: 61.1 kg (134 lb 11.2 oz) IBW/kg (Calculated) : 54.7  Vital Signs: Temp: 100.1 F (37.8 C) (02/02 1430) Temp Source: Oral (02/02 1430) BP: 118/53 (02/02 1430) Pulse Rate: 69 (02/02 1430)  Labs: Recent Labs    02/09/23 2349 02/10/23 0133 02/10/23 1052 02/10/23 1706 02/10/23 1814  HGB 8.6*  --   --  7.8*  --   HCT 29.8*  --   --  26.0*  --   PLT 264  --   --   --   --   APTT  --   --  104*  --   --   HEPARINUNFRC  --   --  0.40  --  0.40  CREATININE 0.83  --   --   --   --   TROPONINIHS 51* 46*  --   --   --     Estimated Creatinine Clearance: 34.2 mL/min (by C-G formula based on SCr of 0.83 mg/dL).   Medical History: Past Medical History:  Diagnosis Date   Anxiety    Atrial fibrillation (HCC)    Chronic anticoagulation    Complete heart block (HCC)    a. s/p STJ PPM 05/2017.   Dementia [facility])    Depression    Essential hypertension    Hyperthyroidism 2000   2000   Macular degeneration    Osteoporosis    Presence of permanent cardiac pacemaker     Assessment: 89yo female presented to ED from SNF for one episode of emesis, CT reveals nonocclusive PE, to begin heparin. Patient previously on Xarelto for atrial fibrillation but daughter reports many of her medicines discontinued after diagnosis of endometrial carcinosarcoma with vaginal bleeding. Not on med rec from [facility]  HL 0.4 is therapeutic x 2. No issues with infusion APTT 104, will use HL for monitoring Plan to transition to PO DOAC once ECHO done  Goal of Therapy:  Heparin level 0.3-0.7 units/ml aPTT 66-102 seconds Monitor platelets by anticoagulation protocol: Yes   Plan:  Continue Heparin at 1000 units/hr. Can transition to monitoring heparin levels daily, Monitor CBC daily  F/U transition to PO DOAC  Paulita Fujita,  PharmD Clinical Pharmacist 02/10/2023,6:49 PM

## 2023-02-11 DIAGNOSIS — Z79899 Other long term (current) drug therapy: Secondary | ICD-10-CM | POA: Diagnosis not present

## 2023-02-11 DIAGNOSIS — M810 Age-related osteoporosis without current pathological fracture: Secondary | ICD-10-CM | POA: Diagnosis present

## 2023-02-11 DIAGNOSIS — I340 Nonrheumatic mitral (valve) insufficiency: Secondary | ICD-10-CM | POA: Diagnosis present

## 2023-02-11 DIAGNOSIS — I4821 Permanent atrial fibrillation: Secondary | ICD-10-CM | POA: Diagnosis present

## 2023-02-11 DIAGNOSIS — Z950 Presence of cardiac pacemaker: Secondary | ICD-10-CM | POA: Diagnosis not present

## 2023-02-11 DIAGNOSIS — Z515 Encounter for palliative care: Secondary | ICD-10-CM | POA: Diagnosis not present

## 2023-02-11 DIAGNOSIS — F0394 Unspecified dementia, unspecified severity, with anxiety: Secondary | ICD-10-CM | POA: Diagnosis present

## 2023-02-11 DIAGNOSIS — Z8249 Family history of ischemic heart disease and other diseases of the circulatory system: Secondary | ICD-10-CM | POA: Diagnosis not present

## 2023-02-11 DIAGNOSIS — D649 Anemia, unspecified: Secondary | ICD-10-CM | POA: Diagnosis present

## 2023-02-11 DIAGNOSIS — Z66 Do not resuscitate: Secondary | ICD-10-CM | POA: Diagnosis present

## 2023-02-11 DIAGNOSIS — I442 Atrioventricular block, complete: Secondary | ICD-10-CM | POA: Diagnosis present

## 2023-02-11 DIAGNOSIS — Z8042 Family history of malignant neoplasm of prostate: Secondary | ICD-10-CM | POA: Diagnosis not present

## 2023-02-11 DIAGNOSIS — Z823 Family history of stroke: Secondary | ICD-10-CM | POA: Diagnosis not present

## 2023-02-11 DIAGNOSIS — E782 Mixed hyperlipidemia: Secondary | ICD-10-CM | POA: Diagnosis present

## 2023-02-11 DIAGNOSIS — Z79890 Hormone replacement therapy (postmenopausal): Secondary | ICD-10-CM | POA: Diagnosis not present

## 2023-02-11 DIAGNOSIS — I2699 Other pulmonary embolism without acute cor pulmonale: Principal | ICD-10-CM

## 2023-02-11 DIAGNOSIS — K219 Gastro-esophageal reflux disease without esophagitis: Secondary | ICD-10-CM | POA: Diagnosis present

## 2023-02-11 DIAGNOSIS — D72829 Elevated white blood cell count, unspecified: Secondary | ICD-10-CM | POA: Diagnosis present

## 2023-02-11 DIAGNOSIS — I119 Hypertensive heart disease without heart failure: Secondary | ICD-10-CM | POA: Diagnosis present

## 2023-02-11 DIAGNOSIS — C55 Malignant neoplasm of uterus, part unspecified: Secondary | ICD-10-CM | POA: Diagnosis present

## 2023-02-11 DIAGNOSIS — H9190 Unspecified hearing loss, unspecified ear: Secondary | ICD-10-CM | POA: Diagnosis present

## 2023-02-11 LAB — HEMOGLOBIN AND HEMATOCRIT, BLOOD
HCT: 26.8 % — ABNORMAL LOW (ref 36.0–46.0)
HCT: 26.6 % — ABNORMAL LOW (ref 36.0–46.0)
Hemoglobin: 7.9 g/dL — ABNORMAL LOW (ref 12.0–15.0)
Hemoglobin: 7.9 g/dL — ABNORMAL LOW (ref 12.0–15.0)

## 2023-02-11 LAB — COMPREHENSIVE METABOLIC PANEL
ALT: 9 U/L (ref 0–44)
AST: 15 U/L (ref 15–41)
Albumin: 3.1 g/dL — ABNORMAL LOW (ref 3.5–5.0)
Alkaline Phosphatase: 75 U/L (ref 38–126)
Anion gap: 9 (ref 5–15)
BUN: 14 mg/dL (ref 8–23)
CO2: 25 mmol/L (ref 22–32)
Calcium: 8.3 mg/dL — ABNORMAL LOW (ref 8.9–10.3)
Chloride: 102 mmol/L (ref 98–111)
Creatinine, Ser: 0.93 mg/dL (ref 0.44–1.00)
GFR, Estimated: 56 mL/min — ABNORMAL LOW (ref >60)
Glucose, Bld: 88 mg/dL (ref 70–99)
Potassium: 3.8 mmol/L (ref 3.5–5.1)
Sodium: 136 mmol/L (ref 135–145)
Total Bilirubin: 0.7 mg/dL (ref 0.0–1.2)
Total Protein: 5.7 g/dL — ABNORMAL LOW (ref 6.5–8.1)

## 2023-02-11 LAB — CBC
HCT: 26.5 % — ABNORMAL LOW (ref 36.0–46.0)
Hemoglobin: 7.8 g/dL — ABNORMAL LOW (ref 12.0–15.0)
MCH: 23.6 pg — ABNORMAL LOW (ref 26.0–34.0)
MCHC: 29.4 g/dL — ABNORMAL LOW (ref 30.0–36.0)
MCV: 80.3 fL (ref 80.0–100.0)
Platelets: 221 10*3/uL (ref 150–400)
RBC: 3.3 MIL/uL — ABNORMAL LOW (ref 3.87–5.11)
RDW: 21 % — ABNORMAL HIGH (ref 11.5–15.5)
WBC: 4.5 10*3/uL (ref 4.0–10.5)
nRBC: 0 % (ref 0.0–0.2)

## 2023-02-11 LAB — MAGNESIUM: Magnesium: 2 mg/dL (ref 1.7–2.4)

## 2023-02-11 LAB — HEPARIN LEVEL (UNFRACTIONATED): Heparin Unfractionated: 0.55 [IU]/mL (ref 0.30–0.70)

## 2023-02-11 MED ORDER — APIXABAN 5 MG PO TABS: 5.0000 mg | ORAL_TABLET | Freq: Two times a day (BID) | ORAL | Status: DC

## 2023-02-11 MED ORDER — APIXABAN 5 MG PO TABS
10.0000 mg | ORAL_TABLET | Freq: Two times a day (BID) | ORAL | Status: DC
  Administered 2023-02-11 – 2023-02-12 (×3): 10 mg via ORAL
  Filled 2023-02-11 (×3): qty 2

## 2023-02-11 NOTE — Progress Notes (Signed)
PROGRESS NOTE    Renee Cameron  ZOX:[id number] DOB: 01/27/1924 DOA: 02/09/2023 PCP: Irven Baltimore, NP    Brief Narrative:   Renee Cameron is a 89 y.o. female with past medical history significant for permanent atrial fibrillation no longer on anticoagulation, complete heart block s/p PPM, HTN, HLD, anxiety, dementia, hard of hearing, recent diagnosis of uterine carcinosarcoma (managed conservatively/palliatively given her comorbidities) who presented to [facility] ED on 2/1 from [facility] due to 1 episode of emesis.  Patient unable to contribute to HPI due to her underlying dementia but denies headache, no dizziness, no chest pain, no shortness of breath, no abdominal pain, no fever, no abdominal pain.  In the ED, temperature 98.8 F, HR 71, RR 18, BP 152/60, SpO2 96% on room air.  WBC 10.8, hemoglobin 8.6, platelet count 264.  Sodium 137, potassium 3.7, chloride 99, CO2 27, glucose 123, BUN 10, creatinine 0.83.  AST 17, ALT 9, total bilirubin 0.7.  High sensitive troponin 51> 46.  CT angiogram chest with nonocclusive segmental pulmonary emboli right middle lobe, left lower lobe with no definite heart strain.  CT abdomen/pelvis with no acute abnormality in the abdomen/pelvis, thickened peripheral enhancing endometrium, aortic atherosclerosis.  Patient was started on IV heparin drip.  LR 500 mL bolus given.  TRH consulted for admission for further evaluation management of pulmonary embolism.  Assessment & Plan:   Acute pulmonary embolism CT angiogram chest with nonocclusive segmental pulmonary emboli right middle lobe, left lower lobe with no definite heart strain.  Patient previously on Xarelto for atrial fibrillation but daughter reports many of her medicines discontinued after diagnosis of endometrial carcinosarcoma with vaginal bleeding.  TTE with LVEF 60 to 65%, no LV regional wall motion abnormalities, mild LVH, RV systolic function normal, LA massively dilated, RA severely  dilated, mild MR, IVC dilated in size. Venous duplex ultrasound bilateral lower extremities negative for DVT. -- Transition heparin drip to Eliquis today --Closely follow hemoglobin today following Eliquis initiation, if has significant drop or if vaginal bleeding worsens; will discontinue anticoagulation; and patient's daughter agreeable to this plan. -- Monitor on telemetry  Permanent atrial fibrillation Follows with cardiology outpatient, Dr. Diona Browner.  Previously on Xarelto but discontinued due to diagnosis of endometrial carcinosarcoma with vaginal bleeding. -- Metoprolol succinate 12.5 mg p.o. daily -- Heparin transition to Eliquis today -- Monitor on telemetry  Endometrial carcinosarcoma Recently diagnosed, seen by gynecology/oncology, Dr. Pricilla Holm outpatient.  Family opting for conservative/palliative treatment given her advanced age, comorbidities and advanced dementia.  History of complete heart block s/p PPM -- Monitor on telemetry -- Outpatient follow-up with electrophysiology  Essential hypertension -- Metoprolol succinate 12.5 mg p.o. daily -- Losartan 25 mg p.o. daily  Hyperlipidemia -- Atorvastatin 40 mg p.o. daily  Anxiety -- Effexor 75 mg p.o. daily (substituted for home Pristiq) -- Alprazolam 0.25 md PO twice daily as needed anxiety  Dementia -- Delirium precautions -- Get up during the day -- Encourage a familiar face to remain present throughout the day -- Keep blinds open and lights on during daylight hours -- Minimize the use of opioids/benzodiazepines -- Aricept 5 mg p.o. nightly -- Namenda 10 mg p.o. daily -- Melatonin 3 mg p.o. nightly  Hard of hearing --Supportive care  GERD -- Famotidine 40 mg p.o. daily   DVT prophylaxis: SCDs Start: 02/10/23 0736 apixaban (ELIQUIS) tablet 10 mg  apixaban (ELIQUIS) tablet 5 mg    Code Status: Limited: Do not attempt resuscitation (DNR) -DNR-LIMITED -Do Not Intubate/DNI  Family Communication: Updated  daughter present at bedside this morning  Disposition Plan:  Level of care: Med-Surg Status is: Observation The patient remains OBS appropriate and will d/c before 2 midnights.    Consultants:  None  Procedures:  TTE Vascular duplex ultrasound bilateral lower extremities  Antimicrobials:  None   Subjective: Seen examined bedside, resting calmly.  Lying in bed.  Daughter present.  Patient sleeping but arousable.  Remains on heparin drip.  Discussed with daughter will transition to Eliquis today.  But if there is a significant hemoglobin drop or increase in vaginal bleeding will likely need to discontinue anticoagulation.  Patient's daughter is agreeable to this plan and understands the difficulty in management given her comorbidities including uterine carcinosarcoma which is a large contributing factor.  Patient pleasantly confused, otherwise no significant complaints this morning.  Denies headache, no dizziness, no chest pain, no palpitations, no shortness of breath, no abdominal pain, no fever, no weakness.  No acute events reported overnight by nursing staff.  Objective: Vitals:   02/10/23 1036 02/10/23 1430 02/10/23 2137 02/11/23 0624  BP: (!) 123/51 (!) 118/53 107/68 (!) 126/54  Pulse: 72 69 73 70  Resp: 20  19 19   Temp: 99 F (37.2 C) 100.1 F (37.8 C) 98.6 F (37 C) 98.3 F (36.8 C)  TempSrc: Oral Oral Oral Oral  SpO2: 91% 92% 90% 94%  Weight:      Height:        Intake/Output Summary (Last 24 hours) at 02/11/2023 1105 Last data filed at 02/11/2023 0703 Gross per 24 hour  Intake 243.04 ml  Output 400 ml  Net -156.96 ml   Filed Weights   02/09/23 2338 02/10/23 0335  Weight: 64.8 kg 61.1 kg    Examination:  Physical Exam: GEN: NAD, alert, pleasantly confused, elderly in appearance, + HOH HEENT: NCAT, PERRL, EOMI, sclera clear, MMM PULM: CTAB w/o wheezes/crackles, normal respiratory effort, on room air CV: RRR w/o M/G/R GI: abd soft, NTND, + BS MSK: no  peripheral edema, moves all extremities independently NEURO: No focal neurological deficit PSYCH: normal mood/affect Integumentary: No concerning rashes/lesions/wounds noted on exposed skin surfaces.    Data Reviewed: I have personally reviewed following labs and imaging studies  CBC: Recent Labs  Lab 02/09/23 2349 02/10/23 1706 02/11/23 0414  WBC 10.8*  --  4.5  HGB 8.6* 7.8* 7.8*  HCT 29.8* 26.0* 26.5*  MCV 80.8  --  80.3  PLT 264  --  221   Basic Metabolic Panel: Recent Labs  Lab 02/09/23 2349 02/10/23 0755 02/11/23 0414  NA 137  --  136  K 3.7  --  3.8  CL 99  --  102  CO2 27  --  25  GLUCOSE 123*  --  88  BUN 10  --  14  CREATININE 0.83  --  0.93  CALCIUM 9.0  --  8.3*  MG 2.0  --  2.0  PHOS  --  3.0  --    GFR: Estimated Creatinine Clearance: 30.6 mL/min (by C-G formula based on SCr of 0.93 mg/dL). Liver Function Tests: Recent Labs  Lab 02/09/23 2349 02/11/23 0414  AST 17 15  ALT 9 9  ALKPHOS 88 75  BILITOT 0.7 0.7  PROT 6.5 5.7*  ALBUMIN 3.7 3.1*   Recent Labs  Lab 02/09/23 2349  LIPASE 90*   No results for input(s): "AMMONIA" in the last 168 hours. Coagulation Profile: No results for input(s): "INR", "PROTIME" in the last 168 hours. Cardiac  Enzymes: No results for input(s): "CKTOTAL", "CKMB", "CKMBINDEX", "TROPONINI" in the last 168 hours. BNP (last 3 results) No results for input(s): "PROBNP" in the last 8760 hours. HbA1C: No results for input(s): "HGBA1C" in the last 72 hours. CBG: No results for input(s): "GLUCAP" in the last 168 hours. Lipid Profile: No results for input(s): "CHOL", "HDL", "LDLCALC", "TRIG", "CHOLHDL", "LDLDIRECT" in the last 72 hours. Thyroid Function Tests: No results for input(s): "TSH", "T4TOTAL", "FREET4", "T3FREE", "THYROIDAB" in the last 72 hours. Anemia Panel: No results for input(s): "VITAMINB12", "FOLATE", "FERRITIN", "TIBC", "IRON", "RETICCTPCT" in the last 72 hours. Sepsis Labs: No results for  input(s): "PROCALCITON", "LATICACIDVEN" in the last 168 hours.  No results found for this or any previous visit (from the past 240 hours).       Radiology Studies: ECHOCARDIOGRAM COMPLETE Result Date: 02/10/2023    ECHOCARDIOGRAM REPORT   Patient Name:   CORDIA MIKLOS Samons Date of Exam: 02/10/2023 Medical Rec #:  [id number]        Height:       64.0 in Accession #:    [id number]       Weight:       134.7 lb Date of Birth:  05-Jan-1925        BSA:          1.654 m Patient Age:    89 years         BP:           149/61 mmHg Patient Gender: F                HR:           75 bpm. [facility] Procedure: 2D Echo, Cardiac Doppler and Color Doppler Indications:    Pulmonary Embolism  History:        Patient has no prior history of Echocardiogram examinations.                 Pacemaker, Arrythmias:Atrial Fibrillation; Risk                 Factors:Hypertension.  Sonographer:    Darlys Gales Referring Phys: [id number] OLADAPO ADEFESO IMPRESSIONS  1. Left ventricular ejection fraction, by estimation, is 60 to 65%. The left ventricle has normal function. The left ventricle has no regional wall motion abnormalities. There is mild left ventricular hypertrophy. Left ventricular diastolic function could not be evaluated. Elevated left ventricular end-diastolic pressure.  2. Right ventricular systolic function is normal. The right ventricular size is normal. There is moderately elevated pulmonary artery systolic pressure. The estimated right ventricular systolic pressure is 54.4 mmHg.  3. Left atrial size was massively dilated (162 ml/m2).  4. Right atrial size was severely dilated.  5. The mitral valve is normal in structure. Mild mitral valve regurgitation.  6. Tricuspid valve regurgitation is mild to moderate.  7. The aortic valve was not well visualized. Aortic valve regurgitation is not visualized. Aortic valve mean gradient measures 5.0 mmHg.  8. The inferior vena cava is dilated in size with <50% respiratory  variability, suggesting right atrial pressure of 15 mmHg. Comparison(s): No prior Echocardiogram. FINDINGS  Left Ventricle: Left ventricular ejection fraction, by estimation, is 60 to 65%. The left ventricle has normal function. The left ventricle has no regional wall motion abnormalities. The left ventricular internal cavity size was normal in size. There is  mild left ventricular hypertrophy. Left ventricular diastolic function could not be evaluated due to atrial fibrillation. Left ventricular diastolic function could  not be evaluated. Elevated left ventricular end-diastolic pressure. Right Ventricle: The right ventricular size is normal. No increase in right ventricular wall thickness. Right ventricular systolic function is normal. There is moderately elevated pulmonary artery systolic pressure. The tricuspid regurgitant velocity is 3.14 m/s, and with an assumed right atrial pressure of 15 mmHg, the estimated right ventricular systolic pressure is 54.4 mmHg. Left Atrium: Left atrial size was massively dilated (162 ml/m2). Right Atrium: Right atrial size was severely dilated. Pericardium: There is no evidence of pericardial effusion. Mitral Valve: The mitral valve is normal in structure. Mild mitral valve regurgitation, with centrally-directed jet. Tricuspid Valve: The tricuspid valve is grossly normal. Tricuspid valve regurgitation is mild to moderate. Aortic Valve: The aortic valve was not well visualized. Aortic valve regurgitation is not visualized. Aortic valve mean gradient measures 5.0 mmHg. Aortic valve peak gradient measures 7.7 mmHg. Aortic valve area, by VTI measures 1.56 cm. Pulmonic Valve: The pulmonic valve was normal in structure. Pulmonic valve regurgitation is not visualized. Aorta: The aortic root and ascending aorta are structurally normal, with no evidence of dilitation. Venous: The inferior vena cava is dilated in size with less than 50% respiratory variability, suggesting right atrial  pressure of 15 mmHg. IAS/Shunts: There is right bowing of the interatrial septum, suggestive of elevated left atrial pressure. No atrial level shunt detected by color flow Doppler. Additional Comments: A device lead is visualized.  LEFT VENTRICLE PLAX 2D LVIDd:         3.10 cm   Diastology LVIDs:         2.20 cm   LV e' medial:    7.40 cm/s LV PW:         1.10 cm   LV E/e' medial:  14.9 LV IVS:        1.20 cm   LV e' lateral:   8.70 cm/s LVOT diam:     1.70 cm   LV E/e' lateral: 12.6 LV SV:         41 LV SV Index:   25 LVOT Area:     2.27 cm  RIGHT VENTRICLE RV S prime:     11.00 cm/s TAPSE (M-mode): 1.8 cm LEFT ATRIUM              Index         RIGHT ATRIUM           Index LA Vol (A2C):   279.0 ml 168.70 ml/m  RA Area:     29.30 cm LA Vol (A4C):   256.0 ml 154.79 ml/m  RA Volume:   89.00 ml  53.81 ml/m LA Biplane Vol: 268.0 ml 162.05 ml/m  AORTIC VALVE AV Area (Vmax):    1.40 cm AV Area (Vmean):   1.39 cm AV Area (VTI):     1.56 cm AV Vmax:           139.00 cm/s AV Vmean:          107.000 cm/s AV VTI:            0.260 m AV Peak Grad:      7.7 mmHg AV Mean Grad:      5.0 mmHg LVOT Vmax:         85.50 cm/s LVOT Vmean:        65.600 cm/s LVOT VTI:          0.179 m LVOT/AV VTI ratio: 0.69 MITRAL VALVE  TRICUSPID VALVE MV Area (PHT): 2.81 cm     TR Peak grad:   39.4 mmHg MV Decel Time: 270 msec     TR Vmax:        314.00 cm/s MV E velocity: 110.00 cm/s                             SHUNTS                             Systemic VTI:  0.18 m                             Systemic Diam: 1.70 cm Zoila Shutter MD Electronically signed by Zoila Shutter MD Signature Date/Time: 02/10/2023/1:53:48 PM    Final    US Venous Img Lower Bilateral (DVT) Result Date: 02/10/2023 CLINICAL DATA:  Pulmonary embolism.  Evaluate for DVT. EXAM: BILATERAL LOWER EXTREMITY VENOUS DOPPLER ULTRASOUND TECHNIQUE: Gray-scale sonography with graded compression, as well as color Doppler and duplex ultrasound were performed to evaluate  the lower extremity deep venous systems from the level of the common femoral vein and including the common femoral, femoral, profunda femoral, popliteal and calf veins including the posterior tibial, peroneal and gastrocnemius veins when visible. The superficial great saphenous vein was also interrogated. Spectral Doppler was utilized to evaluate flow at rest and with distal augmentation maneuvers in the common femoral, femoral and popliteal veins. COMPARISON:  Chest CTA-earlier same day FINDINGS: RIGHT LOWER EXTREMITY Common Femoral Vein: No evidence of thrombus. Normal compressibility, respiratory phasicity and response to augmentation. Saphenofemoral Junction: No evidence of thrombus. Normal compressibility and flow on color Doppler imaging. Profunda Femoral Vein: No evidence of thrombus. Normal compressibility and flow on color Doppler imaging. Femoral Vein: No evidence of thrombus. Normal compressibility, respiratory phasicity and response to augmentation. Popliteal Vein: No evidence of thrombus. Normal compressibility, respiratory phasicity and response to augmentation. Calf Veins: No evidence of thrombus. Normal compressibility and flow on color Doppler imaging. Superficial Great Saphenous Vein: No evidence of thrombus. Normal compressibility. Other Findings:  None. LEFT LOWER EXTREMITY Common Femoral Vein: No evidence of thrombus. Normal compressibility, respiratory phasicity and response to augmentation. Saphenofemoral Junction: No evidence of thrombus. Normal compressibility and flow on color Doppler imaging. Profunda Femoral Vein: No evidence of thrombus. Normal compressibility and flow on color Doppler imaging. Femoral Vein: No evidence of thrombus. Normal compressibility, respiratory phasicity and response to augmentation. Popliteal Vein: No evidence of thrombus. Normal compressibility, respiratory phasicity and response to augmentation. Calf Veins: No evidence of thrombus. Normal compressibility and  flow on color Doppler imaging. Superficial Great Saphenous Vein: No evidence of thrombus. Normal compressibility. Other Findings:  None. IMPRESSION: No evidence of DVT within either lower extremity. Electronically Signed   By: Simonne Come M.D.   On: 02/10/2023 11:02   CT Angio Chest PE W and/or Wo Contrast Result Date: 02/10/2023 CLINICAL DATA:  89 year old female with emesis and abdominal pain. Patient has dementia and is unable to elaborate on symptoms. PE suspected. EXAM: CT ANGIOGRAPHY CHEST CT ABDOMEN AND PELVIS WITH CONTRAST TECHNIQUE: Multidetector CT imaging of the chest was performed using the standard protocol during bolus administration of intravenous contrast. Multiplanar CT image reconstructions and MIPs were obtained to evaluate the vascular anatomy. Multidetector CT imaging of the abdomen and pelvis was performed using the standard protocol during bolus administration of intravenous  contrast. RADIATION DOSE REDUCTION: This exam was performed according to the departmental dose-optimization program which includes automated exposure control, adjustment of the mA and/or kV according to patient size and/or use of iterative reconstruction technique. CONTRAST:  OMNIPAQUE IOHEXOL 350 MG/ML SOLN COMPARISON:  CT chest 09/28/2017 and CT abdomen and pelvis 03/14/2014 FINDINGS: CTA CHEST FINDINGS Cardiovascular: Nonocclusive segmental pulmonary emboli in a right middle lobe pulmonary artery (series 3/image 161) and left lower lobe pulmonary artery (series 3/image 161). No definite right heart strain though evaluation is limited by marked cardiomegaly. Trace pericardial effusion. Coronary artery and aortic atherosclerotic calcification. Left chest wall ICD. Mediastinum/Nodes: Trachea and esophagus are unremarkable. No thoracic adenopathy. Lungs/Pleura: Scarring in the lung bases. No focal pneumonia, pleural effusion, or pneumothorax. Musculoskeletal: No acute fracture. Review of the MIP images confirms the  above findings. CT ABDOMEN and PELVIS FINDINGS Hepatobiliary: No acute abnormality. Pancreas: Unremarkable. Spleen: Unremarkable. Adrenals/Urinary Tract: Normal adrenal glands. No urinary calculi or hydronephrosis. Unremarkable bladder. Stomach/Bowel: Stomach is within normal limits. No bowel wall thickening or bowel obstruction. The appendix is not definitively seen. No secondary signs of appendicitis. Colonic diverticulosis without diverticulitis. Vascular/Lymphatic: Aortic atherosclerosis. No enlarged abdominal or pelvic lymph nodes. Reproductive: Thickened peripherally enhancing endometrium. No adnexal mass. Other: No free intraperitoneal fluid or air. Musculoskeletal: Demineralization.  No acute fracture. Review of the MIP images confirms the above findings. IMPRESSION: 1. Nonocclusive segmental pulmonary emboli in the right middle lobe and left lower lobe pulmonary arteries. No definite right heart strain though evaluation is limited by marked cardiomegaly. 2. No acute abnormality in the abdomen or pelvis. 3. Thickened peripherally enhancing endometrium. If clinically appropriate given comorbidities and life expectancy, consider nonemergent pelvic ultrasound for further evaluation. 4.  Aortic Atherosclerosis (ICD10-I70.0). Critical Value/emergent results were called by telephone at the time of interpretation on 02/10/2023 at 1:29 am to provider [facility] , who verbally acknowledged these results. Electronically Signed   By: Minerva Fester M.D.   On: 02/10/2023 01:30   CT ABDOMEN PELVIS W CONTRAST Result Date: 02/10/2023 CLINICAL DATA:  89 year old female with emesis and abdominal pain. Patient has dementia and is unable to elaborate on symptoms. PE suspected. EXAM: CT ANGIOGRAPHY CHEST CT ABDOMEN AND PELVIS WITH CONTRAST TECHNIQUE: Multidetector CT imaging of the chest was performed using the standard protocol during bolus administration of intravenous contrast. Multiplanar CT image reconstructions and MIPs  were obtained to evaluate the vascular anatomy. Multidetector CT imaging of the abdomen and pelvis was performed using the standard protocol during bolus administration of intravenous contrast. RADIATION DOSE REDUCTION: This exam was performed according to the departmental dose-optimization program which includes automated exposure control, adjustment of the mA and/or kV according to patient size and/or use of iterative reconstruction technique. CONTRAST:  OMNIPAQUE IOHEXOL 350 MG/ML SOLN COMPARISON:  CT chest 09/28/2017 and CT abdomen and pelvis 03/14/2014 FINDINGS: CTA CHEST FINDINGS Cardiovascular: Nonocclusive segmental pulmonary emboli in a right middle lobe pulmonary artery (series 3/image 161) and left lower lobe pulmonary artery (series 3/image 161). No definite right heart strain though evaluation is limited by marked cardiomegaly. Trace pericardial effusion. Coronary artery and aortic atherosclerotic calcification. Left chest wall ICD. Mediastinum/Nodes: Trachea and esophagus are unremarkable. No thoracic adenopathy. Lungs/Pleura: Scarring in the lung bases. No focal pneumonia, pleural effusion, or pneumothorax. Musculoskeletal: No acute fracture. Review of the MIP images confirms the above findings. CT ABDOMEN and PELVIS FINDINGS Hepatobiliary: No acute abnormality. Pancreas: Unremarkable. Spleen: Unremarkable. Adrenals/Urinary Tract: Normal adrenal glands. No urinary calculi or hydronephrosis. Unremarkable  bladder. Stomach/Bowel: Stomach is within normal limits. No bowel wall thickening or bowel obstruction. The appendix is not definitively seen. No secondary signs of appendicitis. Colonic diverticulosis without diverticulitis. Vascular/Lymphatic: Aortic atherosclerosis. No enlarged abdominal or pelvic lymph nodes. Reproductive: Thickened peripherally enhancing endometrium. No adnexal mass. Other: No free intraperitoneal fluid or air. Musculoskeletal: Demineralization.  No acute fracture. Review of  the MIP images confirms the above findings. IMPRESSION: 1. Nonocclusive segmental pulmonary emboli in the right middle lobe and left lower lobe pulmonary arteries. No definite right heart strain though evaluation is limited by marked cardiomegaly. 2. No acute abnormality in the abdomen or pelvis. 3. Thickened peripherally enhancing endometrium. If clinically appropriate given comorbidities and life expectancy, consider nonemergent pelvic ultrasound for further evaluation. 4.  Aortic Atherosclerosis (ICD10-I70.0). Critical Value/emergent results were called by telephone at the time of interpretation on 02/10/2023 at 1:29 am to provider [facility] , who verbally acknowledged these results. Electronically Signed   By: Minerva Fester M.D.   On: 02/10/2023 01:30        Scheduled Meds:  apixaban  10 mg Oral BID   Followed by   Melene Muller ON 02/18/2023] apixaban  5 mg Oral BID   atorvastatin  40 mg Oral Daily   donepezil  5 mg Oral QHS   famotidine  40 mg Oral QHS   losartan  25 mg Oral Daily   memantine  10 mg Oral Daily   metoprolol succinate  12.5 mg Oral Daily   venlafaxine XR  75 mg Oral Q breakfast   Continuous Infusions:     LOS: 0 days    Time spent: 51 minutes spent on chart review, discussion with nursing staff, consultants, updating family and interview/physical exam; more than 50% of that time was spent in counseling and/or coordination of care.    Alvira Philips [place], DO [facility] Available via Epic secure chat 7am-7pm After these hours, please refer to coverage provider listed on [email or web address] 02/11/2023, 11:05 AM

## 2023-02-11 NOTE — Progress Notes (Signed)
Assisted to chair this morning with minimal assist.  Sat in chair for several hours with bed alarm in place.  Stopped heparin drip this morning and started on eliquis.  Daughter is at bedside now after going home for shower and visiting [place] to give them updates.

## 2023-02-11 NOTE — Plan of Care (Signed)
   Problem: Safety: Goal: Ability to remain free from injury will improve Outcome: Progressing

## 2023-02-11 NOTE — Progress Notes (Signed)
PHARMACY - ANTICOAGULATION CONSULT NOTE  Pharmacy Consult for heparin=> eliquis Indication: pulmonary embolus  No Known Allergies  Patient Measurements: Height: 5\' 4"  (162.6 cm) Weight: 61.1 kg (134 lb 11.2 oz) IBW/kg (Calculated) : 54.7  Vital Signs: Temp: 98.3 F (36.8 C) (02/03 0624) Temp Source: Oral (02/03 0624) BP: 126/54 (02/03 0624) Pulse Rate: 70 (02/03 0624)  Labs: Recent Labs    02/09/23 2349 02/10/23 0133 02/10/23 1052 02/10/23 1706 02/10/23 1814 02/11/23 0414  HGB 8.6*  --   --  7.8*  --  7.8*  HCT 29.8*  --   --  26.0*  --  26.5*  PLT 264  --   --   --   --  221  APTT  --   --  104*  --   --   --   HEPARINUNFRC  --   --  0.40  --  0.40 0.55  CREATININE 0.83  --   --   --   --  0.93  TROPONINIHS 51* 46*  --   --   --   --     Estimated Creatinine Clearance: 30.6 mL/min (by C-G formula based on SCr of 0.93 mg/dL).   Medical History: Past Medical History:  Diagnosis Date   Anxiety    Atrial fibrillation (HCC)    Chronic anticoagulation    Complete heart block (HCC)    a. s/p STJ PPM 05/2017.   Dementia [facility])    Depression    Essential hypertension    Hyperthyroidism 2000   2000   Macular degeneration    Osteoporosis    Presence of permanent cardiac pacemaker     Assessment: 89yo female presented to ED from SNF for one episode of emesis, CT reveals nonocclusive PE, to begin heparin. Patient previously on Xarelto for atrial fibrillation but daughter reports many of her medicines discontinued after diagnosis of endometrial carcinosarcoma with vaginal bleeding. Not on med rec from [facility]  HL 0.55 is therapeutic. No issues with infusion Plan to transition to PO DOAC once ECHO done which is completed. MD okay to transition to Eliquis  Goal of Therapy:  Heparin level 0.3-0.7 units/ml aPTT 66-102 seconds Monitor platelets by anticoagulation protocol: Yes   Plan:  D/C Heparin Eliquis 10mg  po bid x 7 days, then 5mg  po bid Educate on eliquis  CBC  daily  Monitor for S/S of bleeding  Elder Cyphers, BS Pharm D, BCPS Clinical Pharmacist 02/11/2023,8:29 AM

## 2023-02-11 NOTE — TOC Progression Note (Signed)
Transition of Care [facility]) - Progression Note    Patient Details  Name: Renee Cameron MRN: [id number] Date of Birth: Mar 03, 1924  Transition of [facility]) CM/SW Contact  Elliot Gault, LCSW Phone Number: 02/11/2023, 10:12 AM  Clinical Narrative:     TOC following. MD anticipating dc tomorrow. Updated Marcelino Duster at [place] who state that they do not need to come assess pt.  Will follow.  Expected Discharge Plan: Assisted Living Barriers to Discharge: Continued Medical Work up  Expected Discharge Plan and Services In-house Referral: Clinical Social Work   Post Acute Care Choice: Resumption of Svcs/PTA Provider Living arrangements for the past 2 months: Assisted Living Facility                                       Social Determinants of Health (SDOH) Interventions SDOH Screenings   Food Insecurity: No Food Insecurity (02/10/2023)  Housing: Unknown (02/10/2023)  Transportation Needs: No Transportation Needs (02/10/2023)  Utilities: Not At Risk (02/10/2023)  Depression (PHQ2-9): Low Risk  (10/10/2022)  Social Connections: Moderately Integrated (02/10/2023)  Tobacco Use: Low Risk  (02/09/2023)    Readmission Risk Interventions     No data to display

## 2023-02-12 DIAGNOSIS — I2699 Other pulmonary embolism without acute cor pulmonale: Secondary | ICD-10-CM | POA: Diagnosis not present

## 2023-02-12 LAB — CBC
HCT: 27.2 % — ABNORMAL LOW (ref 36.0–46.0)
Hemoglobin: 7.7 g/dL — ABNORMAL LOW (ref 12.0–15.0)
MCH: 22.9 pg — ABNORMAL LOW (ref 26.0–34.0)
MCHC: 28.3 g/dL — ABNORMAL LOW (ref 30.0–36.0)
MCV: 81 fL (ref 80.0–100.0)
Platelets: 219 10*3/uL (ref 150–400)
RBC: 3.36 MIL/uL — ABNORMAL LOW (ref 3.87–5.11)
RDW: 21.2 % — ABNORMAL HIGH (ref 11.5–15.5)
WBC: 4.3 10*3/uL (ref 4.0–10.5)
nRBC: 0 % (ref 0.0–0.2)

## 2023-02-12 MED ORDER — APIXABAN (ELIQUIS) VTE STARTER PACK (10MG AND 5MG): ORAL_TABLET | ORAL | 0 refills | Status: DC

## 2023-02-12 MED ORDER — ALPRAZOLAM 0.25 MG PO TABS: 0.2500 mg | ORAL_TABLET | Freq: Two times a day (BID) | ORAL | 0 refills | Status: DC | PRN

## 2023-02-12 MED ORDER — ALPRAZOLAM 0.25 MG PO TABS: 0.2500 mg | ORAL_TABLET | Freq: Two times a day (BID) | ORAL | 0 refills | Status: AC

## 2023-02-12 NOTE — Discharge Summary (Addendum)
 Physician Discharge Summary  Renee Cameron FMW:[id number] DOB: 24-Mar-1924 DOA: 02/09/2023  PCP: Renee Jacobsen, NP  Admit date: 02/09/2023 Discharge date: 02/12/2023  Admitted From: [facility] Disposition: [facility]  Recommendations for Outpatient Follow-up:  Follow up with PCP in 1-2 weeks Started on Eliquis  for acute pulmonary embolism Recommend repeat CBC 1 week, if significant drop in hemoglobin or if significant recurrent vaginal bleeding likely need to discontinue anticoagulation given her underlying uterine carcinosarcoma Continue outpatient follow-up with [facility]  Home health: PT/RN  Discharge Condition: Stable CODE STATUS: DNR Diet recommendation: Heart healthy diet  History of present illness:  Renee Cameron is a 89 y.o. female with past medical history significant for permanent atrial fibrillation no longer on anticoagulation, complete heart block s/p PPM, HTN, HLD, anxiety, dementia, hard of hearing, recent diagnosis of uterine carcinosarcoma (managed conservatively/palliatively given her comorbidities) who presented to [facility] ED on 2/1 from [facility] due to 1 episode of emesis.  Patient unable to contribute to HPI due to her underlying dementia but denies headache, no dizziness, no chest pain, no shortness of breath, no abdominal pain, no fever, no abdominal pain.   In the ED, temperature 98.8 F, HR 71, RR 18, BP 152/60, SpO2 96% on room air.  WBC 10.8, hemoglobin 8.6, platelet count 264.  Sodium 137, potassium 3.7, chloride 99, CO2 27, glucose 123, BUN 10, creatinine 0.83.  AST 17, ALT 9, total bilirubin 0.7.  High sensitive troponin 51> 46.  CT angiogram chest with nonocclusive segmental pulmonary emboli right middle lobe, left lower lobe with no definite heart strain.  CT abdomen/pelvis with no acute abnormality in the abdomen/pelvis, thickened peripheral enhancing endometrium, aortic atherosclerosis.  Patient was started on IV heparin   drip.  LR 500 mL bolus given.  TRH consulted for admission for further evaluation management of pulmonary embolism.  Hospital course:  Acute pulmonary embolism CT angiogram chest with nonocclusive segmental pulmonary emboli right middle lobe, left lower lobe with no definite heart strain.  Patient previously on Xarelto  for atrial fibrillation but daughter reports many of her medicines discontinued after diagnosis of endometrial carcinosarcoma with vaginal bleeding.  TTE with LVEF 60 to 65%, no LV regional wall motion abnormalities, mild LVH, RV systolic function normal, LA massively dilated, RA severely dilated, mild MR, IVC dilated in size. Venous duplex ultrasound bilateral lower extremities negative for DVT.  Initially placed on heparin  drip and transition to Eliquis  on 02/11/2023.  Hemoglobin remained stable 7.7 at time of discharge.  Will continue Eliquis  for now but if has significant drop in hemoglobin or vaginal bleeding would recommend discontinue anticoagulation in which daughter is agreeable to this plan given her underlying uterine carcinosarcoma.  Recommend repeat CBC 1 week.   Permanent atrial fibrillation Follows with cardiology outpatient, Dr. Debera.  Previously on Xarelto  but discontinued due to diagnosis of endometrial carcinosarcoma with vaginal bleeding.  Continue metoprolol  succinate 12.5 mg p.o. daily.  Restarted on anticoagulation with Eliquis  as above.  Repeat CBC 1 week.   Endometrial carcinosarcoma Recently diagnosed, seen by gynecology/oncology, Dr. Viktoria outpatient.  Family opting for conservative/palliative treatment given her advanced age, comorbidities and advanced dementia.  Follows with [facility] outpatient.   History of complete heart block s/p PPM Outpatient follow-up with electrophysiology   Essential hypertension Metoprolol  succinate 12.5 mg p.o. daily, Losartan  25 mg p.o. daily   Hyperlipidemia Atorvastatin  40 mg p.o. daily   Anxiety Continue  Pristiq , Alprazolam  0.25 md PO twice daily    Dementia Continue  Aricept  5 mg p.o. nightly, Namenda  10 mg p.o. daily   Hard of hearing Supportive care   GERD  Famotidine  40 mg p.o. daily  Discharge Diagnoses:  Principal Problem:   Pulmonary embolism (HCC) Active Problems:   Permanent atrial fibrillation (HCC)   Essential hypertension   Vomiting in adult   Elevated troponin   Elevated lipase   Dementia without behavioral disturbance (HCC)   Mixed hyperlipidemia   GERD (gastroesophageal reflux disease)    Discharge Instructions  Discharge Instructions     Call MD for:  difficulty breathing, headache or visual disturbances   Complete by: As directed    Call MD for:  extreme fatigue   Complete by: As directed    Call MD for:  persistant dizziness or light-headedness   Complete by: As directed    Call MD for:  persistant nausea and vomiting   Complete by: As directed    Call MD for:  severe uncontrolled pain   Complete by: As directed    Call MD for:  temperature >100.4   Complete by: As directed    Diet - low sodium heart healthy   Complete by: As directed    Increase activity slowly   Complete by: As directed       Allergies as of 02/12/2023   No Known Allergies      Medication List     TAKE these medications    acetaminophen  325 MG tablet Commonly known as: Tylenol  Take 2 tablets (650 mg total) by mouth every 6 (six) hours as needed.   ALPRAZolam  0.25 MG tablet Commonly known as: XANAX  Take 1 tablet (0.25 mg total) by mouth 2 (two) times daily. What changed:  when to take this reasons to take this   Apixaban  Starter Pack (10mg  and 5mg ) Commonly known as: ELIQUIS  STARTER PACK Take as directed on package: start with two-5mg  tablets twice daily for 7 days. On day 8, switch to one-5mg  tablet twice daily.   atorvastatin  40 MG tablet Commonly known as: LIPITOR Take 40 mg by mouth daily.   Cholecalciferol 50 MCG (2000 UT) Caps Take 1 capsule by mouth  daily.   cyanocobalamin 500 MCG tablet Commonly known as: VITAMIN B12 Take 500 mcg by mouth every other day. Take 1 tablet by mouth every Monday Wednesday and Friday   desvenlafaxine  50 MG 24 hr tablet Commonly known as: PRISTIQ  Take 1 tablet by mouth daily.   donepezil  5 MG tablet Commonly known as: ARICEPT  Take 5 mg by mouth at bedtime.   eucerin lotion Apply 1 Application topically in the morning and at bedtime. Eczema relief 1% Apply to entire body   famotidine  40 MG tablet Commonly known as: PEPCID  Take 40 mg by mouth daily.   ferrous sulfate 325 (65 FE) MG tablet Take 325 mg by mouth 2 (two) times daily with a meal.   furosemide  20 MG tablet Commonly known as: LASIX  Take 20 mg by mouth daily.   losartan  25 MG tablet Commonly known as: COZAAR  Take 25 mg by mouth daily.   memantine  10 MG tablet Commonly known as: NAMENDA  Take 1 tablet by mouth daily.   metoprolol  succinate 12.5 mg Tb24 24 hr tablet Commonly known as: TOPROL -XL Take 12.5 mg by mouth daily.   potassium chloride  10 MEQ tablet Commonly known as: KLOR-CON  M Take 1 tablet by mouth daily.   PreserVision AREDS 2+Multi Vit Caps Take 2 capsules by mouth daily.   REFRESH OP Apply 1 drop to eye in  the morning, at noon, and at bedtime. 1 drop in both eyes 3 times a day        Follow-up Information     Renee Jacobsen, NP. Schedule an appointment as soon as possible for a visit in 1 week(s).   Specialties: Nurse Practitioner, Gerontology Contact information: [address] [place] [address] [phone]                No Known Allergies  Consultations: None  Procedures/Studies: ECHOCARDIOGRAM COMPLETE Result Date: 02/10/2023    ECHOCARDIOGRAM REPORT   Patient Name:   KRISHANA LUTZE Reesman Date of Exam: 02/10/2023 Medical Rec #:  [id number]        Height:       64.0 in Accession #:    [id number]       Weight:       134.7 lb Date of Birth:  07-30-1924        BSA:          1.654 m  Patient Age:    89 years         BP:           149/61 mmHg Patient Gender: F                HR:           75 bpm. Exam Location:  [facility] Procedure: 2D Echo, Cardiac Doppler and Color Doppler Indications:    Pulmonary Embolism  History:        Patient has no prior history of Echocardiogram examinations.                 Pacemaker, Arrythmias:Atrial Fibrillation; Risk                 Factors:Hypertension.  Sonographer:    Jayson Gaskins Referring Phys: [id number] OLADAPO ADEFESO IMPRESSIONS  1. Left ventricular ejection fraction, by estimation, is 60 to 65%. The left ventricle has normal function. The left ventricle has no regional wall motion abnormalities. There is mild left ventricular hypertrophy. Left ventricular diastolic function could not be evaluated. Elevated left ventricular end-diastolic pressure.  2. Right ventricular systolic function is normal. The right ventricular size is normal. There is moderately elevated pulmonary artery systolic pressure. The estimated right ventricular systolic pressure is 54.4 mmHg.  3. Left atrial size was massively dilated (162 ml/m2).  4. Right atrial size was severely dilated.  5. The mitral valve is normal in structure. Mild mitral valve regurgitation.  6. Tricuspid valve regurgitation is mild to moderate.  7. The aortic valve was not well visualized. Aortic valve regurgitation is not visualized. Aortic valve mean gradient measures 5.0 mmHg.  8. The inferior vena cava is dilated in size with <50% respiratory variability, suggesting right atrial pressure of 15 mmHg. Comparison(s): No prior Echocardiogram. FINDINGS  Left Ventricle: Left ventricular ejection fraction, by estimation, is 60 to 65%. The left ventricle has normal function. The left ventricle has no regional wall motion abnormalities. The left ventricular internal cavity size was normal in size. There is  mild left ventricular hypertrophy. Left ventricular diastolic function could not be evaluated due to atrial  fibrillation. Left ventricular diastolic function could not be evaluated. Elevated left ventricular end-diastolic pressure. Right Ventricle: The right ventricular size is normal. No increase in right ventricular wall thickness. Right ventricular systolic function is normal. There is moderately elevated pulmonary artery systolic pressure. The tricuspid regurgitant velocity is 3.14 m/s, and with an assumed right atrial pressure of 15  mmHg, the estimated right ventricular systolic pressure is 54.4 mmHg. Left Atrium: Left atrial size was massively dilated (162 ml/m2). Right Atrium: Right atrial size was severely dilated. Pericardium: There is no evidence of pericardial effusion. Mitral Valve: The mitral valve is normal in structure. Mild mitral valve regurgitation, with centrally-directed jet. Tricuspid Valve: The tricuspid valve is grossly normal. Tricuspid valve regurgitation is mild to moderate. Aortic Valve: The aortic valve was not well visualized. Aortic valve regurgitation is not visualized. Aortic valve mean gradient measures 5.0 mmHg. Aortic valve peak gradient measures 7.7 mmHg. Aortic valve area, by VTI measures 1.56 cm. Pulmonic Valve: The pulmonic valve was normal in structure. Pulmonic valve regurgitation is not visualized. Aorta: The aortic root and ascending aorta are structurally normal, with no evidence of dilitation. Venous: The inferior vena cava is dilated in size with less than 50% respiratory variability, suggesting right atrial pressure of 15 mmHg. IAS/Shunts: There is right bowing of the interatrial septum, suggestive of elevated left atrial pressure. No atrial level shunt detected by color flow Doppler. Additional Comments: A device lead is visualized.  LEFT VENTRICLE PLAX 2D LVIDd:         3.10 cm   Diastology LVIDs:         2.20 cm   LV e' medial:    7.40 cm/s LV PW:         1.10 cm   LV E/e' medial:  14.9 LV IVS:        1.20 cm   LV e' lateral:   8.70 cm/s LVOT diam:     1.70 cm   LV E/e'  lateral: 12.6 LV SV:         41 LV SV Index:   25 LVOT Area:     2.27 cm  RIGHT VENTRICLE RV S prime:     11.00 cm/s TAPSE (M-mode): 1.8 cm LEFT ATRIUM              Index         RIGHT ATRIUM           Index LA Vol (A2C):   279.0 ml 168.70 ml/m  RA Area:     29.30 cm LA Vol (A4C):   256.0 ml 154.79 ml/m  RA Volume:   89.00 ml  53.81 ml/m LA Biplane Vol: 268.0 ml 162.05 ml/m  AORTIC VALVE AV Area (Vmax):    1.40 cm AV Area (Vmean):   1.39 cm AV Area (VTI):     1.56 cm AV Vmax:           139.00 cm/s AV Vmean:          107.000 cm/s AV VTI:            0.260 m AV Peak Grad:      7.7 mmHg AV Mean Grad:      5.0 mmHg LVOT Vmax:         85.50 cm/s LVOT Vmean:        65.600 cm/s LVOT VTI:          0.179 m LVOT/AV VTI ratio: 0.69 MITRAL VALVE                TRICUSPID VALVE MV Area (PHT): 2.81 cm     TR Peak grad:   39.4 mmHg MV Decel Time: 270 msec     TR Vmax:        314.00 cm/s MV E velocity: 110.00 cm/s  SHUNTS                             Systemic VTI:  0.18 m                             Systemic Diam: 1.70 cm Vinie Maxcy MD Electronically signed by Vinie Maxcy MD Signature Date/Time: 02/10/2023/1:53:48 PM    Final    US  Venous Img Lower Bilateral (DVT) Result Date: 02/10/2023 CLINICAL DATA:  Pulmonary embolism.  Evaluate for DVT. EXAM: BILATERAL LOWER EXTREMITY VENOUS DOPPLER ULTRASOUND TECHNIQUE: Gray-scale sonography with graded compression, as well as color Doppler and duplex ultrasound were performed to evaluate the lower extremity deep venous systems from the level of the common femoral vein and including the common femoral, femoral, profunda femoral, popliteal and calf veins including the posterior tibial, peroneal and gastrocnemius veins when visible. The superficial great saphenous vein was also interrogated. Spectral Doppler was utilized to evaluate flow at rest and with distal augmentation maneuvers in the common femoral, femoral and popliteal veins. COMPARISON:  Chest  CTA-earlier same day FINDINGS: RIGHT LOWER EXTREMITY Common Femoral Vein: No evidence of thrombus. Normal compressibility, respiratory phasicity and response to augmentation. Saphenofemoral Junction: No evidence of thrombus. Normal compressibility and flow on color Doppler imaging. Profunda Femoral Vein: No evidence of thrombus. Normal compressibility and flow on color Doppler imaging. Femoral Vein: No evidence of thrombus. Normal compressibility, respiratory phasicity and response to augmentation. Popliteal Vein: No evidence of thrombus. Normal compressibility, respiratory phasicity and response to augmentation. Calf Veins: No evidence of thrombus. Normal compressibility and flow on color Doppler imaging. Superficial Great Saphenous Vein: No evidence of thrombus. Normal compressibility. Other Findings:  None. LEFT LOWER EXTREMITY Common Femoral Vein: No evidence of thrombus. Normal compressibility, respiratory phasicity and response to augmentation. Saphenofemoral Junction: No evidence of thrombus. Normal compressibility and flow on color Doppler imaging. Profunda Femoral Vein: No evidence of thrombus. Normal compressibility and flow on color Doppler imaging. Femoral Vein: No evidence of thrombus. Normal compressibility, respiratory phasicity and response to augmentation. Popliteal Vein: No evidence of thrombus. Normal compressibility, respiratory phasicity and response to augmentation. Calf Veins: No evidence of thrombus. Normal compressibility and flow on color Doppler imaging. Superficial Great Saphenous Vein: No evidence of thrombus. Normal compressibility. Other Findings:  None. IMPRESSION: No evidence of DVT within either lower extremity. Electronically Signed   By: Norleen Roulette M.D.   On: 02/10/2023 11:02   CT Angio Chest PE W and/or Wo Contrast Result Date: 02/10/2023 CLINICAL DATA:  89 year old female with emesis and abdominal pain. Patient has dementia and is unable to elaborate on symptoms. PE  suspected. EXAM: CT ANGIOGRAPHY CHEST CT ABDOMEN AND PELVIS WITH CONTRAST TECHNIQUE: Multidetector CT imaging of the chest was performed using the standard protocol during bolus administration of intravenous contrast. Multiplanar CT image reconstructions and MIPs were obtained to evaluate the vascular anatomy. Multidetector CT imaging of the abdomen and pelvis was performed using the standard protocol during bolus administration of intravenous contrast. RADIATION DOSE REDUCTION: This exam was performed according to the departmental dose-optimization program which includes automated exposure control, adjustment of the mA and/or kV according to patient size and/or use of iterative reconstruction technique. CONTRAST:  OMNIPAQUE  IOHEXOL  350 MG/ML SOLN COMPARISON:  CT chest 09/28/2017 and CT abdomen and pelvis 03/14/2014 FINDINGS: CTA CHEST FINDINGS Cardiovascular: Nonocclusive segmental pulmonary emboli in a right middle lobe pulmonary artery (series  3/image 161) and left lower lobe pulmonary artery (series 3/image 161). No definite right heart strain though evaluation is limited by marked cardiomegaly. Trace pericardial effusion. Coronary artery and aortic atherosclerotic calcification. Left chest wall ICD. Mediastinum/Nodes: Trachea and esophagus are unremarkable. No thoracic adenopathy. Lungs/Pleura: Scarring in the lung bases. No focal pneumonia, pleural effusion, or pneumothorax. Musculoskeletal: No acute fracture. Review of the MIP images confirms the above findings. CT ABDOMEN and PELVIS FINDINGS Hepatobiliary: No acute abnormality. Pancreas: Unremarkable. Spleen: Unremarkable. Adrenals/Urinary Tract: Normal adrenal glands. No urinary calculi or hydronephrosis. Unremarkable bladder. Stomach/Bowel: Stomach is within normal limits. No bowel wall thickening or bowel obstruction. The appendix is not definitively seen. No secondary signs of appendicitis. Colonic diverticulosis without diverticulitis.  Vascular/Lymphatic: Aortic atherosclerosis. No enlarged abdominal or pelvic lymph nodes. Reproductive: Thickened peripherally enhancing endometrium. No adnexal mass. Other: No free intraperitoneal fluid or air. Musculoskeletal: Demineralization.  No acute fracture. Review of the MIP images confirms the above findings. IMPRESSION: 1. Nonocclusive segmental pulmonary emboli in the right middle lobe and left lower lobe pulmonary arteries. No definite right heart strain though evaluation is limited by marked cardiomegaly. 2. No acute abnormality in the abdomen or pelvis. 3. Thickened peripherally enhancing endometrium. If clinically appropriate given comorbidities and life expectancy, consider nonemergent pelvic ultrasound for further evaluation. 4.  Aortic Atherosclerosis (ICD10-I70.0). Critical Value/emergent results were called by telephone at the time of interpretation on 02/10/2023 at 1:29 am to provider [facility] , who verbally acknowledged these results. Electronically Signed   By: Norman Gatlin M.D.   On: 02/10/2023 01:30   CT ABDOMEN PELVIS W CONTRAST Result Date: 02/10/2023 CLINICAL DATA:  89 year old female with emesis and abdominal pain. Patient has dementia and is unable to elaborate on symptoms. PE suspected. EXAM: CT ANGIOGRAPHY CHEST CT ABDOMEN AND PELVIS WITH CONTRAST TECHNIQUE: Multidetector CT imaging of the chest was performed using the standard protocol during bolus administration of intravenous contrast. Multiplanar CT image reconstructions and MIPs were obtained to evaluate the vascular anatomy. Multidetector CT imaging of the abdomen and pelvis was performed using the standard protocol during bolus administration of intravenous contrast. RADIATION DOSE REDUCTION: This exam was performed according to the departmental dose-optimization program which includes automated exposure control, adjustment of the mA and/or kV according to patient size and/or use of iterative reconstruction technique.  CONTRAST:  OMNIPAQUE  IOHEXOL  350 MG/ML SOLN COMPARISON:  CT chest 09/28/2017 and CT abdomen and pelvis 03/14/2014 FINDINGS: CTA CHEST FINDINGS Cardiovascular: Nonocclusive segmental pulmonary emboli in a right middle lobe pulmonary artery (series 3/image 161) and left lower lobe pulmonary artery (series 3/image 161). No definite right heart strain though evaluation is limited by marked cardiomegaly. Trace pericardial effusion. Coronary artery and aortic atherosclerotic calcification. Left chest wall ICD. Mediastinum/Nodes: Trachea and esophagus are unremarkable. No thoracic adenopathy. Lungs/Pleura: Scarring in the lung bases. No focal pneumonia, pleural effusion, or pneumothorax. Musculoskeletal: No acute fracture. Review of the MIP images confirms the above findings. CT ABDOMEN and PELVIS FINDINGS Hepatobiliary: No acute abnormality. Pancreas: Unremarkable. Spleen: Unremarkable. Adrenals/Urinary Tract: Normal adrenal glands. No urinary calculi or hydronephrosis. Unremarkable bladder. Stomach/Bowel: Stomach is within normal limits. No bowel wall thickening or bowel obstruction. The appendix is not definitively seen. No secondary signs of appendicitis. Colonic diverticulosis without diverticulitis. Vascular/Lymphatic: Aortic atherosclerosis. No enlarged abdominal or pelvic lymph nodes. Reproductive: Thickened peripherally enhancing endometrium. No adnexal mass. Other: No free intraperitoneal fluid or air. Musculoskeletal: Demineralization.  No acute fracture. Review of the MIP images confirms the above findings. IMPRESSION: 1.  Nonocclusive segmental pulmonary emboli in the right middle lobe and left lower lobe pulmonary arteries. No definite right heart strain though evaluation is limited by marked cardiomegaly. 2. No acute abnormality in the abdomen or pelvis. 3. Thickened peripherally enhancing endometrium. If clinically appropriate given comorbidities and life expectancy, consider nonemergent pelvic  ultrasound for further evaluation. 4.  Aortic Atherosclerosis (ICD10-I70.0). Critical Value/emergent results were called by telephone at the time of interpretation on 02/10/2023 at 1:29 am to provider [facility] , who verbally acknowledged these results. Electronically Signed   By: Norman Gatlin M.D.   On: 02/10/2023 01:30     Subjective: Patient seen examined bedside, resting calmly.  Lying in bed.  Daughter present.  Remains pleasantly confused, no complaints this morning.  Hemoglobin stable 7.7 from 7.8 yesterday.  Discussed with daughter reasonable to continue anticoagulation at this time with close monitoring of her vaginal bleeding and repeat CBC 1 week.  Ready for discharge back to [facility].  Denies headache, no chest pain, no shortness of breath, no abdominal pain, no fever.  No acute events overnight per nursing staff.  Discharge Exam: Vitals:   02/11/23 2159 02/12/23 0400  BP: 136/62 (!) 151/66  Pulse: 74 73  Resp: 20 18  Temp: 98.5 F (36.9 C) 98 F (36.7 C)  SpO2: 95% 93%   Vitals:   02/11/23 0624 02/11/23 1346 02/11/23 2159 02/12/23 0400  BP: (!) 126/54 (!) 136/55 136/62 (!) 151/66  Pulse: 70 70 74 73  Resp: 19 17 20 18   Temp: 98.3 F (36.8 C) 98.1 F (36.7 C) 98.5 F (36.9 C) 98 F (36.7 C)  TempSrc: Oral  Oral   SpO2: 94% 96% 95% 93%  Weight:      Height:        Physical Exam: GEN: NAD, alert, pleasantly confused, elderly in appearance, + HOH HEENT: NCAT, PERRL, EOMI, sclera clear, MMM PULM: CTAB w/o wheezes/crackles, normal respiratory effort, on room air CV: RRR w/o M/G/R GI: abd soft, NTND, + BS MSK: no peripheral edema, moves all extremities independently NEURO: No focal neurological deficit PSYCH: normal mood/affect Integumentary: No concerning rashes/lesions/wounds noted on exposed skin surfaces.    The results of significant diagnostics from this hospitalization (including imaging, microbiology, ancillary and laboratory) are listed below for  reference.     Microbiology: No results found for this or any previous visit (from the past 240 hours).   Labs: BNP (last 3 results) No results for input(s): BNP in the last 8760 hours. Basic Metabolic Panel: Recent Labs  Lab 02/09/23 2349 02/10/23 0755 02/11/23 0414  NA 137  --  136  K 3.7  --  3.8  CL 99  --  102  CO2 27  --  25  GLUCOSE 123*  --  88  BUN 10  --  14  CREATININE 0.83  --  0.93  CALCIUM  9.0  --  8.3*  MG 2.0  --  2.0  PHOS  --  3.0  --    Liver Function Tests: Recent Labs  Lab 02/09/23 2349 02/11/23 0414  AST 17 15  ALT 9 9  ALKPHOS 88 75  BILITOT 0.7 0.7  PROT 6.5 5.7*  ALBUMIN 3.7 3.1*   Recent Labs  Lab 02/09/23 2349  LIPASE 90*   No results for input(s): AMMONIA in the last 168 hours. CBC: Recent Labs  Lab 02/09/23 2349 02/10/23 1706 02/11/23 0414 02/11/23 1440 02/11/23 2014 02/12/23 0338  WBC 10.8*  --  4.5  --   --  4.3  HGB 8.6* 7.8* 7.8* 7.9* 7.9* 7.7*  HCT 29.8* 26.0* 26.5* 26.8* 26.6* 27.2*  MCV 80.8  --  80.3  --   --  81.0  PLT 264  --  221  --   --  219   Cardiac Enzymes: No results for input(s): CKTOTAL, CKMB, CKMBINDEX, TROPONINI in the last 168 hours. BNP: Invalid input(s): POCBNP CBG: No results for input(s): GLUCAP in the last 168 hours. D-Dimer No results for input(s): DDIMER in the last 72 hours. Hgb A1c No results for input(s): HGBA1C in the last 72 hours. Lipid Profile No results for input(s): CHOL, HDL, LDLCALC, TRIG, CHOLHDL, LDLDIRECT in the last 72 hours. Thyroid  function studies No results for input(s): TSH, T4TOTAL, T3FREE, THYROIDAB in the last 72 hours.  Invalid input(s): FREET3 Anemia work up No results for input(s): VITAMINB12, FOLATE, FERRITIN, TIBC, IRON, RETICCTPCT in the last 72 hours. Urinalysis    Component Value Date/Time   COLORURINE YELLOW 04/14/2020 1311   APPEARANCEUR CLEAR 04/14/2020 1311   LABSPEC 1.025 04/14/2020 1311    PHURINE 5.5 04/14/2020 1311   GLUCOSEU NEGATIVE 04/14/2020 1311   HGBUR NEGATIVE 04/14/2020 1311   BILIRUBINUR NEGATIVE 04/14/2020 1311   KETONESUR NEGATIVE 04/14/2020 1311   PROTEINUR NEGATIVE 04/14/2020 1311   NITRITE POSITIVE (A) 04/14/2020 1311   LEUKOCYTESUR NEGATIVE 04/14/2020 1311   Sepsis Labs Recent Labs  Lab 02/09/23 2349 02/11/23 0414 02/12/23 0338  WBC 10.8* 4.5 4.3   Microbiology No results found for this or any previous visit (from the past 240 hours).   Time coordinating discharge: Over 30 minutes  SIGNED:   Camellia PARAS Toma Erichsen, DO  [facility] 02/12/2023, 10:38 AM

## 2023-02-12 NOTE — TOC Transition Note (Signed)
 Transition of Care [facility]) - Discharge Note   Patient Details  Name: Renee Cameron MRN: [id number] Date of Birth: 09-13-1924  Transition of [facility]) CM/SW Contact:  Rollo Petri, LCSW Phone Number: 02/12/2023, 9:14 AM   Clinical Narrative:     Pt medically stable for dc today per MD. Updated Ros[facility] at [place].   DC clinical sent electronically. HH arranged with [facility] [employer] request.  Spoke with pt's daughter and she will transport pt at dc.  No other TOC needs.  Final next level of care: Assisted Living Barriers to Discharge: Barriers Resolved   Patient Goals and CMS Choice Patient states their goals for this hospitalization and ongoing recovery are:: return to ALF   Choice offered to / list presented to : Adult Children [place] ownership interest in [facility].provided to::  (n/a)    Discharge Placement                       Discharge Plan and Services Additional resources added to the After Visit Summary for   In-house Referral: Clinical Social Work   Post Acute Care Choice: Resumption of Svcs/PTA Provider                    HH Arranged: RN, PT [facility] Agency: [facility] Date [facility] Agency Contacted: 02/12/23   Representative spoke with at [facility] Agency: Lauraine  Social Drivers of Health (SDOH) Interventions SDOH Screenings   Food Insecurity: No Food Insecurity (02/10/2023)  Housing: Patient Declined (02/11/2023)  Transportation Needs: No Transportation Needs (02/10/2023)  Utilities: Not At Risk (02/10/2023)  Depression (PHQ2-9): Low Risk  (10/10/2022)  Social Connections: Moderately Integrated (02/10/2023)  Tobacco Use: Low Risk  (02/09/2023)     Readmission Risk Interventions     No data to display

## 2023-02-12 NOTE — Plan of Care (Signed)
 Patient ID: Renee Cameron, female   DOB: 03/14/1924, 89 y.o.   MRN: [id number]  Problem: Education: Goal: Knowledge of General Education information will improve Description: Including pain rating scale, medication(s)/side effects and non-pharmacologic comfort measures Outcome: Adequate for Discharge   Problem: Health Behavior/Discharge Planning: Goal: Ability to manage health-related needs will improve Outcome: Adequate for Discharge   Problem: Clinical Measurements: Goal: Ability to maintain clinical measurements within normal limits will improve Outcome: Adequate for Discharge Goal: Will remain free from infection Outcome: Adequate for Discharge Goal: Diagnostic test results will improve Outcome: Adequate for Discharge Goal: Respiratory complications will improve Outcome: Adequate for Discharge Goal: Cardiovascular complication will be avoided Outcome: Adequate for Discharge   Problem: Activity: Goal: Risk for activity intolerance will decrease Outcome: Adequate for Discharge   Problem: Nutrition: Goal: Adequate nutrition will be maintained Outcome: Adequate for Discharge   Problem: Coping: Goal: Level of anxiety will decrease Outcome: Adequate for Discharge   Problem: Elimination: Goal: Will not experience complications related to bowel motility Outcome: Adequate for Discharge Goal: Will not experience complications related to urinary retention Outcome: Adequate for Discharge   Problem: Pain Managment: Goal: General experience of comfort will improve and/or be controlled Outcome: Adequate for Discharge   Problem: Safety: Goal: Ability to remain free from injury will improve Outcome: Adequate for Discharge   Problem: Skin Integrity: Goal: Risk for impaired skin integrity will decrease Outcome: Adequate for Discharge     Verdie JONETTA Collier, RN

## 2023-02-12 NOTE — NC FL2 (Deleted)
 [place]  MEDICAID [id number] LEVEL OF CARE FORM     IDENTIFICATION  Patient Name: Renee Cameron Birthdate: Sep 21, 1924 Sex: female Admission Date (Current Location): 02/09/2023  [facility] and [place] Number:  [employer] and Address:  [facility],  [address] [place] [address]      Provider Number: [id number]  Attending Physician Name and Address:  [place], Camellia PARAS, DO  Relative Name and Phone Number:       Current Level of Care: Hospital Recommended Level of Care: Assisted Living Facility Prior Approval Number:    Date Approved/Denied:   PASRR Number:    Discharge Plan: Other (Comment) (ALF)    Current Diagnoses: Patient Active Problem List   Diagnosis Date Noted   Pulmonary embolism (HCC) 02/10/2023   Mixed hyperlipidemia 02/10/2023   GERD (gastroesophageal reflux disease) 02/10/2023   Malignant neoplasm of endometrium (HCC) 10/16/2022   Abnormal thyroid  function test 06/01/2017   Complete heart block (HCC) 05/29/2017   Elevated troponin 04/26/2016   Elevated lipase 04/26/2016   Dementia without behavioral disturbance (HCC) 04/26/2016   Abdominal pain in female    Diarrhea    Rectus sheath hematoma    Acute blood loss anemia 03/14/2014   Bruise, trunk 03/14/2014   Vomiting in adult 03/11/2014   CAP (community acquired pneumonia) 03/11/2014   Hyponatremia 03/11/2014   Nausea    Encounter for therapeutic drug monitoring 02/05/2013   Permanent atrial fibrillation (HCC)    Hyperthyroidism    Essential hypertension    Chronic anticoagulation     Orientation RESPIRATION BLADDER Height & Weight     Self  Normal Continent Weight: 134 lb 11.2 oz (61.1 kg) Height:  5' 4 (162.6 cm)  BEHAVIORAL SYMPTOMS/MOOD NEUROLOGICAL BOWEL NUTRITION STATUS      Continent Diet (No added salt)  AMBULATORY STATUS COMMUNICATION OF NEEDS Skin   Independent Verbally Normal                       Personal Care Assistance Level of Assistance  Bathing,  Feeding, Dressing Bathing Assistance: Independent Feeding assistance: Independent Dressing Assistance: Independent     Functional Limitations Info  Sight, Hearing, Speech Sight Info: Adequate Hearing Info: Adequate Speech Info: Adequate    SPECIAL CARE FACTORS FREQUENCY                       Contractures Contractures Info: Not present    Additional Factors Info  Code Status, Allergies Code Status Info: DNR Allergies Info: NKA           Current Medications (02/12/2023):  This is the current hospital active medication list Current Facility-Administered Medications  Medication Dose Route Frequency Provider Last Rate Last Admin   acetaminophen  (TYLENOL ) tablet 650 mg  650 mg Oral Q6H PRN Adefeso, Oladapo, DO   650 mg at 02/10/23 1450   Or   acetaminophen  (TYLENOL ) suppository 650 mg  650 mg Rectal Q6H PRN Adefeso, Oladapo, DO       ALPRAZolam  (XANAX ) tablet 0.25 mg  0.25 mg Oral BID PRN [place], Eric J, DO   0.25 mg at 02/11/23 2132   apixaban  (ELIQUIS ) tablet 10 mg  10 mg Oral BID [place], Eric J, DO   10 mg at 02/11/23 2131   Followed by   NOREEN ON 02/18/2023] apixaban  (ELIQUIS ) tablet 5 mg  5 mg Oral BID [place], Eric J, DO       atorvastatin  (LIPITOR) tablet 40 mg  40 mg Oral Daily Adefeso, Oladapo, DO   40 mg at 02/11/23 0850   donepezil  (ARICEPT ) tablet 5 mg  5 mg Oral QHS Adefeso, Oladapo, DO   5 mg at 02/11/23 2131   famotidine  (PEPCID ) tablet 40 mg  40 mg Oral QHS Adefeso, Oladapo, DO   40 mg at 02/11/23 2131   losartan  (COZAAR ) tablet 25 mg  25 mg Oral Daily Adefeso, Oladapo, DO   25 mg at 02/11/23 0850   memantine  (NAMENDA ) tablet 10 mg  10 mg Oral Daily Adefeso, Oladapo, DO   10 mg at 02/11/23 0850   metoprolol  succinate (TOPROL -XL) 24 hr tablet 12.5 mg  12.5 mg Oral Daily Adefeso, Oladapo, DO   12.5 mg at 02/11/23 0851   ondansetron  (ZOFRAN ) injection 4 mg  4 mg Intravenous Q6H PRN Adefeso, Oladapo, DO   4 mg at 02/10/23 1257   prochlorperazine  (COMPAZINE )  injection 10 mg  10 mg Intravenous Q6H PRN [place], Eric J, DO       venlafaxine  XR (EFFEXOR -XR) 24 hr capsule 75 mg  75 mg Oral Q breakfast [place], Eric J, DO   75 mg at 02/11/23 9149     Discharge Medications:  TAKE these medications     acetaminophen  325 MG tablet Commonly known as: Tylenol  Take 2 tablets (650 mg total) by mouth every 6 (six) hours as needed.    ALPRAZolam  0.25 MG tablet Commonly known as: XANAX  Take 1 tablet (0.25 mg total) by mouth 2 (two) times daily as needed.    Apixaban  Starter Pack (10mg  and 5mg ) Commonly known as: ELIQUIS  STARTER PACK Take as directed on package: start with two-5mg  tablets twice daily for 7 days. On day 8, switch to one-5mg  tablet twice daily.    atorvastatin  40 MG tablet Commonly known as: LIPITOR Take 40 mg by mouth daily.    Cholecalciferol 50 MCG (2000 UT) Caps Take 1 capsule by mouth daily.    cyanocobalamin 500 MCG tablet Commonly known as: VITAMIN B12 Take 500 mcg by mouth every other day. Take 1 tablet by mouth every Monday Wednesday and Friday    desvenlafaxine  50 MG 24 hr tablet Commonly known as: PRISTIQ  Take 1 tablet by mouth daily.    donepezil  5 MG tablet Commonly known as: ARICEPT  Take 5 mg by mouth at bedtime.    eucerin lotion Apply 1 Application topically in the morning and at bedtime. Eczema relief 1% Apply to entire body    famotidine  40 MG tablet Commonly known as: PEPCID  Take 40 mg by mouth daily.    ferrous sulfate 325 (65 FE) MG tablet Take 325 mg by mouth 2 (two) times daily with a meal.    furosemide  20 MG tablet Commonly known as: LASIX  Take 20 mg by mouth daily.    losartan  25 MG tablet Commonly known as: COZAAR  Take 25 mg by mouth daily.    memantine  10 MG tablet Commonly known as: NAMENDA  Take 1 tablet by mouth daily.    metoprolol  succinate 12.5 mg Tb24 24 hr tablet Commonly known as: TOPROL -XL Take 12.5 mg by mouth daily.    potassium chloride  10 MEQ tablet Commonly known  as: KLOR-CON  M Take 1 tablet by mouth daily.    PreserVision AREDS 2+Multi Vit Caps Take 2 capsules by mouth daily.    REFRESH OP Apply 1 drop to eye in the morning, at noon, and at bedtime. 1 drop in both eyes 3 times a day       Relevant Imaging Results:  Relevant Lab Results:   Additional Information [facility] RN PT ordered  Rollo Petri, LCSW

## 2023-02-12 NOTE — Progress Notes (Signed)
 Discharge instructions reviewed with patient and daughter at bedside. Reviewed medication list with times meds are due, noted on AVS for ALF. Reviewed attached discharge information for post PE care and signs of when to seek medical care and/or monitoring for bleeding/bruising while on Eliquis . Verbalized understanding. IV site removed, site within normal limits. Daughter assisting to get her dressed, will accompany out via w/c once dressed. Discharging back to [facility] ALF.

## 2023-02-12 NOTE — Progress Notes (Signed)
 [facility] Liaison Note  This patient is active with [facility] outpatient palliative program.  We will follow for discharge disposition.  Please call if you have any outpatient palliative or hospice questions.  Thank you, Randine Nail, BSN, [facility] [phone]

## 2023-02-12 NOTE — NC FL2 (Signed)
 [place]  MEDICAID FL2 LEVEL OF CARE FORM     IDENTIFICATION  Patient Name: Renee Cameron Birthdate: 1924-08-28 Sex: female Admission Date (Current Location): 02/09/2023  [facility] and [place] Number:  [employer] and Address:  [facility],  [address], [place] [address]      Provider Number: [id number]  Attending Physician Name and Address:  [place], Camellia PARAS, DO  Relative Name and Phone Number:       Current Level of Care: Hospital Recommended Level of Care: Assisted Living Facility Prior Approval Number:    Date Approved/Denied:   PASRR Number:    Discharge Plan: Other (Comment) (ALF)    Current Diagnoses: Patient Active Problem List   Diagnosis Date Noted   Pulmonary embolism (HCC) 02/10/2023   Mixed hyperlipidemia 02/10/2023   GERD (gastroesophageal reflux disease) 02/10/2023   Malignant neoplasm of endometrium (HCC) 10/16/2022   Abnormal thyroid  function test 06/01/2017   Complete heart block (HCC) 05/29/2017   Elevated troponin 04/26/2016   Elevated lipase 04/26/2016   Dementia without behavioral disturbance (HCC) 04/26/2016   Abdominal pain in female    Diarrhea    Rectus sheath hematoma    Acute blood loss anemia 03/14/2014   Bruise, trunk 03/14/2014   Vomiting in adult 03/11/2014   CAP (community acquired pneumonia) 03/11/2014   Hyponatremia 03/11/2014   Nausea    Encounter for therapeutic drug monitoring 02/05/2013   Permanent atrial fibrillation (HCC)    Hyperthyroidism    Essential hypertension    Chronic anticoagulation     Orientation RESPIRATION BLADDER Height & Weight     Self  Normal Continent Weight: 134 lb 11.2 oz (61.1 kg) Height:  5' 4 (162.6 cm)  BEHAVIORAL SYMPTOMS/MOOD NEUROLOGICAL BOWEL NUTRITION STATUS      Continent Diet (No added salt)  AMBULATORY STATUS COMMUNICATION OF NEEDS Skin   Independent Verbally Normal                       Personal Care Assistance Level of Assistance  Bathing,  Feeding, Dressing Bathing Assistance: Independent Feeding assistance: Independent Dressing Assistance: Independent     Functional Limitations Info  Sight, Hearing, Speech Sight Info: Adequate Hearing Info: Adequate Speech Info: Adequate    SPECIAL CARE FACTORS FREQUENCY                       Contractures Contractures Info: Not present    Additional Factors Info  Code Status, Allergies Code Status Info: DNR Allergies Info: NKA           Current Medications (02/12/2023):  This is the current hospital active medication list Current Facility-Administered Medications  Medication Dose Route Frequency Provider Last Rate Last Admin   acetaminophen  (TYLENOL ) tablet 650 mg  650 mg Oral Q6H PRN Adefeso, Oladapo, DO   650 mg at 02/10/23 1450   Or   acetaminophen  (TYLENOL ) suppository 650 mg  650 mg Rectal Q6H PRN Adefeso, Oladapo, DO       ALPRAZolam  (XANAX ) tablet 0.25 mg  0.25 mg Oral BID PRN [place], Camellia PARAS, DO   0.25 mg at 02/11/23 2132   apixaban  (ELIQUIS ) tablet 10 mg  10 mg Oral BID [place], Eric J, DO   10 mg at 02/11/23 2131   Followed by   NOREEN ON 02/18/2023] apixaban  (ELIQUIS ) tablet 5 mg  5 mg Oral BID [place], Eric J, DO       atorvastatin  (LIPITOR) tablet 40 mg  40 mg Oral Daily Adefeso, Oladapo, DO   40 mg at 02/11/23 0850   donepezil  (ARICEPT ) tablet 5 mg  5 mg Oral QHS Adefeso, Oladapo, DO   5 mg at 02/11/23 2131   famotidine  (PEPCID ) tablet 40 mg  40 mg Oral QHS Adefeso, Oladapo, DO   40 mg at 02/11/23 2131   losartan  (COZAAR ) tablet 25 mg  25 mg Oral Daily Adefeso, Oladapo, DO   25 mg at 02/11/23 0850   memantine  (NAMENDA ) tablet 10 mg  10 mg Oral Daily Adefeso, Oladapo, DO   10 mg at 02/11/23 0850   metoprolol  succinate (TOPROL -XL) 24 hr tablet 12.5 mg  12.5 mg Oral Daily Adefeso, Oladapo, DO   12.5 mg at 02/11/23 0851   ondansetron  (ZOFRAN ) injection 4 mg  4 mg Intravenous Q6H PRN Adefeso, Oladapo, DO   4 mg at 02/10/23 1257   prochlorperazine  (COMPAZINE )  injection 10 mg  10 mg Intravenous Q6H PRN [place], Eric J, DO       venlafaxine  XR (EFFEXOR -XR) 24 hr capsule 75 mg  75 mg Oral Q breakfast [place], Eric J, DO   75 mg at 02/11/23 9149     Discharge Medications:  TAKE these medications     acetaminophen  325 MG tablet Commonly known as: Tylenol  Take 2 tablets (650 mg total) by mouth every 6 (six) hours as needed.    ALPRAZolam  0.25 MG tablet Commonly known as: XANAX  Take 1 tablet (0.25 mg total) by mouth 2 (two) times daily. What changed:  when to take this reasons to take this    Apixaban  Starter Pack (10mg  and 5mg ) Commonly known as: ELIQUIS  STARTER PACK Take as directed on package: start with two-5mg  tablets twice daily for 7 days. On day 8, switch to one-5mg  tablet twice daily.    atorvastatin  40 MG tablet Commonly known as: LIPITOR Take 40 mg by mouth daily.    Cholecalciferol 50 MCG (2000 UT) Caps Take 1 capsule by mouth daily.    cyanocobalamin 500 MCG tablet Commonly known as: VITAMIN B12 Take 500 mcg by mouth every other day. Take 1 tablet by mouth every Monday Wednesday and Friday    desvenlafaxine  50 MG 24 hr tablet Commonly known as: PRISTIQ  Take 1 tablet by mouth daily.    donepezil  5 MG tablet Commonly known as: ARICEPT  Take 5 mg by mouth at bedtime.    eucerin lotion Apply 1 Application topically in the morning and at bedtime. Eczema relief 1% Apply to entire body    famotidine  40 MG tablet Commonly known as: PEPCID  Take 40 mg by mouth daily.    ferrous sulfate 325 (65 FE) MG tablet Take 325 mg by mouth 2 (two) times daily with a meal.    furosemide  20 MG tablet Commonly known as: LASIX  Take 20 mg by mouth daily.    losartan  25 MG tablet Commonly known as: COZAAR  Take 25 mg by mouth daily.    memantine  10 MG tablet Commonly known as: NAMENDA  Take 1 tablet by mouth daily.    metoprolol  succinate 12.5 mg Tb24 24 hr tablet Commonly known as: TOPROL -XL Take 12.5 mg by mouth daily.     potassium chloride  10 MEQ tablet Commonly known as: KLOR-CON  M Take 1 tablet by mouth daily.    PreserVision AREDS 2+Multi Vit Caps Take 2 capsules by mouth daily.    REFRESH OP Apply 1 drop to eye in the morning, at noon, and at bedtime. 1 drop in both eyes 3 times a day  Relevant Imaging Results:  Relevant Lab Results:   Additional Information [facility] RN PT ordered  Rollo Petri, LCSW

## 2023-02-22 ENCOUNTER — Emergency Department (HOSPITAL_COMMUNITY)
Admission: EM | Admit: 2023-02-22 | Discharge: 2023-02-22 | Disposition: A | Discharge status: Home / Self Care | Payer: Medicare Other | Attending: Emergency Medicine | Admitting: Emergency Medicine

## 2023-02-22 ENCOUNTER — Other Ambulatory Visit: Payer: Self-pay

## 2023-02-22 ENCOUNTER — Emergency Department (HOSPITAL_COMMUNITY): Payer: Medicare Other

## 2023-02-22 ENCOUNTER — Encounter (HOSPITAL_COMMUNITY): Payer: Self-pay

## 2023-02-22 DIAGNOSIS — I10 Essential (primary) hypertension: Secondary | ICD-10-CM | POA: Diagnosis not present

## 2023-02-22 DIAGNOSIS — Z7901 Long term (current) use of anticoagulants: Secondary | ICD-10-CM | POA: Diagnosis not present

## 2023-02-22 DIAGNOSIS — Z8542 Personal history of malignant neoplasm of other parts of uterus: Secondary | ICD-10-CM | POA: Insufficient documentation

## 2023-02-22 DIAGNOSIS — F0390 Unspecified dementia without behavioral disturbance: Secondary | ICD-10-CM | POA: Insufficient documentation

## 2023-02-22 DIAGNOSIS — S0990XA Unspecified injury of head, initial encounter: Secondary | ICD-10-CM | POA: Diagnosis present

## 2023-02-22 DIAGNOSIS — Y92019 Unspecified place in single-family (private) house as the place of occurrence of the external cause: Secondary | ICD-10-CM | POA: Diagnosis not present

## 2023-02-22 DIAGNOSIS — W19XXXA Unspecified fall, initial encounter: Secondary | ICD-10-CM

## 2023-02-22 DIAGNOSIS — Z79899 Other long term (current) drug therapy: Secondary | ICD-10-CM | POA: Diagnosis not present

## 2023-02-22 DIAGNOSIS — W182XXA Fall in (into) shower or empty bathtub, initial encounter: Secondary | ICD-10-CM | POA: Insufficient documentation

## 2023-02-22 NOTE — Discharge Instructions (Signed)
Thank you for coming to [facility] Emergency Department. You were seen for fall at home and head injury. We did an exam, and imaging, and these showed no acute findings.  Please follow up with your primary care provider within 1 week.   Do not hesitate to return to the ED or call 911 if you experience: -Worsening symptoms -Lightheadedness, passing out -Fevers/chills -Anything else that concerns you

## 2023-02-22 NOTE — ED Provider Notes (Signed)
EMERGENCY DEPARTMENT AT [facility] Provider Note   CSN: [id number] Arrival date & time: 02/22/23  1620     History  Chief Complaint  Patient presents with   Renee Cameron is a 89 y.o. female with dementia, endometrial cancer (patient unaware of this diagnosis per daughter), CHB s/p STJ PPM 2019, Afib, on eliquis, HTN, hyperthyroidism, anxiety who presents with fall in shower. Patient lives at [facility] and slipped in the shower today, hitting the back of her head. Patient doesn't remember the fall but states she doesn't have any pain anywhere. Denies head or neck pain and states she feels well.   Past Medical History:  Diagnosis Date   Anxiety    Atrial fibrillation (HCC)    Chronic anticoagulation    Complete heart block (HCC)    a. s/p STJ PPM 05/2017.   Dementia [facility])    Depression    Essential hypertension    Hyperthyroidism 2000   2000   Macular degeneration    Osteoporosis    Presence of permanent cardiac pacemaker        Home Medications Prior to Admission medications   Medication Sig Start Date End Date Taking? Authorizing Provider  acetaminophen (TYLENOL) 325 MG tablet Take 2 tablets (650 mg total) by mouth every 6 (six) hours as needed. 03/07/22   Cristopher Peru, PA-C  ALPRAZolam Prudy Feeler) 0.25 MG tablet Take 1 tablet (0.25 mg total) by mouth 2 (two) times daily. 02/12/23 03/14/23  [place], Alvira Philips, DO  APIXABAN Everlene Balls) VTE STARTER PACK (10MG  AND 5MG ) Take as directed on package: start with two-5mg  tablets twice daily for 7 days. On day 8, switch to one-5mg  tablet twice daily. 02/12/23   [place], Eric J, DO  atorvastatin (LIPITOR) 40 MG tablet Take 40 mg by mouth daily.    [provider]  Cholecalciferol 50 MCG (2000 UT) CAPS Take 1 capsule by mouth daily.    [provider]  cyanocobalamin (VITAMIN B12) 500 MCG tablet Take 500 mcg by mouth every other day. Take 1 tablet by mouth every Monday Wednesday  and Friday    [provider]  desvenlafaxine (PRISTIQ) 50 MG 24 hr tablet Take 1 tablet by mouth daily. 05/18/17   [provider]  donepezil (ARICEPT) 5 MG tablet Take 5 mg by mouth at bedtime. 04/09/17   [provider]  Emollient (EUCERIN) lotion Apply 1 Application topically in the morning and at bedtime. Eczema relief 1% Apply to entire body    [provider]  famotidine (PEPCID) 40 MG tablet Take 40 mg by mouth daily.    [provider]  ferrous sulfate 325 (65 FE) MG tablet Take 325 mg by mouth 2 (two) times daily with a meal.    [provider]  furosemide (LASIX) 20 MG tablet Take 20 mg by mouth daily.    [provider]  losartan (COZAAR) 25 MG tablet Take 25 mg by mouth daily. 07/21/20   [provider]  memantine (NAMENDA) 10 MG tablet Take 1 tablet by mouth daily. 04/20/17   [provider]  metoprolol succinate (TOPROL-XL) 12.5 mg TB24 24 hr tablet Take 12.5 mg by mouth daily.    [provider]  Multiple Vitamins-Minerals (PRESERVISION AREDS 2+MULTI VIT) CAPS Take 2 capsules by mouth daily.    [provider]  Polyvinyl Alcohol-Povidone (REFRESH OP) Apply 1 drop to eye in the morning, at noon, and at bedtime. 1 drop  in both eyes 3 times a day    [provider]  potassium chloride (K-DUR,KLOR-CON) 10 MEQ tablet Take 1 tablet by mouth daily. 09/07/17   [provider]      Allergies    Patient has no known allergies.    Review of Systems   Review of Systems A 10 point review of systems was performed and is negative unless otherwise reported in HPI.  Physical Exam Updated Vital Signs BP (!) 145/80 (BP Location: Left Arm)   Pulse 72   Temp 97.9 F (36.6 C) (Oral)   Resp 17   SpO2 97%  Physical Exam  PRIMARY SURVEY  Airway Airway intact  Breathing Bilateral breath sounds  Circulation Carotid/femoral pulses 2+ intact bilaterally  GCS E =  4 V =  4 M =   6 Total = 14  Environment All clothes removed      SECONDARY SURVEY  Gen: -NAD  HEENT: -Head: NCAT. Scalp is clear of lacerations or wounds. Skull is clear of deformities or depressions -Forehead: Normal -Midface: Stable -Eyes: No visible injury to eyelids or eye, PERRL, EOMI -Nose: No gross deformities, no septal hematoma -Mouth: No injuries to lips, tongue or teeth. No trismus or malposition -Ears: No auricular hematoma -Neck: Trachea is midline, no distended neck veins  Chest: -No tenderness, deformities, bruising or crepitus to clavicles or chest -Normal chest expansion -Normal heart sounds, S1/S2 normal, no m/r/g -No wheezes, rales, rhonchi  Abdomen: -No tenderness, bruising or penetrating injury  Pelvis: -Pelvis is stable and non-tender  Extremities: Right Upper Extremity: -No point tenderness, deformity or other signs of injury -Radial pulse intact RUE, cap refill good -Normal sensation -Normal ROM, good strength Left Upper Extremity: -No point tenderness, deformity or other signs of injury -Radial pulse intact LUE, cap refill good -Normal sensation -Normal ROM, good strength Right Lower Extremity: -No point tenderness, deformity or other signs of injury -DP intact RLE -Normal sensation -Normal ROM, good strength Left Lower Extremity: -No point tenderness, deformity or other signs of injury -DP intact LLE -Normal sensation -Normal ROM, good strength  Back/Spine: -No midline C, T or L spine tenderness or step-offs  Other: N/A     ED Results / Procedures / Treatments   Labs (all labs ordered are listed, but only abnormal results are displayed) Labs Reviewed - No data to display  EKG None  Radiology No results found.  Procedures Procedures    Medications Ordered in ED Medications - No data to display  ED Course/ Medical Decision Making/ A&P                          Medical Decision Making Amount and/or Complexity of Data Reviewed Radiology:  ordered. Decision-making details documented in ED Course.    This patient presents to the ED for concern of fall, this involves an extensive number of treatment options, and is a complaint that carries with it a high risk of complications and morbidity.  I considered the following differential and admission for this acute, potentially life threatening condition. Patient overall well-appearing, HDS.  MDM:    DDX for trauma includes but is not limited to:  -CTH reassuringly neg for ICH or skull fx.  -CT C-spine reassuringly neg for vertebral injury. Pt with no FNDs.  -No e/o rib  or extremity fractures on exam Patient states she's otherwise been feeling well and in her NSOH. Just experienced a mechanical fall in the shower. Shared decision  making with patient and her daughter about labs who agree that they are not needed today. Patient states she is ready to be DC'd. Advised f/u with PCP whtin 1-2 weeks as needed. DC w/ discharge instructions/return precautions. All questions answered to patient's satisfaction.    Clinical Course as of 02/26/23 2116  Fri Feb 22, 2023  1949 CT Head Wo Contrast No acute or traumatic finding. Age related volume loss. Chronic small-vessel ischemic changes of the cerebral hemispheric white matter.   [HN]  1949 CT Cervical Spine Wo Contrast No acute or traumatic finding. Chronic degenerative changes as outlined above. No change since the prior study.   [HN]    Clinical Course User Index [HN] Loetta Rough, MD    Imaging Studies ordered: I ordered imaging studies including those listed above I independently visualized and interpreted imaging. I agree with the radiologist interpretation  Additional history obtained from chart review, daughter at bedside.    Reevaluation: After the interventions noted above, I reevaluated the patient and found that they have :stayed the same  Social Determinants of Health: Lives at facility  Disposition:   DC  Co morbidities that complicate the patient evaluation  Past Medical History:  Diagnosis Date   Anxiety    Atrial fibrillation (HCC)    Chronic anticoagulation    Complete heart block (HCC)    a. s/p STJ PPM 05/2017.   Dementia [facility])    Depression    Essential hypertension    Hyperthyroidism 2000   2000   Macular degeneration    Osteoporosis    Presence of permanent cardiac pacemaker      Medicines No orders of the defined types were placed in this encounter.   I have reviewed the patients home medicines and have made adjustments as needed  Problem List / ED Course: Problem List Items Addressed This Visit   None Visit Diagnoses       Fall in home, initial encounter    -  Primary                   This note was created using dictation software, which may contain spelling or grammatical errors.    Loetta Rough, MD 02/26/23 2121

## 2023-02-22 NOTE — ED Triage Notes (Signed)
Pt bib [facility] d/t fall. Pt was getting out of shower and fell. Pt's aide states she hit the back of her head and is on blood thinners. Pt states she only hit her buttock. Pt denies pain or LOC

## 2023-03-13 ENCOUNTER — Ambulatory Visit (INDEPENDENT_AMBULATORY_CARE_PROVIDER_SITE_OTHER): Payer: Medicare Other

## 2023-03-13 DIAGNOSIS — I442 Atrioventricular block, complete: Secondary | ICD-10-CM | POA: Diagnosis not present

## 2023-03-18 LAB — CUP PACEART REMOTE DEVICE CHECK
Battery Remaining Longevity: 34 mo
Battery Remaining Percentage: 33 %
Battery Voltage: 2.96 V
Brady Statistic AP VP Percent: 99 %
Brady Statistic AP VS Percent: 1 %
Brady Statistic AS VP Percent: 1 %
Brady Statistic AS VS Percent: 1 %
Brady Statistic RA Percent Paced: 99 %
Brady Statistic RV Percent Paced: 99 %
Date Time Interrogation Session: 20250305020025
Implantable Lead Connection Status: 753985
Implantable Lead Connection Status: 753985
Implantable Lead Implant Date: 20190524
Implantable Lead Implant Date: 20190524
Implantable Lead Location: 753859
Implantable Lead Location: 753860
Implantable Pulse Generator Implant Date: 20190524
Lead Channel Impedance Value: 440 Ohm
Lead Channel Impedance Value: 440 Ohm
Lead Channel Pacing Threshold Amplitude: 0.5 V
Lead Channel Pacing Threshold Amplitude: 0.625 V
Lead Channel Pacing Threshold Pulse Width: 0.5 ms
Lead Channel Pacing Threshold Pulse Width: 0.5 ms
Lead Channel Sensing Intrinsic Amplitude: 1.2 mV
Lead Channel Sensing Intrinsic Amplitude: 12 mV
Lead Channel Setting Pacing Amplitude: 0.875
Lead Channel Setting Pacing Amplitude: 2 V
Lead Channel Setting Pacing Pulse Width: 0.5 ms
Lead Channel Setting Sensing Sensitivity: 2 mV
Pulse Gen Model: 2272
Pulse Gen Serial Number: 9022503

## 2023-03-19 ENCOUNTER — Encounter: Payer: Self-pay | Admitting: Internal Medicine

## 2023-03-22 ENCOUNTER — Other Ambulatory Visit: Payer: Self-pay

## 2023-03-22 ENCOUNTER — Emergency Department (HOSPITAL_COMMUNITY)
Admission: EM | Admit: 2023-03-22 | Discharge: 2023-03-23 | Disposition: A | Discharge status: Home / Self Care | Attending: Emergency Medicine | Admitting: Emergency Medicine

## 2023-03-22 ENCOUNTER — Encounter (HOSPITAL_COMMUNITY): Payer: Self-pay | Admitting: Emergency Medicine

## 2023-03-22 DIAGNOSIS — R1030 Lower abdominal pain, unspecified: Secondary | ICD-10-CM | POA: Diagnosis present

## 2023-03-22 DIAGNOSIS — Z8542 Personal history of malignant neoplasm of other parts of uterus: Secondary | ICD-10-CM | POA: Insufficient documentation

## 2023-03-22 DIAGNOSIS — Z7901 Long term (current) use of anticoagulants: Secondary | ICD-10-CM | POA: Diagnosis not present

## 2023-03-22 LAB — URINALYSIS, W/ REFLEX TO CULTURE (INFECTION SUSPECTED)
Bacteria, UA: NONE SEEN
Bilirubin Urine: NEGATIVE
Glucose, UA: NEGATIVE mg/dL
Hgb urine dipstick: NEGATIVE
Ketones, ur: NEGATIVE mg/dL
Leukocytes,Ua: NEGATIVE
Nitrite: NEGATIVE
Protein, ur: 30 mg/dL — AB
Specific Gravity, Urine: 1.025 (ref 1.005–1.030)
pH: 6 (ref 5.0–8.0)

## 2023-03-22 MED ORDER — OXYCODONE-ACETAMINOPHEN 5-325 MG PO TABS
1.0000 | ORAL_TABLET | Freq: Once | ORAL | Status: AC
  Administered 2023-03-22: 1 via ORAL
  Filled 2023-03-22: qty 1

## 2023-03-22 MED ORDER — OXYCODONE-ACETAMINOPHEN 5-325 MG PO TABS: 1.0000 | ORAL_TABLET | Freq: Three times a day (TID) | ORAL | 0 refills | Status: DC | PRN

## 2023-03-22 NOTE — ED Notes (Signed)
 This RN spoke with the pts POA daughter [name] and made her aware of the pts Dx and DC and that the pt is being transported back to [place].

## 2023-03-22 NOTE — ED Provider Notes (Signed)
 [place] EMERGENCY DEPARTMENT AT [facility] Provider Note   CSN: [id number] Arrival date & time: 03/22/23  2153     History  Chief Complaint  Patient presents with   Abdominal Pain    Renee Cameron is a 89 y.o. female.   Abdominal Pain  The patient's chief complaint is abdominal pain according to the paramedics and the nursing home report  The patient is a 89 year old female, she has a history of pulmonary embolism on Eliquis and a history of recently diagnosed uterine cancer for which she has elected not to undergo treatment.  She is currently at [facility] and has been complaining of some lower abdominal pain.  There has been no fevers vomiting dysuria or diarrhea, I have spoken with the daughter, [name], who reports that she does not want her mother to have a big workup in fact she would be okay with a urinalysis but no blood work and no CT scan.  She does not think that she would be a candidate for any interventions or surgery if there was something that needed to be fixed but does not want her to suffer from an infection so agrees to a urine sample.  The patient is pleasantly demented and when I asked her why she is here she states that her know when I ask her if she is doing okay she says yes, when I asked her if she hurts she points to her lower abdomen.    Home Medications Prior to Admission medications   Medication Sig Start Date End Date Taking? Authorizing Provider  oxyCODONE-acetaminophen (PERCOCET/ROXICET) 5-325 MG tablet Take 1 tablet by mouth every 8 (eight) hours as needed for severe pain (pain score 7-10). 03/22/23  Yes Eber Hong, MD  acetaminophen (TYLENOL) 325 MG tablet Take 2 tablets (650 mg total) by mouth every 6 (six) hours as needed. 03/07/22   Cristopher Peru, PA-C  APIXABAN Everlene Balls) VTE STARTER PACK (10MG  AND 5MG ) Take as directed on package: start with two-5mg  tablets twice daily for 7 days. On day 8, switch to one-5mg  tablet  twice daily. 02/12/23   [place], Eric J, DO  atorvastatin (LIPITOR) 40 MG tablet Take 40 mg by mouth daily.    [provider]  Cholecalciferol 50 MCG (2000 UT) CAPS Take 1 capsule by mouth daily.    [provider]  cyanocobalamin (VITAMIN B12) 500 MCG tablet Take 500 mcg by mouth every other day. Take 1 tablet by mouth every Monday Wednesday and Friday    [provider]  desvenlafaxine (PRISTIQ) 50 MG 24 hr tablet Take 1 tablet by mouth daily. 05/18/17   [provider]  donepezil (ARICEPT) 5 MG tablet Take 5 mg by mouth at bedtime. 04/09/17   [provider]  Emollient (EUCERIN) lotion Apply 1 Application topically in the morning and at bedtime. Eczema relief 1% Apply to entire body    [provider]  famotidine (PEPCID) 40 MG tablet Take 40 mg by mouth daily.    [provider]  ferrous sulfate 325 (65 FE) MG tablet Take 325 mg by mouth 2 (two) times daily with a meal.    [provider]  furosemide (LASIX) 20 MG tablet Take 20 mg by mouth daily.    [provider]  losartan (COZAAR) 25 MG tablet Take 25 mg by mouth daily. 07/21/20   [provider]  memantine (NAMENDA) 10 MG tablet Take 1 tablet by mouth daily. 04/20/17   [provider]  metoprolol succinate (TOPROL-XL) 12.5 mg TB24 24 hr tablet Take 12.5 mg by mouth daily.    [provider]  Multiple Vitamins-Minerals (PRESERVISION AREDS 2+MULTI VIT) CAPS Take 2 capsules by mouth daily.    [provider]  Polyvinyl Alcohol-Povidone (REFRESH OP) Apply 1 drop to eye in the morning, at noon, and at bedtime. 1 drop in both eyes 3 times a day    [provider]  potassium chloride (K-DUR,KLOR-CON) 10 MEQ tablet Take 1 tablet by mouth daily. 09/07/17   [provider]      Allergies    Patient has no known allergies.    Review of Systems   Review of Systems  Unable to perform ROS: Dementia  Gastrointestinal:   Positive for abdominal pain.    Physical Exam Updated Vital Signs BP (!) 148/76 (BP Location: Left Arm)   Pulse 70   Temp 98.4 F (36.9 C) (Oral)   Resp 16   SpO2 93%  Physical Exam Vitals and nursing note reviewed.  Constitutional:      General: She is not in acute distress.    Appearance: She is well-developed.  HENT:     Head: Normocephalic and atraumatic.     Mouth/Throat:     Pharynx: No oropharyngeal exudate.  Eyes:     General: No scleral icterus.       Right eye: No discharge.        Left eye: No discharge.     Conjunctiva/sclera: Conjunctivae normal.     Pupils: Pupils are equal, round, and reactive to light.  Neck:     Thyroid: No thyromegaly.     Vascular: No JVD.  Cardiovascular:     Rate and Rhythm: Normal rate and regular rhythm.     Heart sounds: Normal heart sounds. No murmur heard.    No friction rub. No gallop.  Pulmonary:     Effort: Pulmonary effort is normal. No respiratory distress.     Breath sounds: Normal breath sounds. No wheezing or rales.  Abdominal:     General: Bowel sounds are normal. There is no distension.     Palpations: Abdomen is soft. There is no mass.     Tenderness: There is abdominal tenderness.     Comments: There is mild tenderness to palpation in the mid lower abdomen, there is no guarding or peritoneal signs, no upper abdominal tenderness, no CVA tenderness, there is no rash on the abdominal wall  Musculoskeletal:        General: No tenderness. Normal range of motion.     Cervical back: Normal range of motion and neck supple.  Lymphadenopathy:     Cervical: No cervical adenopathy.  Skin:    General: Skin is warm and dry.     Findings: No erythema or rash.  Neurological:     Mental Status: She is alert.     Coordination: Coordination normal.     Comments: The patient is able to move all 4 extremities, she is awake alert and pleasant, she has significant memory deficits but is able to follow commands  Psychiatric:         Behavior: Behavior normal.     ED Results / Procedures / Treatments   Labs (all labs ordered are listed, but only abnormal results are displayed) Labs Reviewed  URINALYSIS, W/ REFLEX TO CULTURE (INFECTION SUSPECTED)    EKG None  Radiology No results found.  Procedures Procedures    Medications Ordered in ED Medications  oxyCODONE-acetaminophen (PERCOCET/ROXICET) 5-325 MG per tablet 1 tablet (1 tablet Oral Given 03/22/23 2303)    ED Course/ Medical Decision Making/ A&P                                 Medical Decision Making Risk Prescription drug management.   Ultimately this patient does not appear to be in distress, I think that she would benefit from a urinalysis only and when I discussed this with the daughter that is exactly what the daughter would like.  Nothing more, no imaging, no IVs, no blood draws.  This is very reasonable, she will be given some pain medicine here and prescribed for home.  They are in process of getting her onto hospice for her end-of-life uterine cancer.  At the time of shift care signed out to Dr. Durwin Nora to follow-up urinalysis results, patient has been given some pain medication, stable vital signs, anticipate discharge        Final Clinical Impression(s) / ED Diagnoses Final diagnoses:  Lower abdominal pain    Rx / DC Orders ED Discharge Orders          Ordered    oxyCODONE-acetaminophen (PERCOCET/ROXICET) 5-325 MG tablet  Every 8 hours PRN        03/22/23 2318              Eber Hong, MD 03/22/23 2319

## 2023-03-22 NOTE — ED Triage Notes (Signed)
 BIB EMS from [facility].  Pt has hx of stomach cancer.  Awaiting hospice orders.  Staff reports pt usually has a high pain tolerance and is controlled with tylenol.  Today, beginning this morning. Pt has had more pain that usual. No n/v/d or shob. Pt is alert and answers questions appropriately.

## 2023-03-22 NOTE — ED Notes (Signed)
 Pt is waiting on transport back to [place].

## 2023-03-22 NOTE — Discharge Instructions (Signed)
 The urine sample does not show any signs of infection I have spoken with the daughter and they do not wish for there to be any further testing or workup I have prescribed Percocet, 1 tablet every 8 hours as needed for severe pain.  You may want to try half a tablet first Please have the doctor at the facility see her within the next 48 hours Emergency department as needed for severe worsening symptoms if this is what the daughter [name]) wants you to do

## 2023-03-29 ENCOUNTER — Telehealth: Payer: Self-pay | Admitting: Cardiology

## 2023-03-29 NOTE — Telephone Encounter (Signed)
 Noted.

## 2023-03-29 NOTE — Telephone Encounter (Signed)
 Daughter Renee Cameron) called to report patient has entered hospice care and she has been advised to cancel futher appointments.

## 2023-04-02 ENCOUNTER — Encounter: Payer: No Typology Code available for payment source | Admitting: Internal Medicine

## 2023-04-30 NOTE — Progress Notes (Signed)
 Remote pacemaker transmission.

## 2023-04-30 NOTE — Addendum Note (Signed)
 Addended by: Lott Rouleau A on: 04/30/2023 02:17 PM   Modules accepted: Orders

## 2023-06-12 ENCOUNTER — Ambulatory Visit (INDEPENDENT_AMBULATORY_CARE_PROVIDER_SITE_OTHER): Payer: Medicare Other

## 2023-06-12 DIAGNOSIS — I442 Atrioventricular block, complete: Secondary | ICD-10-CM | POA: Diagnosis not present

## 2023-06-12 LAB — CUP PACEART REMOTE DEVICE CHECK
Battery Remaining Longevity: 30 mo
Battery Remaining Percentage: 31 %
Battery Voltage: 2.96 V
Brady Statistic AP VP Percent: 99 %
Brady Statistic AP VS Percent: 1 %
Brady Statistic AS VP Percent: 1 %
Brady Statistic AS VS Percent: 1 %
Brady Statistic RA Percent Paced: 99 %
Brady Statistic RV Percent Paced: 99 %
Date Time Interrogation Session: 20250604020013
Implantable Lead Connection Status: 753985
Implantable Lead Connection Status: 753985
Implantable Lead Implant Date: 20190524
Implantable Lead Implant Date: 20190524
Implantable Lead Location: 753859
Implantable Lead Location: 753860
Implantable Pulse Generator Implant Date: 20190524
Lead Channel Impedance Value: 430 Ohm
Lead Channel Impedance Value: 440 Ohm
Lead Channel Pacing Threshold Amplitude: 0.5 V
Lead Channel Pacing Threshold Amplitude: 0.625 V
Lead Channel Pacing Threshold Pulse Width: 0.5 ms
Lead Channel Pacing Threshold Pulse Width: 0.5 ms
Lead Channel Sensing Intrinsic Amplitude: 1.2 mV
Lead Channel Sensing Intrinsic Amplitude: 12 mV
Lead Channel Setting Pacing Amplitude: 0.875
Lead Channel Setting Pacing Amplitude: 2 V
Lead Channel Setting Pacing Pulse Width: 0.5 ms
Lead Channel Setting Sensing Sensitivity: 2 mV
Pulse Gen Model: 2272
Pulse Gen Serial Number: 9022503

## 2023-06-13 ENCOUNTER — Ambulatory Visit: Payer: Self-pay | Admitting: Internal Medicine

## 2023-06-24 ENCOUNTER — Ambulatory Visit: Payer: No Typology Code available for payment source | Admitting: Cardiology

## 2023-08-05 NOTE — Progress Notes (Signed)
 Remote pacemaker transmission.

## 2023-09-11 ENCOUNTER — Ambulatory Visit (INDEPENDENT_AMBULATORY_CARE_PROVIDER_SITE_OTHER): Payer: Medicare Other

## 2023-09-11 DIAGNOSIS — I442 Atrioventricular block, complete: Secondary | ICD-10-CM

## 2023-09-12 LAB — CUP PACEART REMOTE DEVICE CHECK
Battery Remaining Longevity: 28 mo
Battery Remaining Percentage: 28 %
Battery Voltage: 2.95 V
Brady Statistic AP VP Percent: 99 %
Brady Statistic AP VS Percent: 1 %
Brady Statistic AS VP Percent: 1 %
Brady Statistic AS VS Percent: 1 %
Brady Statistic RA Percent Paced: 99 %
Brady Statistic RV Percent Paced: 99 %
Date Time Interrogation Session: 20250903020013
Implantable Lead Connection Status: 753985
Implantable Lead Connection Status: 753985
Implantable Lead Implant Date: 20190524
Implantable Lead Implant Date: 20190524
Implantable Lead Location: 753859
Implantable Lead Location: 753860
Implantable Pulse Generator Implant Date: 20190524
Lead Channel Impedance Value: 440 Ohm
Lead Channel Impedance Value: 460 Ohm
Lead Channel Pacing Threshold Amplitude: 0.5 V
Lead Channel Pacing Threshold Amplitude: 0.75 V
Lead Channel Pacing Threshold Pulse Width: 0.5 ms
Lead Channel Pacing Threshold Pulse Width: 0.5 ms
Lead Channel Sensing Intrinsic Amplitude: 1.4 mV
Lead Channel Sensing Intrinsic Amplitude: 12 mV
Lead Channel Setting Pacing Amplitude: 1 V
Lead Channel Setting Pacing Amplitude: 2 V
Lead Channel Setting Pacing Pulse Width: 0.5 ms
Lead Channel Setting Sensing Sensitivity: 2 mV
Pulse Gen Model: 2272
Pulse Gen Serial Number: 9022503

## 2023-09-15 ENCOUNTER — Ambulatory Visit: Payer: Self-pay | Admitting: Internal Medicine

## 2023-09-18 NOTE — Progress Notes (Signed)
 Remote PPM Transmission

## 2023-12-09 DEATH — deceased

## 2023-12-11 ENCOUNTER — Ambulatory Visit: Payer: Medicare Other

## 2024-03-11 ENCOUNTER — Ambulatory Visit

## 2024-06-10 ENCOUNTER — Ambulatory Visit

## 2024-09-09 ENCOUNTER — Ambulatory Visit

## 2024-12-09 ENCOUNTER — Ambulatory Visit
